# Patient Record
Sex: Female | Born: 1945 | Race: White | Hispanic: No | Smoking: Never smoker
Health system: Southern US, Community
[De-identification: ages and names within clinical notes are randomized; demographics above are authoritative.]

## PROBLEM LIST (undated history)

## (undated) DIAGNOSIS — E119 Type 2 diabetes mellitus without complications: Secondary | ICD-10-CM

## (undated) DIAGNOSIS — Z992 Dependence on renal dialysis: Secondary | ICD-10-CM

## (undated) DIAGNOSIS — E079 Disorder of thyroid, unspecified: Secondary | ICD-10-CM

## (undated) DIAGNOSIS — N189 Chronic kidney disease, unspecified: Secondary | ICD-10-CM

## (undated) DIAGNOSIS — N186 End stage renal disease: Secondary | ICD-10-CM

## (undated) DIAGNOSIS — E039 Hypothyroidism, unspecified: Secondary | ICD-10-CM

## (undated) DIAGNOSIS — I10 Essential (primary) hypertension: Secondary | ICD-10-CM

## (undated) HISTORY — DX: End stage renal disease: N18.6

---

## 2010-11-08 DIAGNOSIS — E1129 Type 2 diabetes mellitus with other diabetic kidney complication: Secondary | ICD-10-CM | POA: Diagnosis present

## 2020-11-24 DIAGNOSIS — N179 Acute kidney failure, unspecified: Secondary | ICD-10-CM | POA: Diagnosis present

## 2020-11-24 DIAGNOSIS — N184 Chronic kidney disease, stage 4 (severe): Secondary | ICD-10-CM | POA: Diagnosis present

## 2020-12-02 DIAGNOSIS — E11621 Type 2 diabetes mellitus with foot ulcer: Secondary | ICD-10-CM | POA: Diagnosis present

## 2021-02-11 ENCOUNTER — Encounter (HOSPITAL_COMMUNITY): Payer: Self-pay

## 2021-02-11 ENCOUNTER — Inpatient Hospital Stay (HOSPITAL_COMMUNITY): Payer: Medicare (Managed Care)

## 2021-02-11 ENCOUNTER — Emergency Department (HOSPITAL_COMMUNITY): Payer: Medicare (Managed Care)

## 2021-02-11 ENCOUNTER — Inpatient Hospital Stay (HOSPITAL_COMMUNITY)
Admission: EM | Admit: 2021-02-11 | Discharge: 2021-02-15 | DRG: 682 | Disposition: A | Discharge status: Home / Self Care | Payer: Medicare (Managed Care) | Attending: Internal Medicine | Admitting: Internal Medicine

## 2021-02-11 DIAGNOSIS — L97519 Non-pressure chronic ulcer of other part of right foot with unspecified severity: Secondary | ICD-10-CM | POA: Diagnosis present

## 2021-02-11 DIAGNOSIS — E11621 Type 2 diabetes mellitus with foot ulcer: Secondary | ICD-10-CM | POA: Diagnosis present

## 2021-02-11 DIAGNOSIS — N179 Acute kidney failure, unspecified: Principal | ICD-10-CM | POA: Diagnosis present

## 2021-02-11 DIAGNOSIS — I5032 Chronic diastolic (congestive) heart failure: Secondary | ICD-10-CM | POA: Diagnosis present

## 2021-02-11 DIAGNOSIS — K219 Gastro-esophageal reflux disease without esophagitis: Secondary | ICD-10-CM | POA: Diagnosis present

## 2021-02-11 DIAGNOSIS — L97509 Non-pressure chronic ulcer of other part of unspecified foot with unspecified severity: Secondary | ICD-10-CM | POA: Diagnosis present

## 2021-02-11 DIAGNOSIS — Z8673 Personal history of transient ischemic attack (TIA), and cerebral infarction without residual deficits: Secondary | ICD-10-CM

## 2021-02-11 DIAGNOSIS — F419 Anxiety disorder, unspecified: Secondary | ICD-10-CM | POA: Diagnosis present

## 2021-02-11 DIAGNOSIS — L03115 Cellulitis of right lower limb: Secondary | ICD-10-CM

## 2021-02-11 DIAGNOSIS — N3091 Cystitis, unspecified with hematuria: Secondary | ICD-10-CM | POA: Diagnosis present

## 2021-02-11 DIAGNOSIS — N184 Chronic kidney disease, stage 4 (severe): Secondary | ICD-10-CM | POA: Diagnosis present

## 2021-02-11 DIAGNOSIS — Z20822 Contact with and (suspected) exposure to covid-19: Secondary | ICD-10-CM | POA: Diagnosis present

## 2021-02-11 DIAGNOSIS — L97412 Non-pressure chronic ulcer of right heel and midfoot with fat layer exposed: Secondary | ICD-10-CM | POA: Diagnosis present

## 2021-02-11 DIAGNOSIS — W19XXXA Unspecified fall, initial encounter: Secondary | ICD-10-CM | POA: Diagnosis present

## 2021-02-11 DIAGNOSIS — E874 Mixed disorder of acid-base balance: Secondary | ICD-10-CM | POA: Diagnosis present

## 2021-02-11 DIAGNOSIS — E1122 Type 2 diabetes mellitus with diabetic chronic kidney disease: Secondary | ICD-10-CM | POA: Diagnosis present

## 2021-02-11 DIAGNOSIS — N390 Urinary tract infection, site not specified: Secondary | ICD-10-CM | POA: Diagnosis not present

## 2021-02-11 DIAGNOSIS — Z79890 Hormone replacement therapy (postmenopausal): Secondary | ICD-10-CM

## 2021-02-11 DIAGNOSIS — B3749 Other urogenital candidiasis: Secondary | ICD-10-CM | POA: Diagnosis present

## 2021-02-11 DIAGNOSIS — E118 Type 2 diabetes mellitus with unspecified complications: Secondary | ICD-10-CM | POA: Diagnosis not present

## 2021-02-11 DIAGNOSIS — E876 Hypokalemia: Secondary | ICD-10-CM | POA: Diagnosis not present

## 2021-02-11 DIAGNOSIS — G9341 Metabolic encephalopathy: Secondary | ICD-10-CM | POA: Diagnosis present

## 2021-02-11 DIAGNOSIS — G8929 Other chronic pain: Secondary | ICD-10-CM | POA: Diagnosis present

## 2021-02-11 DIAGNOSIS — N281 Cyst of kidney, acquired: Secondary | ICD-10-CM | POA: Diagnosis present

## 2021-02-11 DIAGNOSIS — N3090 Cystitis, unspecified without hematuria: Secondary | ICD-10-CM | POA: Diagnosis present

## 2021-02-11 DIAGNOSIS — R609 Edema, unspecified: Secondary | ICD-10-CM | POA: Diagnosis not present

## 2021-02-11 DIAGNOSIS — G250 Essential tremor: Secondary | ICD-10-CM | POA: Diagnosis present

## 2021-02-11 DIAGNOSIS — R2241 Localized swelling, mass and lump, right lower limb: Secondary | ICD-10-CM

## 2021-02-11 DIAGNOSIS — Z823 Family history of stroke: Secondary | ICD-10-CM

## 2021-02-11 DIAGNOSIS — E875 Hyperkalemia: Secondary | ICD-10-CM | POA: Diagnosis present

## 2021-02-11 DIAGNOSIS — Z7984 Long term (current) use of oral hypoglycemic drugs: Secondary | ICD-10-CM | POA: Diagnosis not present

## 2021-02-11 DIAGNOSIS — R319 Hematuria, unspecified: Secondary | ICD-10-CM

## 2021-02-11 DIAGNOSIS — Z79899 Other long term (current) drug therapy: Secondary | ICD-10-CM

## 2021-02-11 DIAGNOSIS — I130 Hypertensive heart and chronic kidney disease with heart failure and stage 1 through stage 4 chronic kidney disease, or unspecified chronic kidney disease: Secondary | ICD-10-CM | POA: Diagnosis present

## 2021-02-11 DIAGNOSIS — E872 Acidosis: Secondary | ICD-10-CM

## 2021-02-11 DIAGNOSIS — E039 Hypothyroidism, unspecified: Secondary | ICD-10-CM | POA: Diagnosis present

## 2021-02-11 DIAGNOSIS — E1129 Type 2 diabetes mellitus with other diabetic kidney complication: Secondary | ICD-10-CM

## 2021-02-11 DIAGNOSIS — H9190 Unspecified hearing loss, unspecified ear: Secondary | ICD-10-CM | POA: Diagnosis present

## 2021-02-11 DIAGNOSIS — Y929 Unspecified place or not applicable: Secondary | ICD-10-CM

## 2021-02-11 DIAGNOSIS — R197 Diarrhea, unspecified: Secondary | ICD-10-CM | POA: Diagnosis not present

## 2021-02-11 DIAGNOSIS — G929 Unspecified toxic encephalopathy: Secondary | ICD-10-CM | POA: Diagnosis present

## 2021-02-11 DIAGNOSIS — M86171 Other acute osteomyelitis, right ankle and foot: Secondary | ICD-10-CM | POA: Diagnosis present

## 2021-02-11 DIAGNOSIS — Z96651 Presence of right artificial knee joint: Secondary | ICD-10-CM | POA: Diagnosis present

## 2021-02-11 DIAGNOSIS — G9340 Encephalopathy, unspecified: Secondary | ICD-10-CM | POA: Diagnosis not present

## 2021-02-11 DIAGNOSIS — G253 Myoclonus: Secondary | ICD-10-CM | POA: Diagnosis present

## 2021-02-11 DIAGNOSIS — N189 Chronic kidney disease, unspecified: Secondary | ICD-10-CM

## 2021-02-11 DIAGNOSIS — T402X1A Poisoning by other opioids, accidental (unintentional), initial encounter: Secondary | ICD-10-CM | POA: Diagnosis present

## 2021-02-11 DIAGNOSIS — E1169 Type 2 diabetes mellitus with other specified complication: Secondary | ICD-10-CM | POA: Diagnosis present

## 2021-02-11 DIAGNOSIS — R3129 Other microscopic hematuria: Secondary | ICD-10-CM

## 2021-02-11 DIAGNOSIS — D638 Anemia in other chronic diseases classified elsewhere: Secondary | ICD-10-CM | POA: Diagnosis present

## 2021-02-11 DIAGNOSIS — L0390 Cellulitis, unspecified: Secondary | ICD-10-CM | POA: Diagnosis not present

## 2021-02-11 DIAGNOSIS — Z7902 Long term (current) use of antithrombotics/antiplatelets: Secondary | ICD-10-CM

## 2021-02-11 DIAGNOSIS — Z79891 Long term (current) use of opiate analgesic: Secondary | ICD-10-CM

## 2021-02-11 DIAGNOSIS — Z833 Family history of diabetes mellitus: Secondary | ICD-10-CM

## 2021-02-11 HISTORY — DX: Hypothyroidism, unspecified: E03.9

## 2021-02-11 HISTORY — DX: Disorder of thyroid, unspecified: E07.9

## 2021-02-11 HISTORY — DX: Type 2 diabetes mellitus without complications: E11.9

## 2021-02-11 HISTORY — DX: Chronic kidney disease, unspecified: N18.9

## 2021-02-11 HISTORY — DX: Essential (primary) hypertension: I10

## 2021-02-11 LAB — COMPREHENSIVE METABOLIC PANEL
ALT: 7 U/L (ref 0–44)
AST: 16 U/L (ref 15–41)
Albumin: 3 g/dL — ABNORMAL LOW (ref 3.5–5.0)
Alkaline Phosphatase: 50 U/L (ref 38–126)
Anion gap: 10 (ref 5–15)
BUN: 55 mg/dL — ABNORMAL HIGH (ref 8–23)
CO2: 13 mmol/L — ABNORMAL LOW (ref 22–32)
Calcium: 8.3 mg/dL — ABNORMAL LOW (ref 8.9–10.3)
Chloride: 113 mmol/L — ABNORMAL HIGH (ref 98–111)
Creatinine, Ser: 3.37 mg/dL — ABNORMAL HIGH (ref 0.44–1.00)
GFR, Estimated: 14 mL/min — ABNORMAL LOW (ref >60)
Glucose, Bld: 147 mg/dL — ABNORMAL HIGH (ref 70–99)
Potassium: 5.4 mmol/L — ABNORMAL HIGH (ref 3.5–5.1)
Sodium: 136 mmol/L (ref 135–145)
Total Bilirubin: 0.5 mg/dL (ref 0.3–1.2)
Total Protein: 6.4 g/dL — ABNORMAL LOW (ref 6.5–8.1)

## 2021-02-11 LAB — CBC WITH DIFFERENTIAL/PLATELET
Abs Immature Granulocytes: 0.04 10*3/uL (ref 0.00–0.07)
Basophils Absolute: 0.1 10*3/uL (ref 0.0–0.1)
Basophils Relative: 1 %
Eosinophils Absolute: 0.1 10*3/uL (ref 0.0–0.5)
Eosinophils Relative: 1 %
HCT: 30.9 % — ABNORMAL LOW (ref 36.0–46.0)
Hemoglobin: 9.5 g/dL — ABNORMAL LOW (ref 12.0–15.0)
Immature Granulocytes: 0 %
Lymphocytes Relative: 15 %
Lymphs Abs: 1.6 10*3/uL (ref 0.7–4.0)
MCH: 30 pg (ref 26.0–34.0)
MCHC: 30.7 g/dL (ref 30.0–36.0)
MCV: 97.5 fL (ref 80.0–100.0)
Monocytes Absolute: 0.8 10*3/uL (ref 0.1–1.0)
Monocytes Relative: 7 %
Neutro Abs: 8.2 10*3/uL — ABNORMAL HIGH (ref 1.7–7.7)
Neutrophils Relative %: 76 %
Platelets: 233 10*3/uL (ref 150–400)
RBC: 3.17 MIL/uL — ABNORMAL LOW (ref 3.87–5.11)
RDW: 14.4 % (ref 11.5–15.5)
WBC: 10.8 10*3/uL — ABNORMAL HIGH (ref 4.0–10.5)
nRBC: 0 % (ref 0.0–0.2)

## 2021-02-11 LAB — URINALYSIS, ROUTINE W REFLEX MICROSCOPIC
Bilirubin Urine: NEGATIVE
Glucose, UA: >=500 mg/dL — AB
Ketones, ur: NEGATIVE mg/dL
Nitrite: NEGATIVE
Protein, ur: >=300 mg/dL — AB
Specific Gravity, Urine: 1.009 (ref 1.005–1.030)
WBC, UA: >50 WBC/hpf — ABNORMAL HIGH (ref 0–5)
pH: 5 (ref 5.0–8.0)

## 2021-02-11 LAB — I-STAT ARTERIAL BLOOD GAS, ED
Acid-base deficit: 16 mmol/L — ABNORMAL HIGH (ref 0.0–2.0)
Bicarbonate: 11.2 mmol/L — ABNORMAL LOW (ref 20.0–28.0)
Calcium, Ion: 1.23 mmol/L (ref 1.15–1.40)
HCT: 24 % — ABNORMAL LOW (ref 36.0–46.0)
Hemoglobin: 8.2 g/dL — ABNORMAL LOW (ref 12.0–15.0)
O2 Saturation: 89 %
Patient temperature: 98.8
Potassium: 5.1 mmol/L (ref 3.5–5.1)
Sodium: 139 mmol/L (ref 135–145)
TCO2: 12 mmol/L — ABNORMAL LOW (ref 22–32)
pCO2 arterial: 32.2 mmHg (ref 32.0–48.0)
pH, Arterial: 7.152 — CL (ref 7.350–7.450)
pO2, Arterial: 72 mmHg — ABNORMAL LOW (ref 83.0–108.0)

## 2021-02-11 LAB — I-STAT VENOUS BLOOD GAS, ED
Acid-base deficit: 14 mmol/L — ABNORMAL HIGH (ref 0.0–2.0)
Bicarbonate: 13.4 mmol/L — ABNORMAL LOW (ref 20.0–28.0)
Calcium, Ion: 1.18 mmol/L (ref 1.15–1.40)
HCT: 24 % — ABNORMAL LOW (ref 36.0–46.0)
Hemoglobin: 8.2 g/dL — ABNORMAL LOW (ref 12.0–15.0)
O2 Saturation: 75 %
Potassium: 5.1 mmol/L (ref 3.5–5.1)
Sodium: 140 mmol/L (ref 135–145)
TCO2: 15 mmol/L — ABNORMAL LOW (ref 22–32)
pCO2, Ven: 38.4 mmHg — ABNORMAL LOW (ref 44.0–60.0)
pH, Ven: 7.15 — CL (ref 7.250–7.430)
pO2, Ven: 51 mmHg — ABNORMAL HIGH (ref 32.0–45.0)

## 2021-02-11 LAB — CK: Total CK: 376 U/L — ABNORMAL HIGH (ref 38–234)

## 2021-02-11 LAB — SEDIMENTATION RATE: Sed Rate: 16 mm/hr (ref 0–22)

## 2021-02-11 LAB — HEMOGLOBIN A1C
Hgb A1c MFr Bld: 6.8 % — ABNORMAL HIGH (ref 4.8–5.6)
Mean Plasma Glucose: 148.46 mg/dL

## 2021-02-11 LAB — LACTIC ACID, PLASMA: Lactic Acid, Venous: 0.7 mmol/L (ref 0.5–1.9)

## 2021-02-11 MED ORDER — HEPARIN SODIUM (PORCINE) 5000 UNIT/ML IJ SOLN
5000.0000 [IU] | Freq: Three times a day (TID) | INTRAMUSCULAR | Status: DC
  Administered 2021-02-11 – 2021-02-14 (×10): 5000 [IU] via SUBCUTANEOUS
  Filled 2021-02-11 (×11): qty 1

## 2021-02-11 MED ORDER — SODIUM CHLORIDE 0.9 % IV SOLN
2.0000 g | INTRAVENOUS | Status: DC
  Administered 2021-02-12: 2 g via INTRAVENOUS
  Filled 2021-02-11: qty 20

## 2021-02-11 MED ORDER — ACETAMINOPHEN 325 MG PO TABS: 650.0000 mg | ORAL_TABLET | Freq: Four times a day (QID) | ORAL | Status: DC | PRN

## 2021-02-11 MED ORDER — LACTATED RINGERS IV SOLN: INTRAVENOUS | Status: DC

## 2021-02-11 MED ORDER — SODIUM CHLORIDE 0.9 % IV SOLN
1.0000 g | Freq: Once | INTRAVENOUS | Status: AC
  Administered 2021-02-11: 1 g via INTRAVENOUS
  Filled 2021-02-11: qty 10

## 2021-02-11 MED ORDER — METRONIDAZOLE 500 MG/100ML IV SOLN
500.0000 mg | Freq: Three times a day (TID) | INTRAVENOUS | Status: DC
  Administered 2021-02-11 – 2021-02-13 (×6): 500 mg via INTRAVENOUS
  Filled 2021-02-11 (×6): qty 100

## 2021-02-11 MED ORDER — ACETAMINOPHEN 650 MG RE SUPP: 650.0000 mg | Freq: Four times a day (QID) | RECTAL | Status: DC | PRN

## 2021-02-11 MED ORDER — SODIUM CHLORIDE 0.9 % IV BOLUS
1000.0000 mL | Freq: Once | INTRAVENOUS | Status: AC
  Administered 2021-02-11: 1000 mL via INTRAVENOUS

## 2021-02-11 MED ORDER — VANCOMYCIN HCL 1250 MG/250ML IV SOLN
1250.0000 mg | Freq: Once | INTRAVENOUS | Status: AC
  Administered 2021-02-11: 1250 mg via INTRAVENOUS
  Filled 2021-02-11: qty 250

## 2021-02-11 MED ORDER — VANCOMYCIN VARIABLE DOSE PER UNSTABLE RENAL FUNCTION (PHARMACIST DOSING): Status: DC

## 2021-02-11 NOTE — ED Notes (Addendum)
Patient transported to x-ray. ?

## 2021-02-11 NOTE — ED Notes (Signed)
Unable to obtain red top of 2nd blood culture.

## 2021-02-11 NOTE — ED Provider Notes (Signed)
[facility] EMERGENCY DEPARTMENT Provider Note   CSN: [id number] Arrival date & time: 02/11/21  1449     History Chief Complaint  Patient presents with  . Fall    Molly Arroyo is a 75 y.o. female.  The history is provided by the patient and medical records.  Fall   Molly Arroyo is a 75 y.o. female who presents to the Emergency Department complaining of fall. Level V caveat due to confusion. Per report she was last known well at 74 yesterday. The roommate noted that she was leaning to the right prior to going to bed and she had an unwitnessed fall. Daughter called EMS when she was found to be leaning to the side today. Patient complains of chronic back pain, no additional complaints.    Past Medical History:  Diagnosis Date  . Diabetes mellitus without complication ([place])   . Hypertension   . Thyroid disease     Patient Active Problem List   Diagnosis Date Noted  . Acute encephalopathy 02/11/2021  . Microscopic hematuria 02/11/2021  . Urinary tract infection 02/11/2021  . Diabetic ulcer of right great toe ([place]) 02/11/2021  . Cellulitis of right lower extremity 02/11/2021  . Diabetic ulcer of right midfoot associated with type 2 diabetes mellitus, with fat layer exposed ([place]) 12/02/2020  . Acute renal failure superimposed on stage 4 chronic kidney disease ([place]) 11/24/2020  . 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     No family history on file.  Social History   Substance Use Topics  . Alcohol use: Never  . Drug use: Never    Home Medications Prior to Admission medications   Not on File    Allergies    Patient has no known allergies.  Review of Systems   Review of Systems  All other systems reviewed and are negative.   Physical Exam Updated Vital Signs BP (!) 120/56 (BP Location: Right Arm)   Pulse 65   Temp 98.8 F (37.1 C) (Oral)   Resp 18    SpO2 98%   Physical Exam Vitals and nursing note reviewed.  Constitutional:      Appearance: She is well-developed.  HENT:     Head: Normocephalic and atraumatic.     Comments: Dry mucous membranes Cardiovascular:     Rate and Rhythm: Normal rate and regular rhythm.     Heart sounds: No murmur heard.   Pulmonary:     Effort: Pulmonary effort is normal. No respiratory distress.     Breath sounds: Normal breath sounds.  Abdominal:     Palpations: Abdomen is soft.     Tenderness: There is no abdominal tenderness. There is no guarding or rebound.  Musculoskeletal:        General: No tenderness.     Comments: 2+ DP pulses bilaterally. There is no bony tenderness to the lower extremities bilaterally. There is trace edema to the right lower extremity  Skin:    General: Skin is warm and dry.  Neurological:     Mental Status: She is alert.     Comments: Oriented to person in place. Confused. Slow to answer questions. Inappropriate answers at times. No asymmetry of facial movements. There is five out of five grip strength bilaterally. There is 4/5 strength to bilateral lower extremities.  Psychiatric:        Behavior: Behavior normal.     ED  Results / Procedures / Treatments   Labs (all labs ordered are listed, but only abnormal results are displayed) Labs Reviewed  COMPREHENSIVE METABOLIC PANEL - Abnormal; Notable for the following components:      Result Value   Potassium 5.4 (*)    Chloride 113 (*)    CO2 13 (*)    Glucose, Bld 147 (*)    BUN 55 (*)    Creatinine, Ser 3.37 (*)    Calcium 8.3 (*)    Total Protein 6.4 (*)    Albumin 3.0 (*)    GFR, Estimated 14 (*)    All other components within normal limits  CBC WITH DIFFERENTIAL/PLATELET - Abnormal; Notable for the following components:   WBC 10.8 (*)    RBC 3.17 (*)    Hemoglobin 9.5 (*)    HCT 30.9 (*)    Neutro Abs 8.2 (*)    All other components within normal limits  URINALYSIS, ROUTINE W REFLEX MICROSCOPIC -  Abnormal; Notable for the following components:   APPearance TURBID (*)    Glucose, UA >=500 (*)    Hgb urine dipstick MODERATE (*)    Protein, ur >=300 (*)    Leukocytes,Ua LARGE (*)    WBC, UA >50 (*)    Bacteria, UA MANY (*)    Non Squamous Epithelial 0-5 (*)    All other components within normal limits  CK - Abnormal; Notable for the following components:   Total CK 376 (*)    All other components within normal limits  HEMOGLOBIN A1C - Abnormal; Notable for the following components:   Hgb A1c MFr Bld 6.8 (*)    All other components within normal limits  I-STAT VENOUS BLOOD GAS, ED - Abnormal; Notable for the following components:   pH, Ven 7.150 (*)    pCO2, Ven 38.4 (*)    pO2, Ven 51.0 (*)    Bicarbonate 13.4 (*)    TCO2 15 (*)    Acid-base deficit 14.0 (*)    HCT 24.0 (*)    Hemoglobin 8.2 (*)    All other components within normal limits  I-STAT ARTERIAL BLOOD GAS, ED - Abnormal; Notable for the following components:   pH, Arterial 7.152 (*)    pO2, Arterial 72 (*)    Bicarbonate 11.2 (*)    TCO2 12 (*)    Acid-base deficit 16.0 (*)    HCT 24.0 (*)    Hemoglobin 8.2 (*)    All other components within normal limits  URINE CULTURE  SARS CORONAVIRUS 2 (TAT 6-24 HRS)  CULTURE, BLOOD (ROUTINE X 2)  CULTURE, BLOOD (ROUTINE X 2)  SEDIMENTATION RATE  C-REACTIVE PROTEIN  LACTIC ACID, PLASMA  LACTIC ACID, PLASMA  BLOOD GAS, ARTERIAL  RAPID URINE DRUG SCREEN, HOSP PERFORMED  SODIUM, URINE, RANDOM  CREATININE, URINE, RANDOM    EKG EKG Interpretation  Date/Time:  Sunday Feb 11 2021 15:08:00 EDT Ventricular Rate:  63 PR Interval:  138 QRS Duration: 84 QT Interval:  406 QTC Calculation: 415 R Axis:   65 Text Interpretation: Normal sinus rhythm Normal ECG Confirmed by Quintella Reichert [id number]) on 02/11/2021 3:13:08 PM   Radiology DG Chest 2 View  Result Date: 02/11/2021 CLINICAL DATA:  Pain after fall. Fall walking to the bathroom in the middle the night. EXAM:  CHEST - 2 VIEW COMPARISON:  None available. FINDINGS: Elevated right hemidiaphragm with mild volume loss in the right hemithorax. Heart size normal for degree of inspiration. Aortic atherosclerosis and tortuosity. There are  streaky bibasilar opacities. Peribronchial thickening. No pleural effusion or pneumothorax. Bones diffusely under mineralized. Chronic change of both shoulders. Possible left anterior sixth rib fracture, age indeterminate. IMPRESSION: 1. Possible left anterior sixth rib fracture, age indeterminate. 2. Elevated right hemidiaphragm. Streaky bibasilar opacities may represent atelectasis or scarring. Aortic Atherosclerosis (ICD10-I70.0). Electronically Signed   By: Keith Rake M.D.   On: 02/11/2021 16:17   DG Pelvis 1-2 Views  Result Date: 02/11/2021 CLINICAL DATA:  Pain after fall. Fall walking to the bathroom in the middle the night. EXAM: PELVIS - 1-2 VIEW COMPARISON:  None available. FINDINGS: The bones are diffusely under mineralized. In conjunction with soft tissue attenuation from habitus, evaluation for acute fracture is limited. Allowing for this, no fracture is seen. Both femoral heads are well seated in the acetabulum. Pubic rami appear intact. Pubic symphysis and sacroiliac joints are congruent. Degenerative change in the included lower lumbar spine. IMPRESSION: No fracture of the pelvis.  Underlying osteopenia/osteoporosis. Electronically Signed   By: Keith Rake M.D.   On: 02/11/2021 16:18   CT Head Wo Contrast  Result Date: 02/11/2021 CLINICAL DATA:  Mental status change. Patient is leaning to the right. Fall going to the bathroom last night. EXAM: CT HEAD WITHOUT CONTRAST TECHNIQUE: Contiguous axial images were obtained from the base of the skull through the vertex without intravenous contrast. COMPARISON:  None. FINDINGS: Brain: No evidence of acute infarction, hemorrhage, hydrocephalus, extra-axial collection or mass lesion/mass effect. Brain volume is normal for  age. There is mild periventricular and deep white matter hypodensity, typically chronic small vessel ischemia. Vascular: Atherosclerosis of skullbase vasculature without hyperdense vessel or abnormal calcification. Skull: No fracture or focal lesion. Sinuses/Orbits: Paranasal sinuses and mastoid air cells are clear. The visualized orbits are unremarkable. Bilateral cataract resection. Other: None. IMPRESSION: 1. No acute intracranial abnormality. No skull fracture. 2. Mild chronic small vessel ischemia. Electronically Signed   By: Keith Rake M.D.   On: 02/11/2021 16:26   MR BRAIN WO CONTRAST  Result Date: 02/11/2021 CLINICAL DATA:  Fall with loss of balance EXAM: MRI HEAD WITHOUT CONTRAST TECHNIQUE: Multiplanar, multiecho pulse sequences of the brain and surrounding structures were obtained without intravenous contrast. COMPARISON:  None. FINDINGS: Brain: No acute infarct, mass effect or extra-axial collection. No acute or chronic hemorrhage. Normal white matter signal, parenchymal volume and CSF spaces. The midline structures are normal. Vascular: Major flow voids are preserved. Skull and upper cervical spine: Normal calvarium and skull base. Visualized upper cervical spine and soft tissues are normal. Sinuses/Orbits:No paranasal sinus fluid levels or advanced mucosal thickening. No mastoid or middle ear effusion. Normal orbits. IMPRESSION: Normal brain MRI. Electronically Signed   By: Ulyses Jarred M.D.   On: 02/11/2021 22:34   DG Foot 2 Views Right  Result Date: 02/11/2021 CLINICAL DATA:  Dorsal right foot ulcer. EXAM: RIGHT FOOT - 2 VIEW COMPARISON:  None. FINDINGS: The second, fourth and fifth right toes are flexed in position and subsequently limited in evaluation. The phalanges of the third right toe are likely surgically absent. A small cortical defect is seen along the medial aspect of the base of the distal phalanx of the right great toe. An adjacent 7 mm x 3 mm right great toe soft tissue  ulceration is noted. Benign-appearing periarticular lucent areas are seen involving the right great toe, with chronic deformities involving the base of the first right metatarsal and distal aspect of the second right metatarsal. A small to moderate sized plantar calcaneal spur is noted  with marked severity degenerative changes seen within the mid right foot. IMPRESSION: 1. Right great toe ulceration with a small area of suspected acute osteomyelitis. MRI correlation is recommended. 2. Chronic, postoperative and degenerative changes throughout the right foot, as described above. Electronically Signed   By: Virgina Norfolk M.D.   On: 02/11/2021 22:20    Procedures Procedures   Medications Ordered in ED Medications  heparin injection 5,000 Units (5,000 Units Subcutaneous Given 02/11/21 2306)  acetaminophen (TYLENOL) tablet 650 mg (has no administration in time range)    Or  acetaminophen (TYLENOL) suppository 650 mg (has no administration in time range)  vancomycin (VANCOREADY) IVPB 1250 mg/250 mL (1,250 mg Intravenous New Bag/Given 02/11/21 2241)  vancomycin variable dose per unstable renal function (pharmacist dosing) (has no administration in time range)  cefTRIAXone (ROCEPHIN) 2 g in sodium chloride 0.9 % 100 mL IVPB (has no administration in time range)  metroNIDAZOLE (FLAGYL) IVPB 500 mg (500 mg Intravenous New Bag/Given 02/11/21 2305)  lactated ringers infusion ( Intravenous New Bag/Given 02/11/21 2304)  cefTRIAXone (ROCEPHIN) 1 g in sodium chloride 0.9 % 100 mL IVPB (0 g Intravenous Stopped 02/11/21 1810)  sodium chloride 0.9 % bolus 1,000 mL (1,000 mLs Intravenous New Bag/Given 02/11/21 1844)    ED Course  I have reviewed the triage vital signs and the nursing notes.  Pertinent labs & imaging results that were available during my care of the patient were reviewed by me and considered in my medical decision making (see chart for details).    MDM Rules/Calculators/A&P                          patient here for evaluation following a fall at home, we need to the right. Patient confused, dehydrated appearing on evaluation. Attempted to contact family, no answer when called. UA is concerning for UTI and labs demonstrate AK I. She was treated with antibiotics, IV fluid hydration. Plan to admit for further workup for her symptoms.   Final Clinical Impression(s) / ED Diagnoses Final diagnoses:  AKI (acute kidney injury) ([place])  Acute UTI  Fall, initial encounter  Foot ulcer ([place])  Localized swelling of right lower extremity    Rx / DC Orders ED Discharge Orders    None       Quintella Reichert, MD 02/11/21 2340

## 2021-02-11 NOTE — ED Triage Notes (Signed)
LKW 1900 yesterday. Roommate noted patient leaning to right prior to going to bed. Patient reported unwitnessed fall, lost her balance walking to BR middle of the night, denies hitting head or LOC, denies pain other than chronic back pain. Daughter noted patient also leaning to right side today when she came over and called EMS. Stroke assessment negative, EKG normal. Patient takes oxycodone and morphine for back pain.

## 2021-02-11 NOTE — ED Notes (Addendum)
Ambulated to BR using walker with 2 assist. Right leaning while walking and had to be physically directed left. She states she can tell she is leaning to the right while walking and stated, "I think I had a stroke". Urine collected. MD notified.

## 2021-02-11 NOTE — H&P (Addendum)
Date: 02/11/2021               Patient Name:  Molly Arroyo MRN: [id number]  DOB: [birth year] Age / Sex: 75 y.o., female   PCP: No primary care provider on file.         Medical Service: Internal Medicine Teaching Service         Attending Physician: Dr. Aldine Contes    First Contact: Dr. Jeralyn Bennett Pager: [phone]  Second Contact: Dr. Harvie Heck Pager: [phone]       After Hours (After 5p/  First Contact Pager: [phone]  weekends / holidays): Second Contact Pager: [phone]   Chief Complaint: fall  History of Present Illness:   Molly Arroyo is a 75 y.o. woman with a past medical history significant for type 2 diabetes, hypertension, stage IV CKD, CVA, recent hospitalization 11/23/20-12/02/20 for septic shock secondary to RLE cellulitis complicated by hypoxemic respiratory failure and AKI, mild cognitive impairment, spinal stenosis, chronic pain on chronic opioids, HFpEF, hypothyroidism, GERD, R knee replacement who presents from home with acute encephalopathy.  History provided by patient's daughter and chart review. Patient unable provide history secondary to acute encephalopathy.  Per the patient's daughter, Molly Arroyo, patient lives with an 66 year old friend, Molly Arroyo, who reported the patient was in her usual state of health until around 7-8 pm yesterday (5/21) when she found the patient slumped to her right side and sleeping on the couch. She spoke with the patient who responded appropriately however her responses were noted to be slower than normal. She got the patient to bed, and in the morning she found the patient similarly slumped over. She continued to have slow responses and so asked the patient's daughter to evaluate the patient, and the daughter called EMS. Patient told providers in triage she had an unwitnessed fall while walking to the bathroom in the middle of the night while, denied hitting head or LOC.  On evaluation in the ED, the patient is sleeping however  arouses to voice. Her responses are slow, and she is initially oriented to self and year however states we are at [facility] and cannot describe the reason for coming to the hospital beyond, "I got sick." She is somnolent though consistently arousable to voice and later in the interview is unable provide answers to questions, including whether she is in any pain or discomfort.  On chart review, the patient was admitted at [facility] 11/23/20-12/02/20 where she treated in ICU for septic shock secondary to RLE cellulitis which was complicated by hypoxemic respiratory failure and persistent oxygen requirement suspected to be from atelectasis. During this hospitalization she was also treated for AKI on CKD (sCr 4.65 on admission from baseline of 2, improved to 2.27 by day of discharge. She was discharged home with home health.  Per the daughter, the patient has been "sharp and with it" since discharge home, although she has been generally weaker. Reports someone does wound care every 2-3 days, and she changes the patient's dressings every other day. The patient ambulates with a walker, feeds and bathes herself, manages her own medications. The daughter is not aware of the patient being in increased pain lately or complaining of urinary symptoms such as dysuria.   ED Course: Afebrile (Tmax 99.1), P 60s, RR 17, BP 118-120/40s-50s, SpO2 >97% on room air. CBC with leukocytosis (10.8), normocytic anemia (hgb 9.5 with MCV 97.5), plts 233k. CMP with Na 136, K 5.4, bicarb 13, BUN/Cr 55/3.37 (baseline creatinine 1.8-2), Ca 8.3, Tprot  6.4, Alb 3, AST/ALT/ALP wnl. CK 376. istat VBG with pH 7.15 and PCO2 38.4. U/A with large leuk (>50), many bacteria, negative nitrite, moderate hemoglobin (11-20), glucose >500, protein >300. CT head with mild chronic small vessel disease and negative for acute changes. CXR with age indetermine possible L anterior 6th rib fracture, elevated R hemidiaphragm and streaky bibasilar opacities. Given  ceftriaxone and 1 L bolus NS. IMTS consulted for admission.  Meds:  From Care Everywhere: Albuterol HFA 90 mcg per actuation inhaler- inhale 2 puffs every 4 hours as needed for wheezing Levothyroxine 100 mcg tablet daily BuSpar 10 mg 3 times daily Paxil 40 mg daily Requip 1 mg each night Amlodipine 10 mg daily Plavix 75 mg tablet daily Vitamin D3- 1250 mcg, 50,000 units every 7 days Atenolol 50 mg tablet daily Famotidine 20 mg daily Invokana 100 mg daily Morphine (MS Contin) 30 mg 12-hour tablet twice daily Oxycodone 5 mg 1 instant release tablet 2 times daily as needed  Allergies: Allergies as of 02/11/2021  . (No Known Allergies)   Past Medical History:  Diagnosis Date  . Diabetes mellitus without complication ([place])   . Hypertension   . Thyroid disease     Family History:  Per Care Everywhere: Mother: diabetes, stroke Father: no known medical problems Sister: brain aneurysm, breast cancer (29 yo)  Maternal grandmother: cervical cancer Son: MI, bipolar disorder   Social History: Patient lives with her 69 year old friend, Environmental education officer. Has two children, a son and daughter. Per daughter, patient has no history of smoking, alcohol use, or illicit drug use.   Review of Systems: A complete ROS was unable to be obtained secondary to patient's acute encephalopathy.   Physical Exam: Blood pressure (!) 120/56, pulse 65, temperature 98.8 F (37.1 C), temperature source Oral, resp. rate 18, SpO2 98 %. Constitutional: chronically-ill-appearing woman lying towards her right side in ED stretcher, in no acute distress HENT: normocephalic atraumatic Eyes: conjunctiva non-erythematous, no scleral icterus Neck: supple Cardiovascular: regular rate and rhythm, no m/r/g, 1+ pitting edema in the LLE to the knee, 2+ pitting edema in the RLE to the knee, unable to palpate DP pulses bilaterally (exam limited due to patient's myoclonus) Pulmonary/Chest: normal work of breathing on room air, lungs  clear to auscultation bilaterally though exam limited by patient's position Abdominal: soft, non-distended; no guarding with palpation of abdomen in all four quadrants and suprapubically MSK: 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 Intermittently follows commands. Pupils 3 mm and equally reactive to light. No facial asymmetry. Diffuse myoclonus. Moves all extremities spontaneously. Diffuse myoclonus.  Skin: warm and dry; R foot with 2 ulcers: one on anterior medial aspect with scant drainage on dressing, mild surrounding erythema; another ulcer on dorsal medial aspect of R great toe with purulent drainage and surrounding erythema. Patient does not withdraw with manipulation or palpation of foot. See pictures below. Psych: somnolent, innatentive      EKG Interpretation  Date/Time:  Sunday Feb 11 2021 15:08:00 EDT Ventricular Rate:  63 PR Interval:  138 QRS Duration: 84 QT Interval:  406 QTC Calculation: 415 R Axis:   65 Text Interpretation: Normal sinus rhythm Normal ECG Confirmed by Quintella Reichert [phone]) on 02/11/2021 3:13:08 PM  Imaging  DG Chest 2 View  Result Date: 02/11/2021 CLINICAL DATA:  Pain after fall. Fall walking to the bathroom in the middle the night. EXAM: CHEST -  2 VIEW COMPARISON:  None available. FINDINGS: Elevated right hemidiaphragm with mild volume loss in the right hemithorax. Heart size normal for degree of inspiration. Aortic atherosclerosis and tortuosity. There are streaky bibasilar opacities. Peribronchial thickening. No pleural effusion or pneumothorax. Bones diffusely under mineralized. Chronic change of both shoulders. Possible left anterior sixth rib fracture, age indeterminate. IMPRESSION: 1. Possible left anterior sixth rib fracture, age indeterminate. 2. Elevated right hemidiaphragm. Streaky  bibasilar opacities may represent atelectasis or scarring. Aortic Atherosclerosis (ICD10-I70.0). Electronically Signed   By: Keith Rake M.D.   On: 02/11/2021 16:17   DG Pelvis 1-2 Views  Result Date: 02/11/2021 CLINICAL DATA:  Pain after fall. Fall walking to the bathroom in the middle the night. EXAM: PELVIS - 1-2 VIEW COMPARISON:  None available. FINDINGS: The bones are diffusely under mineralized. In conjunction with soft tissue attenuation from habitus, evaluation for acute fracture is limited. Allowing for this, no fracture is seen. Both femoral heads are well seated in the acetabulum. Pubic rami appear intact. Pubic symphysis and sacroiliac joints are congruent. Degenerative change in the included lower lumbar spine. IMPRESSION: No fracture of the pelvis.  Underlying osteopenia/osteoporosis. Electronically Signed   By: Keith Rake M.D.   On: 02/11/2021 16:18   CT Head Wo Contrast  Result Date: 02/11/2021 CLINICAL DATA:  Mental status change. Patient is leaning to the right. Fall going to the bathroom last night. EXAM: CT HEAD WITHOUT CONTRAST TECHNIQUE: Contiguous axial images were obtained from the base of the skull through the vertex without intravenous contrast. COMPARISON:  None. FINDINGS: Brain: No evidence of acute infarction, hemorrhage, hydrocephalus, extra-axial collection or mass lesion/mass effect. Brain volume is normal for age. There is mild periventricular and deep white matter hypodensity, typically chronic small vessel ischemia. Vascular: Atherosclerosis of skullbase vasculature without hyperdense vessel or abnormal calcification. Skull: No fracture or focal lesion. Sinuses/Orbits: Paranasal sinuses and mastoid air cells are clear. The visualized orbits are unremarkable. Bilateral cataract resection. Other: None. IMPRESSION: 1. No acute intracranial abnormality. No skull fracture. 2. Mild chronic small vessel ischemia. Electronically Signed   By: Keith Rake M.D.   On:  02/11/2021 16:26    Assessment & Plan by Problem: Principal Problem:   Acute encephalopathy Active Problems:   Acute renal failure superimposed on stage 4 chronic kidney disease (HCC)   Type 2 diabetes mellitus with renal complication (HCC)   Microscopic hematuria   Urinary tract infection   Diabetic ulcer of right midfoot associated with type 2 diabetes mellitus, with fat layer exposed ([place])   Diabetic ulcer of right great toe ([place])   Cellulitis of right lower extremity  Lanyah Spengler is a 75 y.o. woman with a past medical history significant for type 2 diabetes, hypertension, CKD stage IV, recent hospitalization 11/23/20-12/02/20 for septic shock secondary to RLE cellulitis complicated by hypoxemic respiratory failure and AKI, chronic pain on chronic opioids, CVA, mild cognitive impairment, spinal stenosis, HFpEF, hypothyroidism, GERD, R knee replacement who presents from home with acute encephalopathy and admitted for further evaluation and management of suspected multifactorial acute metabolic and toxic encephalopathy as well as AKI on CKD.  Acute metabolic +/- toxic encephalopathy, acute right sided weakness, unwitnessed fall Last known normal 7pm the day prior to admission (5/21). Found by roommate leaning to the right, slowed responses. Patient reported to ED triage unwitnessed fall in the middle of the night without hitting head or LOC. Per daughter, patient is typically oriented x 3, though has been overall weaker and now ambulates with  walker since recent hospitalization. On exam, patient is afebrile and hemodynamically stable. She is somnolent though arousable to voice. Oriented to self, responses are slow, intermittently follows commands. Exam also notable for diffuse myoclonus, purulent ulcer on R great toe with surrounding erythema. No guarding with abdominal palpation. Neuro exam limited by patient's encephalopathy though appears to move all extremities equally. Head CT unremarkable  for acute trauma, bleed or infarct however given the reported acute right sided weakness, will obtain MRI. Given diffuse myoclonus, will obtain EEG to evaluate for possible subclinical seizure. She is on several centrally acting agents including buspar, paxil, requip, morphine and oxycodone. She is also on atenolol so bradycardia is a possiblity. Furthermore, she is on antihyperglycemic agents which could have resulted in hypoglycemia. UTI +/- purulent RLE cellulitis is also likely playing a role. Uremia also considered. She has an AKI on CK4 which may be prerenal from dehydration therefore may also have resulted in orthostasis. Since her renal function has declined, the morphine and oxycodone may not be filtered as usual, so may have built up further in her system. - NPO, holding centrally-acting medications - Treatment of UTI and cellulitis, as below - MRI, EEG - Lactic acid  - UDS - Delirium precautions, fall precautions  AKI on CKD stage 4 Mild hyperkalemia Non-anion gap metabolic acidosis On admission, Na 136, K 5.4, bicarb 13, BUN/Cr 55/3.37 (baseline creatinine 1.8-2). CK 376. I-stat VBG with pH 7.150, pCO2 38.4. During recent hospitalization, serum creatinine noted to be markedly elevated to 4.65 on admission however improved to 2.27 by day of discharge (12/02/20). Last creatinine 2.01 on 12/29/20. Will obtain urine sodium and creatinine to calculate FeNa. Patient does not appear significantly dry on exam, however given elevated BUN, suspect some pre-renal component, so will continue gentle fluids. Based on Winter's formula, seems to be small degree of superimposed respiratory acidosis which may be secondary to oxy use in setting of worsened kidney function. NAGMA likely secondary to renal insufficiency. Will also obtain lactic acid. Given severe acidosis will start fluids with bicarb. - s/p 1 L NS in the ED - sodium bicarb 150 mEq in sterile water at 100 cc/hr - Lactic acid - Urine sodium, urine  creatinine - renal ultrasound - ABG - avoid nephrotoxic medications, appreciate pharmacy's assistance with dosing vancomycin  Urinary tract infection U/A with large leuk (>50), many bacteria, negative nitrite, moderate hemoglobin (11-20), glucose >500, protein >300. Patient was recently restarted on SGLT2i Invokana. Abdominal exam limited due to patient's acute encephalopathy, though no rebound with palpation of suprapubic area. - follow urine culture - continue rocephin  Purulent right lower extremity cellulitis secondary to chronic diabetic foot ulcer Per Care Everywhere, it appears that this resulted in hospitalization requiring ICU level care at [facility] 11/23/20-12/02/20. On exam, the right foot has 2 ulcerations, one on the medial dorsal aspect of the great toe and the other on the dorsal foot. The wounds are malodorous and with brown/yellow colored drainage. In addition, given inability to palpate DP pulses, will evaluate with ABI. Obtaining RLE duplex to rule out possible DVT in setting of R>L pitting edema. - check ESR/CRP - will start with an xray of the right foot to look for osseous changes - antibiotic therapy with vanc per pharmacy and ceftriaxone - follow blood cultures - lactic acid  - ABIs - R lower extremity venous doppler  Microscopic hematuria  Suspect related to possible UTI but would recommend repeat in 4-6 weeks.  Type 2 diabetes mellitus Serum glucose  147 on admission. Last A1c 6.9% on 11/28/20. On invokana, januvia, and glipizide - Holding SSI given NPO  Chronic pain on chronic opioids Spinal stenosis On Morphine 30 mg twice daily and oxycodone 5 mg twice daily as needed. Per most PCP note in Care Everywhere from 12/29/20, patient's PCP expressed concern patient may be taking more frequently than prescribed. - Holding home morphine and oxy in setting of acute encephalopathy - UDS  Normocytic anemia Hemoglobin 9.5 with MCV 97.5 on admission. Unclear baseline, 7.9 on day  of discharge from last hospitalization (12/02/20).  Last iron studies during 11/2020 hospitalization showed ferritin 487, iron 15, Tsat 14. Suspect secondary to anemia of chronic disease. - Daily CBC   HTN Normotensive on admission. On amlodipine 10 mg, lisinopril 40 mg, and atenolol 50 mg daily at home. Holding in setting of normotension, AKI, and given current NPO in setting of acute encephalopathy.  HFpEF (G1DD) Last formal echo 06/2017 showed LVEF 60-65% with grade 1 diastolic dysfunction. Patient previously on Lasix however was held during recent hospitalization and has since not been resumed. Bilateral lower extremity edema, R>L. Lungs clear on limited exam.  - Strict I/O, daily weights  Hypothyroidism Holding home levothyroxine 100 mcg daily in setting of acute encephalopathy. Last TSH 0.367 though in setting of acute illness. Can recheck TSH outpatient.  Hx of CVA On Plavix 75 mg. Holding due to NPO.   Anxiety Holding home home buspar and paxil in setting of acute encephalopathy.  GERD Holding home famotidine 20 mg daily in setting of acute encephalopathy.   Leg cramps/tremor Holding home ropinirole 1 mg in setting of acute encephalopathy.   Dispo: Admit patient to Inpatient with expected length of stay greater than 2 midnights.  Signed: Alexandria Lodge, MD Internal Medicine Resident, PGY-1 [facility] Pager: [phone] 11:15 PM, 02/11/2021

## 2021-02-11 NOTE — Progress Notes (Addendum)
RT attempted to obtain ABG. Patient is refusing ABG at this time. Patient continues to withdraw her arm and refuses ABG at this time.

## 2021-02-11 NOTE — ED Notes (Signed)
Patient transported to MRI 

## 2021-02-11 NOTE — Progress Notes (Signed)
Pharmacy Antibiotic Note  Shateria Niebauer is a 75 y.o. female admitted on 02/11/2021 presenting with wound infection and UTI, concern for possible DFI.  Pharmacy has been consulted for vancomycin dosing.  Hx CKD, SCr 3.37 sig elevated from baseline  Plan: Vancomycin 1250 mg IV x 1, then variable dosing due to unstable renal function Monitor renal function, Cx and clinical progression to narrow Vancomycin levels as needed     Temp (24hrs), Avg:99 F (37.2 C), Min:98.8 F (37.1 C), Max:99.1 F (37.3 C)  Recent Labs  Lab 02/11/21 1640  WBC 10.8*  CREATININE 3.37*    CrCl cannot be calculated (Unknown ideal weight.).    No Known Allergies  Bertis Ruddy, PharmD Clinical Pharmacist ED Pharmacist Phone # [phone] 02/11/2021 8:44 PM

## 2021-02-11 NOTE — ED Notes (Signed)
Patient transported to X-ray 

## 2021-02-12 ENCOUNTER — Inpatient Hospital Stay (HOSPITAL_COMMUNITY): Payer: Medicare (Managed Care)

## 2021-02-12 ENCOUNTER — Other Ambulatory Visit: Payer: Self-pay

## 2021-02-12 ENCOUNTER — Encounter (HOSPITAL_COMMUNITY): Payer: Self-pay | Admitting: Internal Medicine

## 2021-02-12 DIAGNOSIS — L0390 Cellulitis, unspecified: Secondary | ICD-10-CM

## 2021-02-12 DIAGNOSIS — N184 Chronic kidney disease, stage 4 (severe): Secondary | ICD-10-CM

## 2021-02-12 DIAGNOSIS — G9340 Encephalopathy, unspecified: Secondary | ICD-10-CM

## 2021-02-12 DIAGNOSIS — G9341 Metabolic encephalopathy: Secondary | ICD-10-CM | POA: Diagnosis present

## 2021-02-12 DIAGNOSIS — R609 Edema, unspecified: Secondary | ICD-10-CM

## 2021-02-12 DIAGNOSIS — E118 Type 2 diabetes mellitus with unspecified complications: Secondary | ICD-10-CM | POA: Diagnosis not present

## 2021-02-12 DIAGNOSIS — N390 Urinary tract infection, site not specified: Secondary | ICD-10-CM

## 2021-02-12 DIAGNOSIS — N179 Acute kidney failure, unspecified: Secondary | ICD-10-CM | POA: Diagnosis not present

## 2021-02-12 DIAGNOSIS — L03115 Cellulitis of right lower limb: Secondary | ICD-10-CM

## 2021-02-12 LAB — CBC WITH DIFFERENTIAL/PLATELET
Abs Immature Granulocytes: 0.05 10*3/uL (ref 0.00–0.07)
Basophils Absolute: 0.1 10*3/uL (ref 0.0–0.1)
Basophils Relative: 1 %
Eosinophils Absolute: 0.2 10*3/uL (ref 0.0–0.5)
Eosinophils Relative: 1 %
HCT: 25.8 % — ABNORMAL LOW (ref 36.0–46.0)
Hemoglobin: 8 g/dL — ABNORMAL LOW (ref 12.0–15.0)
Immature Granulocytes: 0 %
Lymphocytes Relative: 10 %
Lymphs Abs: 1.2 10*3/uL (ref 0.7–4.0)
MCH: 30.2 pg (ref 26.0–34.0)
MCHC: 31 g/dL (ref 30.0–36.0)
MCV: 97.4 fL (ref 80.0–100.0)
Monocytes Absolute: 1 10*3/uL (ref 0.1–1.0)
Monocytes Relative: 8 %
Neutro Abs: 9.5 10*3/uL — ABNORMAL HIGH (ref 1.7–7.7)
Neutrophils Relative %: 80 %
Platelets: 235 10*3/uL (ref 150–400)
RBC: 2.65 MIL/uL — ABNORMAL LOW (ref 3.87–5.11)
RDW: 14.6 % (ref 11.5–15.5)
WBC: 11.9 10*3/uL — ABNORMAL HIGH (ref 4.0–10.5)
nRBC: 0 % (ref 0.0–0.2)

## 2021-02-12 LAB — RENAL FUNCTION PANEL
Albumin: 2.7 g/dL — ABNORMAL LOW (ref 3.5–5.0)
Anion gap: 9 (ref 5–15)
BUN: 51 mg/dL — ABNORMAL HIGH (ref 8–23)
CO2: 14 mmol/L — ABNORMAL LOW (ref 22–32)
Calcium: 8.1 mg/dL — ABNORMAL LOW (ref 8.9–10.3)
Chloride: 115 mmol/L — ABNORMAL HIGH (ref 98–111)
Creatinine, Ser: 3.23 mg/dL — ABNORMAL HIGH (ref 0.44–1.00)
GFR, Estimated: 14 mL/min — ABNORMAL LOW (ref >60)
Glucose, Bld: 121 mg/dL — ABNORMAL HIGH (ref 70–99)
Phosphorus: 7.9 mg/dL — ABNORMAL HIGH (ref 2.5–4.6)
Potassium: 5.4 mmol/L — ABNORMAL HIGH (ref 3.5–5.1)
Sodium: 138 mmol/L (ref 135–145)

## 2021-02-12 LAB — RAPID URINE DRUG SCREEN, HOSP PERFORMED
Amphetamines: NOT DETECTED
Barbiturates: NOT DETECTED
Benzodiazepines: NOT DETECTED
Cocaine: NOT DETECTED
Opiates: POSITIVE — AB
Tetrahydrocannabinol: NOT DETECTED

## 2021-02-12 LAB — IRON AND TIBC
Iron: 53 ug/dL (ref 28–170)
Saturation Ratios: 29 % (ref 10.4–31.8)
TIBC: 183 ug/dL — ABNORMAL LOW (ref 250–450)
UIBC: 130 ug/dL

## 2021-02-12 LAB — PROTEIN / CREATININE RATIO, URINE
Creatinine, Urine: 38.28 mg/dL
Protein Creatinine Ratio: 7.34 mg/mg{Cre} — ABNORMAL HIGH (ref 0.00–0.15)
Total Protein, Urine: 281 mg/dL

## 2021-02-12 LAB — BLOOD GAS, VENOUS
Acid-base deficit: 14.3 mmol/L — ABNORMAL HIGH (ref 0.0–2.0)
Bicarbonate: 12.6 mmol/L — ABNORMAL LOW (ref 20.0–28.0)
FIO2: 100
O2 Saturation: 57.7 %
Patient temperature: 37
pCO2, Ven: 36.8 mmHg — ABNORMAL LOW (ref 44.0–60.0)
pH, Ven: 7.162 — CL (ref 7.250–7.430)
pO2, Ven: 32.9 mmHg (ref 32.0–45.0)

## 2021-02-12 LAB — URINE CULTURE

## 2021-02-12 LAB — C-REACTIVE PROTEIN: CRP: 0.8 mg/dL (ref <1.0)

## 2021-02-12 LAB — SODIUM, URINE, RANDOM: Sodium, Ur: 56 mmol/L

## 2021-02-12 LAB — CREATININE, URINE, RANDOM: Creatinine, Urine: 69.32 mg/dL

## 2021-02-12 LAB — SARS CORONAVIRUS 2 (TAT 6-24 HRS): SARS Coronavirus 2: NEGATIVE

## 2021-02-12 LAB — LACTIC ACID, PLASMA: Lactic Acid, Venous: 0.7 mmol/L (ref 0.5–1.9)

## 2021-02-12 LAB — GLUCOSE, CAPILLARY: Glucose-Capillary: 97 mg/dL (ref 70–99)

## 2021-02-12 MED ORDER — VANCOMYCIN HCL 750 MG/150ML IV SOLN: 750.0000 mg | INTRAVENOUS | Status: DC

## 2021-02-12 MED ORDER — STERILE WATER FOR INJECTION IV SOLN
INTRAVENOUS | Status: AC
  Filled 2021-02-12: qty 1000

## 2021-02-12 MED ORDER — SODIUM ZIRCONIUM CYCLOSILICATE 10 G PO PACK
10.0000 g | PACK | Freq: Once | ORAL | Status: DC
  Filled 2021-02-12: qty 1

## 2021-02-12 MED ORDER — STERILE WATER FOR INJECTION IV SOLN
INTRAVENOUS | Status: DC
  Filled 2021-02-12: qty 1000

## 2021-02-12 MED ORDER — FAMOTIDINE 20 MG PO TABS
20.0000 mg | ORAL_TABLET | Freq: Once | ORAL | Status: AC
  Administered 2021-02-12: 20 mg via ORAL
  Filled 2021-02-12: qty 1

## 2021-02-12 MED ORDER — STERILE WATER FOR INJECTION IV SOLN
INTRAVENOUS | Status: DC
  Filled 2021-02-12 (×3): qty 1000

## 2021-02-12 NOTE — Consult Note (Addendum)
[facility]                                                                                        Patient Identification: Patient Name: Molly Arroyo MRN: [id number] [place] Date: 02/11/2021  2:49 PM Today's Date: 02/12/2021 Reason for consult: Right foot osteomyelitis Requesting provider: Aldine Contes   Principal Problem:   Acute encephalopathy Active Problems:   Acute renal failure superimposed on stage 4 chronic kidney disease ([place])   Type 2 diabetes mellitus with renal complication ([place])   Microscopic hematuria   Urinary tract infection   Diabetic ulcer of right midfoot associated with type 2 diabetes mellitus, with fat layer exposed ([place])   Diabetic ulcer of right great toe (HCC)   Cellulitis of right lower extremity   Acute metabolic encephalopathy   Antibiotics: Vancomycin 5/22-current                    Ceftriaxone 5/22 -current                     Metronidazole 5/22 -current  Lines/Tubes: PIVs   Assessment 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 Please call with questions or concerns.  Thank you for the consult  Rosiland Oz, MD Infectious Disease Physician [facility] [address] [place], [place] [address] Phone: [phone]  Fax: [phone]  __________________________________________________________________________________________________________ HPI and Hospital Course: 75 year old female with past medical history of type II DM, hypertension, CKD, CVA, mild cognitive impairment , spinal  stenosis , chronic pain on chronic opioids , HFpEF, hypothyroidism , GERD , right knee replacement and recent hospitalization 3/3-3/08/2021 in the setting of septic shock secondary to right lower extremity cellulitis complicated by respiratory failure and AKI who presented to the ED on 5/22 with altered mental status.  Patient is awake, able to tell me the place, name, year and month but is not able to tell why she is in the hospital.  History mostly obtained from chart review.  Patient was in her normal state of health until 7-8 pm on 5/21 when she was found to be slumped to her right side and sleeping on the couch.  Her responses were slow per daughter and EMS was called.  Patient also told she fell to providers at ED although denied loss of consciousness or hitting head.   At ED, she was found to be afebrile, leukocytosis of 11.9.  Urine culture with multiple species.  Blood culture 5/22 pending.  X-ray with streaky bibasilar opacities may represent atelectasis or scarring.  CT head and  MRI brain unremarkable.  X-ray right foot concerning for right great toe ulceration and acute osteomyelitis.  MRI is pending   ROS: Limited ROS due to patient being a poor historian but denies any pain/dsicomfort   Past  Medical History:  Diagnosis Date  . Diabetes mellitus without complication ([place])   . Hypertension   . Thyroid disease      Scheduled Meds: . heparin  5,000 Units Subcutaneous Q8H   Continuous Infusions: . cefTRIAXone (ROCEPHIN)  IV    . metronidazole 500 mg (02/12/21 1345)  .  sodium bicarbonate (isotonic) infusion in sterile water 75 mL/hr at 02/12/21 1214  . [START ON 02/13/2021] vancomycin     PRN Meds:.acetaminophen **OR** acetaminophen  No Known Allergies  Social History   Socioeconomic History  . Marital status: Unknown    Spouse name: Not on file  . Number of children: Not on file  . Years of education: Not on file  . Highest education level: Not on file  Occupational  History  . Not on file  Tobacco Use  . Smoking status: Not on file  . Smokeless tobacco: Not on file  Substance and Sexual Activity  . Alcohol use: Never  . Drug use: Never  . Sexual activity: Not on file  Other Topics Concern  . Not on file  Social History Narrative  . Not on file   Social Determinants of Health   Financial Resource Strain: Not on file  Food Insecurity: Not on file  Transportation Needs: Not on file  Physical Activity: Not on file  Stress: Not on file  Social Connections: Not on file  Intimate Partner Violence: Not on file   Family h/o - unavailable from patient  Vitals BP (!) 125/48 (BP Location: Left Arm)   Pulse 65   Temp 98.1 F (36.7 C) (Oral)   Resp 16   Ht 5' 2"  (1.575 m)   Wt 60.1 kg   SpO2 97%   BMI 24.23 kg/m    Physical Exam Constitutional:   Lying in bed comfortably without any acute distress    Comments:   Cardiovascular:     Rate and Rhythm: Normal rate and regular rhythm.     Heart sounds: No murmur heard.   Pulmonary:     Effort: Pulmonary effort is normal.     Comments:   Abdominal:     Palpations: Abdomen is soft.     Tenderness: Non tender and non distended   Musculoskeletal:        General: No swelling or tenderness. RT knee replacement - no issues      Skin:    Comments: no obvious rashes or lesions  Neurological:     General: No focal deficit present.   Psychiatric:        Mood and Affect: Mood normal.    Pertinent Microbiology Results for orders placed or performed during the hospital encounter of 02/11/21  Urine culture     Status: Abnormal   Collection Time: 02/11/21  3:40 PM   Specimen: Urine, Random  Result Value Ref Range Status   Specimen Description URINE, RANDOM  Final   Special Requests   Final    NONE Performed at [facility], [address], [place], [place] [address]    Culture MULTIPLE SPECIES PRESENT, SUGGEST RECOLLECTION (A)  Final   Report Status 02/12/2021 FINAL  Final   SARS CORONAVIRUS 2 (TAT 6-24 HRS) Nasopharyngeal Nasopharyngeal Swab     Status: None   Collection Time: 02/11/21  5:52 PM   Specimen: Nasopharyngeal Swab  Result Value Ref Range Status   SARS Coronavirus 2 NEGATIVE NEGATIVE Final    Comment: (NOTE) SARS-CoV-2 target nucleic acids are NOT DETECTED.  The SARS-CoV-2  RNA is generally detectable in upper and lower respiratory specimens during the acute phase of infection. Negative results do not preclude SARS-CoV-2 infection, do not rule out co-infections with other pathogens, and should not be used as the sole basis for treatment or other patient management decisions. Negative results must be combined with clinical observations, patient history, and epidemiological information. The expected result is Negative.  Fact Sheet for Patients: SugarRoll.be  Fact Sheet for Healthcare Providers: https://www.woods-mathews.com/  This test is not yet approved or cleared by the [place] FDA and  has been authorized for detection and/or diagnosis of SARS-CoV-2 by FDA under an Emergency Use Authorization (EUA). This EUA will remain  in effect (meaning this test can be used) for the duration of the COVID-19 declaration under Se ction 564(b)(1) of the Act, 21 U.S.C. section 360bbb-3(b)(1), unless the authorization is terminated or revoked sooner.  Performed at [facility], [place] [address] [place], [place] [address]   Culture, blood (routine x 2)     Status: None (Preliminary result)   Collection Time: 02/11/21  8:11 PM   Specimen: BLOOD  Result Value Ref Range Status   Specimen Description BLOOD LEFT ARM  Final   Special Requests   Final    BOTTLES DRAWN AEROBIC ONLY Blood Culture results may not be optimal due to an inadequate volume of blood received in culture bottles   Culture   Final    NO GROWTH < 12 HOURS Performed at [facility], [place] [address] [place], [place] [address]     Report Status PENDING  Incomplete  Culture, blood (routine x 2)     Status: None (Preliminary result)   Collection Time: 02/11/21  9:12 PM   Specimen: BLOOD  Result Value Ref Range Status   Specimen Description BLOOD RIGHT HAND  Final   Special Requests   Final    BOTTLES DRAWN AEROBIC AND ANAEROBIC Blood Culture results may not be optimal due to an inadequate volume of blood received in culture bottles   Culture   Final    NO GROWTH < 12 HOURS Performed at [facility], [place] [address], [place], [place] [address]    Report Status PENDING  Incomplete     Pertinent Lab seen by me: CBC Latest Ref Rng & Units 02/12/2021 02/11/2021 02/11/2021  WBC 4.0 - 10.5 K/uL 11.9(H) - -  Hemoglobin 12.0 - 15.0 g/dL 8.0(L) 8.2(L) 8.2(L)  Hematocrit 36.0 - 46.0 % 25.8(L) 24.0(L) 24.0(L)  Platelets 150 - 400 K/uL 235 - -   CMP Latest Ref Rng & Units 02/12/2021 02/11/2021 02/11/2021  Glucose 70 - 99 mg/dL 121(H) - -  BUN 8 - 23 mg/dL 51(H) - -  Creatinine 0.44 - 1.00 mg/dL 3.23(H) - -  Sodium 135 - 145 mmol/L 138 139 140  Potassium 3.5 - 5.1 mmol/L 5.4(H) 5.1 5.1  Chloride 98 - 111 mmol/L 115(H) - -  CO2 22 - 32 mmol/L 14(L) - -  Calcium 8.9 - 10.3 mg/dL 8.1(L) - -  Total Protein 6.5 - 8.1 g/dL - - -  Total Bilirubin 0.3 - 1.2 mg/dL - - -  Alkaline Phos 38 - 126 U/L - - -  AST 15 - 41 U/L - - -  ALT 0 - 44 U/L - - -     Pertinent Imagings/Other Imagings Plain films and CT images have been personally visualized and interpreted; radiology reports have been reviewed. Decision making incorporated into the Impression / Recommendations.  Xray Rt Foot 02/11/21  FINDINGS: The second, fourth and fifth right toes are flexed in position and subsequently limited in evaluation. The phalanges of the third right toe are likely surgically absent.  A small cortical defect is seen along the medial aspect of the base of the distal phalanx of the right great toe. An adjacent 7 mm x 3 mm right great toe  soft tissue ulceration is noted.  Benign-appearing periarticular lucent areas are seen involving the right great toe, with chronic deformities involving the base of the first right metatarsal and distal aspect of the second right metatarsal.  A small to moderate sized plantar calcaneal spur is noted with marked severity degenerative changes seen within the mid right foot.  IMPRESSION: 1. Right great toe ulceration with a small area of suspected acute osteomyelitis. MRI correlation is recommended. 2. Chronic, postoperative and degenerative changes throughout the right foot, as described above.  I have spent more than 70  minutes for this patient encounter including review of prior medical records with greater than 50% of time being face to face and coordination of their care.  Electronically signed by:   Rosiland Oz, MD Infectious Disease Physician [facility] for Infectious Disease Pager: [phone]

## 2021-02-12 NOTE — Hospital Course (Addendum)
HFU: - outpatient renal MRI  - microscopic hematuria ?UTI, f/u in 4-6 weeks - repeat TSH outpatient  - OP urology folllow up (MRI)  - Consider DM medication changes at discharge  - switch chronic pain meds to dilaudid    AKI on CKD stage IV Non-anion gap metabolic acidosis  On admission, Na 136, K 5.4, bicarb 13, BUN/Cr 55/3.37 (baseline creatinine 1.8-2). CK 376. I-stat VBG with pH 7.150, pCO2 38.4. NAGMA is likely secondary to renal insufficiency.  Lactic acid was normal. Renal ultrasound did show a multiseptated and lobulated 5.1x3.6x3.1cm left renal cystic lesion with increased renal parenchymal echogenicity suggestive of renal parenchymal disease.  - For now, continue sodium bicarb 150 mEq in sterile water at 100 cc/hr - Nephrology consulted, appreciate their recommendations  - Will continue to avoid nephrotoxic medications as able  - Recommend outpatient urology follow up for MRI to evaluate complex renal cyst   Acute Encephalopathy, Multifactorial, Resolved  Etiology of encephalopathy is likely multifactorial, due to uremia, NAGMA and poor opiate clearance in the setting of AKI on CKD and also due to acute on chronic infection. Neurological workup was negative and her myoclonus has nearly resolved. Her AMS has also resolved and she is now A&O x 4.  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 Orthopedic surgery was consulted who do believe patient would benefit from amputation of right great toe as outpatient. At patient's request, an outpatient appointment was scheduled with Dr.  Sharol Given 02/20/21. - Will transition from unasyn to Augmentin on discharge - Blood cultures show no growth x 3 days

## 2021-02-12 NOTE — Consult Note (Signed)
[facility] Renal Consultation Note  Requesting MD: Aldine Contes MD Indication for Consultation:  AKI   Chief complaint: confusion  HPI:  Molly Arroyo is a 75 y.o. female with a history of DM, HTN, CKD, and thyroid disease who presented to the hospital with altered mental status.  Creatinine found to be 3.23 today.  Note she was also found to have purulent drainage from diabetic foot ulcer.  Nephrology is consulted for assistance with management of AKI.  She previously experienced AKI during an episode of  Cellulitis.  She was admitted to [facility] 11/2020 and there peak Cr was 4.65 on 11/23/20 and her baseline before and after event appeared to be around 2.  Home meds include lisinopril, MS contin, oxycodone, and ropinirole.  She had 200 mL uop over 5/22.  Renal US obtained today with no hydro and increased echogenicity; possible septated cyst noted left kidney with question associated mural nodularity.  She was also noted this hospitalization to have myoclonus.  She tells me that she came to the hospital because her daughter thought she had a stroke.  Mental status seems to be improving.  She denies any difficulty with urination.  Per nursing purewick hasn't worked well - has leaked and she ends up with urine in the bed.   Creatinine, Ser  Date/Time Value Ref Range Status  02/12/2021 03:41 AM 3.23 (H) 0.44 - 1.00 mg/dL Final  02/11/2021 04:40 PM 3.37 (H) 0.44 - 1.00 mg/dL Final     PMHx:   Past Medical History:  Diagnosis Date  . Diabetes mellitus without complication ([place])   . Hypertension   . Thyroid disease    Family Hx: she asks me to come back later she is tired of talking and has to urinate   Social History:  reports that she does not drink alcohol and does not use drugs. No history on file for tobacco use.   Allergies: No Known Allergies  Medications: Prior to Admission medications   Medication Sig Start Date End Date Taking? Authorizing Provider   amLODipine (NORVASC) 10 MG tablet Take 10 mg by mouth daily. 12/11/20   [provider]  atenolol (TENORMIN) 50 MG tablet Take 50 mg by mouth daily. 01/20/21   [provider]  clopidogrel (PLAVIX) 75 MG tablet Take 75 mg by mouth daily. 01/18/21   [provider]  furosemide (LASIX) 20 MG tablet Take 20 mg by mouth daily. 12/03/20   [provider]  glipiZIDE (GLUCOTROL) 10 MG tablet Take 10 mg by mouth 2 (two) times daily. 01/18/21   [provider]  INVOKANA 100 MG TABS tablet Take 100 mg by mouth daily. 12/03/20   [provider]  JANUVIA 50 MG tablet Take 50 mg by mouth daily. 01/18/21   [provider]  levothyroxine (SYNTHROID) 100 MCG tablet Take 100 mcg by mouth daily. 02/09/21   [provider]  lisinopril (ZESTRIL) 10 MG tablet Take 10 mg by mouth daily. 12/05/20   [provider]  morphine (MS CONTIN) 30 MG 12 hr tablet Take 30 mg by mouth 2 (two) times daily. 02/02/21   [provider]  oxyCODONE (OXY IR/ROXICODONE) 5 MG immediate release tablet Take 5 mg by mouth 2 (two) times daily as needed for pain. 02/02/21   [provider]  rOPINIRole (REQUIP) 1 MG tablet Take 1 mg by mouth at bedtime as needed for other. Leg cramps 01/18/21   [provider]    I have reviewed the patient's  current and reported prior to admission medications.  Labs:  BMP Latest Ref Rng & Units 02/12/2021 02/11/2021 02/11/2021  Glucose 70 - 99 mg/dL 121(H) - -  BUN 8 - 23 mg/dL 51(H) - -  Creatinine 0.44 - 1.00 mg/dL 3.23(H) - -  Sodium 135 - 145 mmol/L 138 139 140  Potassium 3.5 - 5.1 mmol/L 5.4(H) 5.1 5.1  Chloride 98 - 111 mmol/L 115(H) - -  CO2 22 - 32 mmol/L 14(L) - -  Calcium 8.9 - 10.3 mg/dL 8.1(L) - -    Urinalysis    Component Value Date/Time   COLORURINE YELLOW 02/11/2021 1540   APPEARANCEUR TURBID (A) 02/11/2021 1540   LABSPEC 1.009 02/11/2021 1540   PHURINE 5.0 02/11/2021 1540   GLUCOSEU  >=500 (A) 02/11/2021 1540   HGBUR MODERATE (A) 02/11/2021 1540   BILIRUBINUR NEGATIVE 02/11/2021 [place] 02/11/2021 1540   PROTEINUR >=300 (A) 02/11/2021 1540   NITRITE NEGATIVE 02/11/2021 1540   LEUKOCYTESUR LARGE (A) 02/11/2021 1540     ROS:  Pertinent items noted in HPI and remainder of comprehensive ROS otherwise negative.    Physical Exam: Vitals:   02/12/21 0800 02/12/21 1052  BP: 137/64 (!) 125/48  Pulse: 68 65  Resp: 18 16  Temp:  98.1 F (36.7 C)  SpO2: 96% 97%     General: elderly female in bed - uncomfortable - waiting to use the restroom and staff arrived to room to assist HEENT: NCAT Eyes: EOMI sclera anicteric Neck: supple trachea midline Heart: S1S2 no rub Lungs: clear and unlabored; on room air Abdomen: soft/NT/ND Extremities: no edema lower extremities no clubbing or cyanosis upper extremities  Skin: no rash on extremities exposed; right foot covered with sock Neuro: myclonus noted; speech appears fluent and she is alert and oriented x3 provides basic hx and follows commands  Assessment/Plan:  # AKI   - Pre-renal and ischemic insults in setting of infection.  Also with recent AKI episode two months ago.  UA with RBCs in setting of UTI - Continue supportive measures including bicarb gtt - reordered at 75 ml/hr - check up/cr ratio   - ANA/ANCA/complement given the hematuria but this may be due to UTI   - would avoid morphine for pain given her renal failure   # CKD stage IV - Baseline Cr 2   # Metabolic acidosis   - Agree with bicarb gtt   # Hyperkalemia - on bicarb gtt; npo so cannot take lokelma  # Encephalopathy  - metabolic in part with acidosis and AKI; note also on morphine and oxycodone.  Also with UTI.  Concurrent myoclonus - would avoid morphine for pain given her renal failure  - s/p neuro eval - for EEG - appears to be improving  # Diabetic foot ulcer - right foot  - abx per primary team   # Anemia - normocytic   - update iron panel   # HTN  - Controlled; avoid hypotension   # Multi-septated renal cyst  - would recommend outpatient follow-up with urology    Claudia Desanctis 02/12/2021,4:36 PM

## 2021-02-12 NOTE — Progress Notes (Signed)
Subjective:   This morning, Molly Arroyo is sleeping upon entering the room. She understands that she is in a hospital in [place] but is not sure why she's here. She does say "yes" to having abdominal pain to palpation but otherwise does not indicate she's had fevers, chills, dysuria, and denies feeling confused. No acute complaints.   History and PE are limited due to AMS.   Objective:  Vital signs in last 24 hours: Vitals:   02/12/21 0245 02/12/21 0300 02/12/21 0327 02/12/21 0739  BP: (!) 141/91 (!) 103/45    Pulse: 69 62    Resp: 14 14    Temp:  99 F (37.2 C) 99 F (37.2 C) 98.1 F (36.7 C)  TempSrc:  Oral Oral Oral  SpO2: 92% 92%    Weight:   60.1 kg    General: Patient appears chronically ill and uncomfortable. Eyes: Sclera non-icteric. No conjunctival injection. EOMI.  HENT: MMM. No nasal discharge.  Respiratory: Unable to reliably appreciate patient's lung sounds due to inability to participate on exam. Does not appear to be in any acute respiratory distress.  Cardiovascular: Regular rate and rhythm. No murmurs, rubs, or gallops. No lower extremity edema. Extremities are warm.  Neurological: Patient is somewhat somnolent. She is oriented x 3 although not fully to situation. She has myoclonus with increased tone in all extremities.  Abdominal: Lower abdomen has intermittent tenderness with voluntary guarding but no rebound. No distention. Bowel sounds soft but intact throughout.  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  Skin: Right great toe is edematous with erythema surrounding a punctate, yellow/green pore without active drainage.  Psych: Normal affect. Normal tone of voice.   Assessment/Plan:  Principal Problem:   Acute encephalopathy Active Problems:   Acute renal failure superimposed on stage 4 chronic kidney disease (HCC)   Type 2 diabetes mellitus with renal complication (HCC)   Microscopic hematuria   Urinary tract infection   Diabetic ulcer of right midfoot  associated with type 2 diabetes mellitus, with fat layer exposed ([place])   Diabetic ulcer of right great toe (HCC)   Cellulitis of right lower extremity   Acute metabolic encephalopathy  Molly Arroyo is a 75 y.o. woman with a past medical history significant for type 2 diabetes, hypertension, CKD stage IV, chronic pain on chronic opioids, CVA, mild cognitive impairment, spinal stenosis, HFpEF, hypothyroidism, GERD, R knee replacement, and recent hospitalization 11/23/20-12/02/20 for septic shock secondary to RLE cellulitis complicated by hypoxemic respiratory failure and AKI who presents from home with acute encephalopathy, admitted for further evaluation and management of suspected multifactorial acute metabolic and toxic encephalopathy as well as AKI on CKD.  Acute Encephalopathy, likely 2/2 Infection and Opioid Overdose (in setting of AKI) Acute R-sided Weakness  Unwitnessed Fall  Patient's mental status seems to have improved slightly since yesterday, now A&O x 3 and more alert, although still has diffuse myoclonus and only answers questions and follows commands intermittently. Urine drug screen was only positive for opiates, as expected. EEG showed moderate diffuse encephalopathy with active RUE twitching that did not show concomitant EEG change - no epileptiform discharges were seen throughout the recording. MRI was unremarkable. Her AMS remains most likely due to a buildup of morphine and oxycodone in her system due to her AKI (on CKD IV) as well as possible UTI / RLE cellulitis +/- contribution from uremia. - Continue NPO  - Continue to hold centrally-acting medications  - Delirium precautions, fall precautions - Continue treatment as below:  Infected Diabetic Foot Ulcer  XR of the right foot shows right great toe ulceration with a small area of suspected acute osteomyelitis with chronic, post-operative and degenerative changes throughout. She had this ulcer debrided during her recent  admission at [facility] 11/2020 and her wound cultures grew pan-sensitive MSSA; however, she has had purulent drainage from this lesion. ABI on the right was unremarkable (although left ABI showed non-compressible arteries). DVT ultrasound was unremarkable aside from pulsatile flow throughout, suggesting fluid overload.  - Check right foot MRI  - ID consulted, appreciate their recommendations  - For now, continue ceftriaxone, vancomycin, and flagyl  - Follow blood cultures  - check ESR/CRP  Likely Uncomplicated Cystitis  U/A with large leuk (>50), many bacteria, negative nitrite, moderate hemoglobin (11-20), glucose >500, protein >300. Patient was recently restarted on SGLT2i Invokana. She does intermittently seem to have lower abdominal TTP and guarding on exam, although this may be distractible. No obvious CVA tenderness. Urine culture unfortunately grew multiple species, although she reportedly had candidal UTI on previous admission.  - Repeat urine culture  - Continue Ceftriaxone   AKI on CKD stage IV Mild hyperkalemia Non-anion gap metabolic acidosis  On admission, Na 136, K 5.4, bicarb 13, BUN/Cr 55/3.37 (baseline creatinine 1.8-2). CK 376. I-stat VBG with pH 7.150, pCO2 38.4. NAGMA is likely secondary to renal insufficiency.  Lactic acid was normal. Renal ultrasound did show a multiseptated and lobulated 5.1x3.6x3.1cm left renal cystic lesion with increased renal parenchymal echogenicity suggestive of renal parenchymal disease.  - For now, continue sodium bicarb 150 mEq in sterile water at 100 cc/hr - Nephrology consulted, appreciate their recommendations  - Will continue to avoid nephrotoxic medications as able  - Recommend outpatient MRI   HFpEF (G1DD)  Last formal echo 06/2017 showed LVEF 60-65% with grade 1 diastolic dysfunction. Patient previously on Lasix however was held during recent hospitalization and has since not been resumed. She does not have obvious LE edema today although DVT  ultrasound did show underlying subcutaneous edema consistent with volume overload.  - Will decrease NaHCO3 to 75cc's an hour and monitor volume status closely  - Strict I/O, daily weights  Type 2 diabetes mellitus Serum glucose 147 on admission. Last A1c 6.9% on 11/28/20. On invokana, januvia, and glipizide - Holding SSI given NPO  Normocytic anemia Hemoglobin 9.5 with MCV 97.5 on admission, although down-trending to 8. Unclear baseline, 7.9 on day of discharge from last hospitalization (12/02/20).  Last iron studies during 11/2020 hospitalization showed ferritin 487, iron 15, Tsat 14. Suspect secondary to anemia of chronic disease. - Monitor daily CBC   HTN  Blood pressures have been intermittently elevated with widened pulse pressure, although otherwise stable. On amlodipine 10 mg, lisinopril 40 mg, and atenolol 50 mg daily at home.  - Will continue to hold home medications while NPO for now  - Continue monitoring   Chronic pain on chronic opioids Spinal stenosis On Morphine 30 mg twice daily and oxycodone 5 mg twice daily as needed. Per most PCP note in Care Everywhere from 12/29/20, patient's PCP expressed concern patient may be taking more frequently than prescribed. - Holding home medications in setting of acute encephalopathy - Would benefit in switch to dilaudid if chronic opioids are needed given advanced kidney disease  Hypothyroidism  Last TSH 0.367 though in setting of acute illness. Can recheck TSH outpatient. - Holding home levothyroxine 100 mcg daily in setting of acute encephalopathy  Hx of CVA  - On Plavix 75 mg. Holding due  to NPO.   Anxiety, GERD, leg cramps/tremor - will restart home medications as AMS improves / able to tolerate PO  Code Status: Full code  Diet: NPO  DVT PPx: Heparin  Fluids: NaHCO3 sterile water 75cc/hr  Prior to Admission Living Arrangement: Home, living with friend  Anticipated Discharge Location: SNF, pending PT/OT Barriers to Discharge:  Continued medical workup   Jeralyn Bennett, MD 02/12/2021, 7:41 AM Pager: [phone] After 5pm on weekdays and 1pm on weekends: On Call pager [phone]

## 2021-02-12 NOTE — Progress Notes (Signed)
VASCULAR LAB    Right lower extremity venous duplex has been performed.  See CV proc for preliminary results.   Ramani Riva, RVT 02/12/2021, 10:37 AM

## 2021-02-12 NOTE — Procedures (Signed)
Patient Name: Molly Arroyo  MRN: [id number]  Epilepsy Attending: Lora Havens  Referring Physician/Provider: Dr Alexandria Lodge Date: 02/12/2021 Duration: 25.39 mins  Patient history: 75 year old female with altered mental status and diffuse myoclonus.  EEG to evaluate for seizures.  Level of alertness: Awake  AEDs during EEG study: None  Technical aspects: This EEG study was done with scalp electrodes positioned according to the 10-20 International system of electrode placement. Electrical activity was acquired at a sampling rate of '500Hz'$  and reviewed with a high frequency filter of '70Hz'$  and a low frequency filter of '1Hz'$ . EEG data were recorded continuously and digitally stored.   Description: No clear posterior dominant rhythm was seen.  EEG showed continuous generalized polymorphic 3 to 5 Hz theta-delta slowing.  At times patient was noted to have intermittent nonrhythmic twitching of right upper extremity. Concomitant EEG before, during and after the event did not show any EEG to suggest seizure.  Hyperventilation and photic stimulation were not performed.     ABNORMALITY - Continuous slow, generalized  IMPRESSION: This study is suggestive of moderate diffuse encephalopathy, nonspecific etiology. No seizures or epileptiform discharges were seen throughout the recording.  Patient was noted to have intermittent right upper extremity twitching without concomitant EEG change.  This was NOT an epileptic event.  Kyce Ging Barbra Sarks

## 2021-02-12 NOTE — Progress Notes (Signed)
VASCULAR LAB    ABIs have been performed.  See CV proc for preliminary results.   Lynel Forester, RVT 02/12/2021, 10:37 AM

## 2021-02-12 NOTE — Plan of Care (Signed)

## 2021-02-12 NOTE — Progress Notes (Signed)
Pharmacy Antibiotic Note  Molly Arroyo is a 75 y.o. female admitted on 02/11/2021 presenting with wound infection and UTI, concern for possible DFI.  Pharmacy has been consulted for vancomycin dosing.  Hx CKD, SCr 3.23 this AM (little improvement from admission)  Plan: Start Vancomycin 750 mg iv Q 48 hours 5/24 Monitor renal function, Cx and clinical progression to narrow Vancomycin levels as needed  Height: '5\' 2"'$  (157.5 cm) Weight: 60.1 kg (132 lb 7.9 oz) IBW/kg (Calculated) : 50.1  Temp (24hrs), Avg:98.8 F (37.1 C), Min:98.1 F (36.7 C), Max:99.1 F (37.3 C)  Recent Labs  Lab 02/11/21 1640 02/11/21 2300 02/12/21 0341  WBC 10.8*  --  11.9*  CREATININE 3.37*  --  3.23*  LATICACIDVEN  --  0.7 0.7    Estimated Creatinine Clearance: 11.7 mL/min (A) (by C-G formula based on SCr of 3.23 mg/dL (H)).    No Known Allergies  Thank you Anette Guarneri, PharmD 02/12/2021 9:02 AM

## 2021-02-12 NOTE — Progress Notes (Signed)
EEG complete - results pending 

## 2021-02-12 NOTE — Progress Notes (Signed)
Patient's daughter, Shirlean Mylar updated of patient status.

## 2021-02-12 NOTE — Progress Notes (Signed)
Bedside swallow done patient passed no coughing, no wheezes and no difficulty swallow. MD notified. Will continue to monitor.

## 2021-02-13 LAB — CBC WITH DIFFERENTIAL/PLATELET
Abs Immature Granulocytes: 0.03 10*3/uL (ref 0.00–0.07)
Basophils Absolute: 0.1 10*3/uL (ref 0.0–0.1)
Basophils Relative: 1 %
Eosinophils Absolute: 0.1 10*3/uL (ref 0.0–0.5)
Eosinophils Relative: 1 %
HCT: 24.9 % — ABNORMAL LOW (ref 36.0–46.0)
Hemoglobin: 8.2 g/dL — ABNORMAL LOW (ref 12.0–15.0)
Immature Granulocytes: 0 %
Lymphocytes Relative: 12 %
Lymphs Abs: 1.4 10*3/uL (ref 0.7–4.0)
MCH: 30.4 pg (ref 26.0–34.0)
MCHC: 32.9 g/dL (ref 30.0–36.0)
MCV: 92.2 fL (ref 80.0–100.0)
Monocytes Absolute: 0.7 10*3/uL (ref 0.1–1.0)
Monocytes Relative: 6 %
Neutro Abs: 9 10*3/uL — ABNORMAL HIGH (ref 1.7–7.7)
Neutrophils Relative %: 80 %
Platelets: 232 10*3/uL (ref 150–400)
RBC: 2.7 MIL/uL — ABNORMAL LOW (ref 3.87–5.11)
RDW: 14.3 % (ref 11.5–15.5)
WBC: 11.3 10*3/uL — ABNORMAL HIGH (ref 4.0–10.5)
nRBC: 0 % (ref 0.0–0.2)

## 2021-02-13 LAB — T4, FREE: Free T4: 0.64 ng/dL (ref 0.61–1.12)

## 2021-02-13 LAB — RENAL FUNCTION PANEL
Albumin: 2.7 g/dL — ABNORMAL LOW (ref 3.5–5.0)
Anion gap: 13 (ref 5–15)
BUN: 43 mg/dL — ABNORMAL HIGH (ref 8–23)
CO2: 16 mmol/L — ABNORMAL LOW (ref 22–32)
Calcium: 8 mg/dL — ABNORMAL LOW (ref 8.9–10.3)
Chloride: 113 mmol/L — ABNORMAL HIGH (ref 98–111)
Creatinine, Ser: 2.7 mg/dL — ABNORMAL HIGH (ref 0.44–1.00)
GFR, Estimated: 18 mL/min — ABNORMAL LOW (ref >60)
Glucose, Bld: 79 mg/dL (ref 70–99)
Phosphorus: 6.4 mg/dL — ABNORMAL HIGH (ref 2.5–4.6)
Potassium: 4.2 mmol/L (ref 3.5–5.1)
Sodium: 142 mmol/L (ref 135–145)

## 2021-02-13 LAB — C3 COMPLEMENT: C3 Complement: 115 mg/dL (ref 82–167)

## 2021-02-13 LAB — GLUCOSE, CAPILLARY
Glucose-Capillary: 94 mg/dL (ref 70–99)
Glucose-Capillary: 78 mg/dL (ref 70–99)
Glucose-Capillary: 86 mg/dL (ref 70–99)
Glucose-Capillary: 94 mg/dL (ref 70–99)
Glucose-Capillary: 121 mg/dL — ABNORMAL HIGH (ref 70–99)
Glucose-Capillary: 83 mg/dL (ref 70–99)

## 2021-02-13 LAB — C-REACTIVE PROTEIN: CRP: 1.9 mg/dL — ABNORMAL HIGH (ref <1.0)

## 2021-02-13 LAB — C4 COMPLEMENT: Complement C4, Body Fluid: 24 mg/dL (ref 12–38)

## 2021-02-13 LAB — SEDIMENTATION RATE: Sed Rate: 46 mm/hr — ABNORMAL HIGH (ref 0–22)

## 2021-02-13 LAB — ANA: Anti Nuclear Antibody (ANA): NEGATIVE

## 2021-02-13 LAB — TSH: TSH: 0.58 u[IU]/mL (ref 0.350–4.500)

## 2021-02-13 MED ORDER — VANCOMYCIN HCL 500 MG/100ML IV SOLN
500.0000 mg | INTRAVENOUS | Status: DC
  Administered 2021-02-13: 500 mg via INTRAVENOUS
  Filled 2021-02-13: qty 100

## 2021-02-13 MED ORDER — SODIUM CHLORIDE 0.9 % IV SOLN
3.0000 g | Freq: Two times a day (BID) | INTRAVENOUS | Status: DC
  Administered 2021-02-13 – 2021-02-14 (×3): 3 g via INTRAVENOUS
  Filled 2021-02-13 (×2): qty 8
  Filled 2021-02-13: qty 3
  Filled 2021-02-13 (×2): qty 8
  Filled 2021-02-13: qty 3

## 2021-02-13 MED ORDER — LABETALOL HCL 5 MG/ML IV SOLN: 5.0000 mg | INTRAVENOUS | Status: DC | PRN

## 2021-02-13 MED ORDER — LEVOTHYROXINE SODIUM 100 MCG PO TABS: 100.0000 ug | ORAL_TABLET | Freq: Every day | ORAL | Status: DC

## 2021-02-13 MED ORDER — ONDANSETRON HCL 4 MG/2ML IJ SOLN
4.0000 mg | Freq: Once | INTRAMUSCULAR | Status: AC
  Administered 2021-02-13: 4 mg via INTRAVENOUS
  Filled 2021-02-13: qty 2

## 2021-02-13 MED ORDER — INSULIN ASPART 100 UNIT/ML IJ SOLN: 0.0000 [IU] | Freq: Three times a day (TID) | INTRAMUSCULAR | Status: DC

## 2021-02-13 MED ORDER — CLOPIDOGREL BISULFATE 75 MG PO TABS
75.0000 mg | ORAL_TABLET | Freq: Every day | ORAL | Status: DC
  Administered 2021-02-13 – 2021-02-15 (×3): 75 mg via ORAL
  Filled 2021-02-13 (×3): qty 1

## 2021-02-13 MED ORDER — AMLODIPINE BESYLATE 10 MG PO TABS
10.0000 mg | ORAL_TABLET | Freq: Every day | ORAL | Status: DC
  Administered 2021-02-13 – 2021-02-15 (×3): 10 mg via ORAL
  Filled 2021-02-13 (×3): qty 1

## 2021-02-13 MED ORDER — ONDANSETRON HCL 4 MG PO TABS
4.0000 mg | ORAL_TABLET | Freq: Four times a day (QID) | ORAL | Status: DC | PRN
  Administered 2021-02-14: 4 mg via ORAL
  Filled 2021-02-13: qty 1

## 2021-02-13 NOTE — Progress Notes (Signed)
[facility] Progress Note  Name: Molly Arroyo MRN: [id number] DOB: December 29, 1945  Chief Complaint:  Altered mental status  Subjective:  had 1.5 liters UOP over 5/23 charted.  Nursing states that her daughter has reported pt with significant weight loss and considerable nausea - she had been getting zofran daily at home.   Review of systems:  Denies shortness of breath or chest pain Reports nausea; not vomiting  ------------------ Background on consult:  Molly Arroyo is a 75 y.o. female with a history of DM, HTN, CKD, and thyroid disease who presented to the hospital with altered mental status.  Creatinine found to be 3.23 today.  Note she was also found to have purulent drainage from diabetic foot ulcer.  Nephrology is consulted for assistance with management of AKI.  She previously experienced AKI during an episode of  Cellulitis.  She was admitted to [facility] 11/2020 and there peak Cr was 4.65 on 11/23/20 and her baseline before and after event appeared to be around 2.  Home meds include lisinopril, MS contin, oxycodone, and ropinirole.  She had 200 mL uop over 5/22.  Renal US obtained today with no hydro and increased echogenicity; possible septated cyst noted left kidney with question associated mural nodularity.  She was also noted this hospitalization to have myoclonus.  She tells me that she came to the hospital because her daughter thought she had a stroke.  Mental status seems to be improving.  She denies any difficulty with urination.  Per nursing purewick hasn't worked well - has leaked and she ends up with urine in the bed.    Intake/Output Summary (Last 24 hours) at 02/13/2021 0827 Last data filed at 02/13/2021 0600 Gross per 24 hour  Intake 1194.49 ml  Output 1450 ml  Net -255.51 ml    Vitals:  Vitals:   02/12/21 2032 02/13/21 0007 02/13/21 0406 02/13/21 0712  BP: (!) 139/52 (!) 136/57 (!) 136/56 (!) 150/66  Pulse: 75 69 65 72  Resp: '16 18 18 14  '$ Temp: 98.9  F (37.2 C) 98.5 F (36.9 C) 98.5 F (36.9 C) 98.4 F (36.9 C)  TempSrc: Oral Oral Oral Oral  SpO2: 92% 92% 95% 95%  Weight:   59.6 kg   Height:         Physical Exam:  General elderly female in bed in no acute distress HEENT normocephalic atraumatic extraocular movements intact sclera anicteric Neck supple trachea midline Lungs clear to auscultation bilaterally normal work of breathing at rest  Heart S1S2 no rub Abdomen soft nontender nondistended Extremities no edema appreciated Neuro - awake on arrival; alert and oriented x 3; guesses 1922 for year then states 2022.  Myoclonus noted.    Medications reviewed   Labs:  BMP Latest Ref Rng & Units 02/13/2021 02/12/2021 02/11/2021  Glucose 70 - 99 mg/dL 79 121(H) -  BUN 8 - 23 mg/dL 43(H) 51(H) -  Creatinine 0.44 - 1.00 mg/dL 2.70(H) 3.23(H) -  Sodium 135 - 145 mmol/L 142 138 139  Potassium 3.5 - 5.1 mmol/L 4.2 5.4(H) 5.1  Chloride 98 - 111 mmol/L 113(H) 115(H) -  CO2 22 - 32 mmol/L 16(L) 14(L) -  Calcium 8.9 - 10.3 mg/dL 8.0(L) 8.1(L) -     Assessment/Plan:   # AKI   - Pre-renal and ischemic insults in setting of infection.  Also with recent AKI episode two months ago.  UA with RBCs in setting of UTI.  Note up/cr ratio 7340 mg/g. Complement normal  - Improving with  supportive care.  Continue supportive measures including bicarb gtt  - ANA/ANCA in process given the hematuria but this may be due to UTI as she is improving with supportive care - would avoid morphine for pain given her renal failure   # CKD stage IV - Baseline Cr 2   # proteinuria  - nephrotic range  - check SPEP, UPEP, and free light chains in addition to above.  Note code for UPEP - random UPEP (random urine) - code [phone] listed as misc send out   # Metabolic acidosis   - Agree with bicarb gtt   # Hyperkalemia - resolved/temporized with bicarb  # Encephalopathy  - metabolic in part with acidosis and AKI; note also on morphine and oxycodone.   Also with UTI.  Concurrent myoclonus - would avoid morphine for pain given her renal failure  - s/p neuro eval - appears to be improving  # Diabetic foot ulcer - right foot  - abx per primary team   # Anemia - normocytic  - iron stores ok - Ruling out plasma cell dyscrasia as above  # HTN  - Controlled; avoid hypotension   # Multi-septated renal cyst  - would recommend outpatient follow-up with urology   # Hyperphosphatemia  - Trend with improving renal failure; would recommend renal diet when taking PO  Claudia Desanctis, MD 02/13/2021  8:42 AM

## 2021-02-13 NOTE — Progress Notes (Addendum)
Pharmacy Antibiotic Note  Molly Arroyo is a 75 y.o. female admitted on 02/11/2021 with R foot osteomyelitis.  Pharmacy has been consulted for Vancomycin dosing. Hx of CVA, DM, HTN, CKD, thyroid disease, L renal complex cyst. SCr improving, down to 2.'7mg'$ /dL now.  Plan: Increased previous dose from '750mg'$  IV q48h to '500mg'$  IV q36h for estimated AUC of 484  Height: '5\' 2"'$  (157.5 cm) Weight: 59.6 kg (131 lb 8 oz) IBW/kg (Calculated) : 50.1  Temp (24hrs), Avg:98.5 F (36.9 C), Min:98.1 F (36.7 C), Max:98.9 F (37.2 C)  Recent Labs  Lab 02/11/21 1640 02/11/21 2300 02/12/21 0341 02/13/21 0307  WBC 10.8*  --  11.9* 11.3*  CREATININE 3.37*  --  3.23* 2.70*  LATICACIDVEN  --  0.7 0.7  --     Estimated Creatinine Clearance: 14 mL/min (A) (by C-G formula based on SCr of 2.7 mg/dL (H)).    No Known Allergies  Antimicrobials this admission: Vanc 5/22 >>  Ceftriaxone 5/22 >> Flagyl 5/22 >>  Dose adjustments this admission: Vanc '750mg'$  IV q48h to '500mg'$  IV q36h  Microbiology results: 5/22 BCx: pending 5/23 UCx: pending   Thank you for allowing pharmacy to be a part of this patient's care.  Joetta Manners, PharmD, [facility] Clinical Pharmacist  Please check AMION for all [place] phone numbers After 10:00 PM, call [place] [phone]

## 2021-02-13 NOTE — Progress Notes (Signed)
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 Had debridements on 3/8 and 3/10, noted to have significant improvement after second debridement and discharged on PO doxycycline to complete a 10 days course. Xray RT foot negative for osteomyelitis   -Wound cx 3/8 at [facility]   Acute Encephalopathy - clinically improving, in the setting of infection, AKI , chronic opioid  ? UTI - on ceftriaxone -> Unasyn   Left renal complex cyst Diabetes mellitus CKD RT Knee Replacement   Recommendations Will change Vancomycin, ceftriaxone and Metronidazole to Unasyn for the time being while she is in the hospital Orthopedics recommending Rt great toe amputation as Outpatient either with her [facility] surgeon or with Dr Sharol Given. If this is the case then, would switch her to Augmentin ( renal dosing)  upon discharge and give her 6 weeks worth of Augmentin. If she undergoes amputation of her rt great toe achieving adequate source control, she will not need further antibiotics.  WOC care  Monitor CBC and BMP on antibiotics   I will sign off for now. But please call with questions/changes in current plan.   Rest of the management as per the primary team. Thank you for the consult. Please page with pertinent questions or concerns.  ______________________________________________________________________ Subjective Patient seen and examined at the bedside. Lying in bed, hard of hearing. Denies any burning urination, suprapubic tenderness or back pain   Vitals BP (!) 170/66 (BP Location: Right Arm)   Pulse 77   Temp 98.6 F (37 C) (Oral)   Resp 18   Ht '5\' 2"'$  (1.575 m)   Wt 59.6 kg   SpO2 96%   BMI 24.05 kg/m     Physical Exam Constitutional:   Not in acute distress, lying in bed    Comments: Normocephalic and atraumatic, hard of hearing  Cardiovascular:     Rate and Rhythm: Normal rate and regular rhythm.     Heart sounds: No murmur heard.   Pulmonary:     Effort: Pulmonary effort is normal.     Comments: No respiratory distress  Abdominal:     Palpations: Abdomen is soft.     Tenderness: Nontender and nondistended  Musculoskeletal:        General: No swelling or tenderness.   Skin:    Comments: No lesions or rashes Neurological:  General: No focal deficit present.   Psychiatric:        Mood and Affect: Mood normal.     Pertinent Microbiology Results for orders placed or performed during the hospital encounter of 02/11/21  Urine culture     Status: Abnormal   Collection Time: 02/11/21  3:40 PM   Specimen: Urine, Random  Result Value Ref Range Status   Specimen Description URINE, RANDOM  Final   Special Requests   Final    NONE Performed at [facility], [address], [place], [place] [address]    Culture MULTIPLE SPECIES PRESENT, SUGGEST  RECOLLECTION (A)  Final   Report Status 02/12/2021 FINAL  Final  SARS CORONAVIRUS 2 (TAT 6-24 HRS) Nasopharyngeal Nasopharyngeal Swab     Status: None   Collection Time: 02/11/21  5:52 PM   Specimen: Nasopharyngeal Swab  Result Value Ref Range Status   SARS Coronavirus 2 NEGATIVE NEGATIVE Final    Comment: (NOTE) SARS-CoV-2 target nucleic acids are NOT DETECTED.  The SARS-CoV-2 RNA is generally detectable in upper and lower respiratory specimens during the acute phase of infection. Negative results do not preclude SARS-CoV-2 infection, do not rule out co-infections with other pathogens, and should not be used as the sole basis for treatment or other patient management decisions. Negative results must be combined with clinical observations, patient history, and epidemiological information. The expected result is Negative.  Fact Sheet for Patients: SugarRoll.be  Fact Sheet for Healthcare Providers: https://www.woods-mathews.com/  This test is not yet approved or cleared by the [place] FDA and  has been authorized for detection and/or diagnosis of SARS-CoV-2 by FDA under an Emergency Use Authorization (EUA). This EUA will remain  in effect (meaning this test can be used) for the duration of the COVID-19 declaration under Se ction 564(b)(1) of the Act, 21 U.S.C. section 360bbb-3(b)(1), unless the authorization is terminated or revoked sooner.  Performed at [facility], [place] [address] [place], [place] [address]   Culture, blood (routine x 2)     Status: None (Preliminary result)   Collection Time: 02/11/21  8:11 PM   Specimen: BLOOD  Result Value Ref Range Status   Specimen Description BLOOD LEFT ARM  Final   Special Requests   Final    BOTTLES DRAWN AEROBIC ONLY Blood Culture results may not be optimal due to an inadequate volume of blood received in culture bottles   Culture   Final    NO GROWTH 2 DAYS Performed at [facility], [place] [address], [place], [place] [address]    Report Status PENDING  Incomplete  Culture, blood (routine x 2)     Status: None (Preliminary result)   Collection Time: 02/11/21  9:12 PM   Specimen: BLOOD  Result Value Ref Range Status   Specimen Description BLOOD RIGHT HAND  Final   Special Requests   Final    BOTTLES DRAWN AEROBIC AND ANAEROBIC Blood Culture results may not be optimal due to an inadequate volume of blood received in culture bottles   Culture   Final    NO GROWTH 2 DAYS Performed at [facility], [place] [address] [place], [place] [address]    Report Status PENDING  Incomplete    Pertinent Lab. CBC Latest Ref Rng & Units 02/13/2021 02/12/2021 02/11/2021  WBC 4.0 - 10.5 K/uL 11.3(H) 11.9(H) -  Hemoglobin 12.0 - 15.0 g/dL 8.2(L) 8.0(L) 8.2(L)  Hematocrit 36.0 - 46.0 % 24.9(L) 25.8(L) 24.0(L)  Platelets 150 - 400 K/uL 232 235 -   CMP Latest Ref Rng & Units 02/13/2021 02/12/2021 02/11/2021  Glucose 70 - 99 mg/dL 79 121(H) -  BUN 8 - 23 mg/dL 43(H) 51(H) -  Creatinine 0.44 - 1.00 mg/dL 2.70(H) 3.23(H) -  Sodium 135 - 145 mmol/L 142 138 139  Potassium 3.5 - 5.1 mmol/L 4.2 5.4(H) 5.1  Chloride 98 - 111 mmol/L 113(H) 115(H) -  CO2 22 - 32 mmol/L 16(L) 14(L) -  Calcium 8.9 - 10.3 mg/dL 8.0(L) 8.1(L) -  Total Protein 6.5 - 8.1 g/dL - - -  Total Bilirubin 0.3 - 1.2 mg/dL - - -  Alkaline Phos 38 - 126 U/L - - -  AST 15 - 41 U/L - - -  ALT 0 - 44 U/L - - -   Pertinent Imaging today Plain films and CT images have been personally visualized and interpreted; radiology reports have been reviewed. Decision making incorporated into the Impression / Recommendations.  I have spent more than 35 minutes for this patient encounter including review of prior medical records, coordination of care  with greater than 50% of time being face to face/counseling and discussing diagnostics/treatment plan with the patient/family.  Electronically signed by:   Rosiland Oz,  MD Infectious Disease Physician [facility] for Infectious Disease Pager: [phone]

## 2021-02-13 NOTE — Progress Notes (Signed)
Pharmacy Antibiotic Note  Molly Arroyo is a 75 y.o. female admitted on 02/11/2021 with R foot osteomyelitis.  Pharmacy has been consulted for Vancomycin dosing. Hx of CVA, DM, HTN, CKD, thyroid disease, L renal complex cyst. SCr improving, down to 2.'7mg'$ /dL now. 1.45 L UOP yesterday, 200 mL charted so far today. Per ID, switching antibiotics to Unasyn.   Plan: Stop CTX, Vanc, Flagyl per ID Start Unasyn 3g q12h Monitor renal function for dose adjustments F/u plans for amputation, need for treatment vs suppressive antibotics  Height: '5\' 2"'$  (157.5 cm) Weight: 59.6 kg (131 lb 8 oz) IBW/kg (Calculated) : 50.1  Temp (24hrs), Avg:98.6 F (37 C), Min:98.4 F (36.9 C), Max:98.9 F (37.2 C)  Recent Labs  Lab 02/11/21 1640 02/11/21 2300 02/12/21 0341 02/13/21 0307  WBC 10.8*  --  11.9* 11.3*  CREATININE 3.37*  --  3.23* 2.70*  LATICACIDVEN  --  0.7 0.7  --     Estimated Creatinine Clearance: 14 mL/min (A) (by C-G formula based on SCr of 2.7 mg/dL (H)).    No Known Allergies  Antimicrobials this admission: Vanc 5/22 >> 5/24 Ceftriaxone 5/22 >> 5/24 Flagyl 5/22 >> 5/24 Unasyn 5/24 >>  Microbiology results: 5/22 BCx: ngtd 5/23 UCx: multp spp  Thank you for allowing pharmacy to be a part of this patient's care.  Fara Olden, PharmD PGY-1 Pharmacy Resident 02/13/2021 2:37 PM Please see AMION for all pharmacy numbers

## 2021-02-13 NOTE — Progress Notes (Addendum)
Subjective:   This morning, Molly Arroyo says she is feeling more awake and is having less tremors than yesterday, although says she's struggling with nausea and vomiting. She has vomited several times this morning. On reflection, notes report that she takes Zofran every morning before breakfast due to this. She endorses chills but denies fevers, abdominal pain, diarrhea, or constipation. She notes mild leg swelling bilaterally but says her right toe pain has improved since admission.   Objective:  Vital signs in last 24 hours: Vitals:   02/13/21 0007 02/13/21 0406 02/13/21 0712 02/13/21 1133  BP: (!) 136/57 (!) 136/56 (!) 150/66 (!) 170/66  Pulse: 69 65 72 77  Resp: '18 18 14 18  '$ Temp: 98.5 F (36.9 C) 98.5 F (36.9 C) 98.4 F (36.9 C) 98.6 F (37 C)  TempSrc: Oral Oral Oral Oral  SpO2: 92% 95% 95% 96%  Weight:  59.6 kg    Height:       General: Patient appears chronically ill and uncomfortable although in no acute distress. Eyes: Sclera non-icteric. No conjunctival injection. EOMI.  HENT: Slightly dry mucus membranes. No nasal discharge.  Respiratory: Anterior lung sounds CTA bilaterally. Normal work of breathing on room air.  Cardiovascular: RRR. No m/r/g. Trace bilateral LE edema. Distal extremities are warm. Neurological: Awake, alert and oriented x 4. Intact attention and short term memory. Mild myoclonus is present in bilateral upper extremities.  Abdominal: Abdomen is soft, non-tender and not distended. Bowel sounds are hyperactive throughout. No guarding or rebound.  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   Psych: Normal affect. Normal tone of voice.   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 woman with a past medical history significant for type 2 diabetes, hypertension, CKD stage IV, chronic pain on chronic opioids, CVA, mild cognitive impairment, spinal stenosis, HFpEF, hypothyroidism, GERD, R knee replacement, and recent hospitalization 11/23/20-12/02/20 for septic shock secondary to RLE cellulitis complicated by hypoxemic respiratory failure and AKI who presents from home with acute encephalopathy, admitted for further evaluation and management of acute encephalopathy and AKI in the setting of chronic opiate use, infection, and NAGMA.   Acute Encephalopathy, Multifactorial  Acute on Chronic N/V Etiology is likely multifactorial, due to uremia, NAGMA and poor opiate clearance in the setting of AKI on CKD and also due to acute on chronic infection. Neurological workup was negative and exam does not show focal abnormalities aside from myoclonus, currently. Her AMS has significantly improved and she is now A&O x 4.  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 Orthopedic surgery was consulted who do believe patient would benefit from amputation of right great toe as outpatient (either through her [facility] or with  Dr. Sharol Given) following discharge on suppressive antibiotics.  - Continue ceftriaxone, vancomycin and flagyl while admitted  - Plan to transition to PO ABX in the morning  - Blood cultures show no growth x 2 days  - Avoid morphine for pain relief (renal failure)   AKI on CKD stage IV  Non-Anion  Gap Metabolic Acidosis  Likely Uncomplicated Cystitis w/ Hematuria and Nephrotic Range Proteinuria  Hyperkalemia, Resolved  Nephrology were consulted due to AKI and it is thought this is likely pre-renal and due to ischemic insults in the setting of her cellulitis and U/A that is consistent with UTI. Her urine protein/Cr ratio is markedly elevated to 7,340. Her myoclonus and mental status have improved significantly with ABX and bicarb gtt. Initial urine culture grew multiple species.  - Nephrology following, appreciate their assistance - Repeat urine culture is pending (although may be falsely negative as she is already on ABX)  - ANA/ANCA pending (due to hematuria)  - Myeloma panel pending (for nephrotic range proteinuria)  - Will require outpatient urology follow up for   AKI on CKD stage IV Mild hyperkalemia Non-anion gap metabolic acidosis  On admission, Na 136, K 5.4, bicarb 13, BUN/Cr 55/3.37 (baseline creatinine 1.8-2). CK 376. I-stat VBG with pH 7.150, pCO2 38.4. NAGMA is likely secondary to renal insufficiency.  Lactic acid was normal. Renal ultrasound did show a multiseptated and lobulated 5.1x3.6x3.1cm left renal cystic lesion with increased renal parenchymal echogenicity suggestive of renal parenchymal disease.  - For now, continue sodium bicarb 150 mEq in sterile water at 100 cc/hr - Nephrology consulted, appreciate their recommendations  - Will continue to avoid nephrotoxic medications as able  - Recommend outpatient urology follow up for MRI to evaluate complex renal cyst   HFpEF (G1DD)  Last formal echo 06/2017 showed LVEF 60-65% with grade 1 diastolic dysfunction. Patient previously on Lasix however was held during recent hospitalization and has since not been resumed. She notes LE swelling although she appears nearly euvolemic on exam and weights are stable.  - Continue NaHCO3 75/hr  - Monitor fluid status closely with strict I/O, daily weights  Type 2 diabetes  mellitus Last A1c 6.9% on 11/28/20. On invokana, januvia, and glipizide outpatient. Glucose levels are currently normal although as oral intake increases, anticipate these will rise.  - Holding home medications for now due to renal function  - Will start very sensitive SSI  - CBG monitoring  - Will consider medication adjustments at discharge   Normocytic anemia, Stable  Hemoglobin dropped from 9.5 on admission to ~8 although remains stable without notable active bleeding. Iron studies are consistent with anemia of chronic disease.  - Continue to monitor daily CBC  HTN  Blood pressures are now elevated, up to 170/66 most recently. She takes amlodipine 10 mg, lisinopril 40 mg, and atenolol 50 mg daily at home.  - Will restart home amlodipine '10mg'$  daily  - PRN order placed for IV labetalol for SBP >180  - Will hold home lisinopril in setting of AKI and atenolol   Chronic pain on chronic opioids Spinal stenosis On Morphine 30 mg twice daily and oxycodone 5 mg twice daily as needed. Per most PCP note in Care Everywhere from 12/29/20, patient's PCP expressed concern that patient may be taking this more frequently than prescribed. - Holding home medications in setting of acute encephalopathy - Would benefit in switch to dilaudid if chronic opioids are needed given advanced kidney disease  Hypothyroidism  Last TSH 0.367 though in setting of  acute illness.  - Repeating TSH and free T4 - Will hold off on adding back home Synthroid in case this contributed to AMS (less likely)   Hx of CVA  - Resumed home Plavix    Anxiety, GERD, leg cramps/tremor - will restart home medications as AMS improves / able to tolerate PO  Code Status: Full code  Diet: Renal / CM diet (2L fluid restricted)  DVT PPx: Heparin  Fluids: NaHCO3 sterile water 75cc/hr  Prior to Admission Living Arrangement: Home, living with friend  Anticipated Discharge Location: Home vs. SNF, pending PT/OT evaluation  Barriers to  Discharge: AKI, Medical Stability   Jeralyn Bennett, MD 02/13/2021, 3:41 PM Pager: [phone] After 5pm on weekdays and 1pm on weekends: On Call pager [phone]

## 2021-02-13 NOTE — Consult Note (Signed)
Reason for Consult:Right great toe osteo Referring Physician: Orie Fisherman Time calledUP:[id number] Time at bedside: [place]   Molly Arroyo is an 75 y.o. female.  HPI: Molly Arroyo was admitted 2d ago with AMS likely 2/2 septic shock from RLE cellulitis. She had a similar e/o in March that required hospitalization. 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 The pt has improved much over the last 48h in regards to her MS and to the appearance of her foot. She denies pain, fevers, chills, sweats, or N/V. 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  Past Medical History:  Diagnosis Date  . Chronic kidney disease   . Diabetes mellitus without complication ([place])   . Hypertension   . Hypothyroidism   . Thyroid disease     History reviewed. No pertinent surgical history.  History reviewed. No pertinent family history.  Social History:  reports that she does not drink alcohol and does not use drugs. No history on file for tobacco use.  Allergies: No Known Allergies  Medications: I have reviewed the patient's current medications.  Results for orders placed or performed during the hospital encounter of 02/11/21 (from the past 48 hour(s))  Urinalysis, Routine w reflex microscopic Urine, Clean Catch     Status: Abnormal   Collection Time: 02/11/21  3:40 PM  Result Value Ref Range   Color, Urine YELLOW YELLOW   APPearance TURBID (A) CLEAR   Specific Gravity, Urine 1.009 1.005 - 1.030   pH 5.0 5.0 - 8.0   Glucose, UA >=500 (A) NEGATIVE mg/dL   Hgb urine dipstick MODERATE (A) NEGATIVE   Bilirubin Urine NEGATIVE NEGATIVE   Ketones, ur NEGATIVE NEGATIVE mg/dL   Protein, ur >=300 (A) NEGATIVE mg/dL   Nitrite NEGATIVE NEGATIVE   Leukocytes,Ua LARGE (A) NEGATIVE   RBC / HPF 11-20 0 - 5 RBC/hpf   WBC, UA >50 (H) 0 - 5 WBC/hpf   Bacteria, UA MANY (A) NONE SEEN    Squamous Epithelial / LPF 6-10 0 - 5   WBC Clumps PRESENT    Non Squamous Epithelial 0-5 (A) NONE SEEN    Comment: Performed at [facility], [address], [place], [place] [address]  Urine culture     Status: Abnormal   Collection Time: 02/11/21  3:40 PM   Specimen: Urine, Random  Result Value Ref Range   Specimen Description URINE, RANDOM    Special Requests      NONE Performed at [facility], [place] [address] [place], [place] [address]    Culture MULTIPLE SPECIES PRESENT, SUGGEST RECOLLECTION (A)    Report Status 02/12/2021 FINAL   Urine rapid drug screen (hosp performed)     Status: Abnormal   Collection Time: 02/11/21  3:40 PM  Result Value Ref Range   Opiates POSITIVE (A) NONE DETECTED   Cocaine NONE DETECTED NONE DETECTED   Benzodiazepines NONE DETECTED NONE DETECTED   Amphetamines NONE DETECTED NONE DETECTED   Tetrahydrocannabinol NONE DETECTED NONE DETECTED   Barbiturates NONE DETECTED NONE DETECTED    Comment: (NOTE) DRUG SCREEN FOR MEDICAL PURPOSES ONLY.  IF CONFIRMATION IS NEEDED FOR ANY PURPOSE, NOTIFY LAB WITHIN 5 DAYS.  LOWEST DETECTABLE LIMITS FOR URINE DRUG SCREEN Drug Class  Cutoff (ng/mL) Amphetamine and metabolites    1000 Barbiturate and metabolites    200 Benzodiazepine                 [id number] Tricyclics and metabolites     300 Opiates and metabolites        300 Cocaine and metabolites        300 THC                            50 Performed at [facility], [place] [address], [place], [place] [address]   Sodium, urine, random     Status: None   Collection Time: 02/11/21  3:40 PM  Result Value Ref Range   Sodium, Ur 56 mmol/L    Comment: Performed at [facility] [address], [place], [place] [address]  Creatinine, urine, random     Status: None   Collection Time: 02/11/21  3:40 PM  Result Value Ref Range   Creatinine, Urine 69.32 mg/dL    Comment: Performed at [facility], [place] [address]  [place], [place] [address]  Comprehensive metabolic panel     Status: Abnormal   Collection Time: 02/11/21  4:40 PM  Result Value Ref Range   Sodium 136 135 - 145 mmol/L   Potassium 5.4 (H) 3.5 - 5.1 mmol/L   Chloride 113 (H) 98 - 111 mmol/L   CO2 13 (L) 22 - 32 mmol/L   Glucose, Bld 147 (H) 70 - 99 mg/dL    Comment: Glucose reference range applies only to samples taken after fasting for at least 8 hours.   BUN 55 (H) 8 - 23 mg/dL   Creatinine, Ser 3.37 (H) 0.44 - 1.00 mg/dL   Calcium 8.3 (L) 8.9 - 10.3 mg/dL   Total Protein 6.4 (L) 6.5 - 8.1 g/dL   Albumin 3.0 (L) 3.5 - 5.0 g/dL   AST 16 15 - 41 U/L   ALT 7 0 - 44 U/L   Alkaline Phosphatase 50 38 - 126 U/L   Total Bilirubin 0.5 0.3 - 1.2 mg/dL   GFR, Estimated 14 (L) >60 mL/min    Comment: (NOTE) Calculated using the CKD-EPI Creatinine Equation (2021)    Anion gap 10 5 - 15    Comment: Performed at [facility], [place] [address] [place], [place] [address]  CBC with Differential     Status: Abnormal   Collection Time: 02/11/21  4:40 PM  Result Value Ref Range   WBC 10.8 (H) 4.0 - 10.5 K/uL   RBC 3.17 (L) 3.87 - 5.11 MIL/uL   Hemoglobin 9.5 (L) 12.0 - 15.0 g/dL   HCT 30.9 (L) 36.0 - 46.0 %   MCV 97.5 80.0 - 100.0 fL   MCH 30.0 26.0 - 34.0 pg   MCHC 30.7 30.0 - 36.0 g/dL   RDW 14.4 11.5 - 15.5 %   Platelets 233 150 - 400 K/uL   nRBC 0.0 0.0 - 0.2 %   Neutrophils Relative % 76 %   Neutro Abs 8.2 (H) 1.7 - 7.7 K/uL   Lymphocytes Relative 15 %   Lymphs Abs 1.6 0.7 - 4.0 K/uL   Monocytes Relative 7 %   Monocytes Absolute 0.8 0.1 - 1.0 K/uL   Eosinophils Relative 1 %   Eosinophils Absolute 0.1 0.0 - 0.5 K/uL   Basophils Relative 1 %   Basophils Absolute 0.1 0.0 - 0.1 K/uL  Immature Granulocytes 0 %   Abs Immature Granulocytes 0.04 0.00 - 0.07 K/uL    Comment: Performed at [facility], [place] [address], [place], [place] [address]  CK     Status: Abnormal   Collection Time: 02/11/21  4:40 PM  Result Value Ref Range    Total CK 376 (H) 38 - 234 U/L    Comment: Performed at [facility], [place] [address] [place], [place] [address]  Hemoglobin A1c     Status: Abnormal   Collection Time: 02/11/21  4:40 PM  Result Value Ref Range   Hgb A1c MFr Bld 6.8 (H) 4.8 - 5.6 %    Comment: (NOTE) Pre diabetes:          5.7%-6.4%  Diabetes:              >6.4%  Glycemic control for   <7.0% adults with diabetes    Mean Plasma Glucose 148.46 mg/dL    Comment: Performed at [facility] [address] [place], [place] [address]  SARS CORONAVIRUS 2 (TAT 6-24 HRS) Nasopharyngeal Nasopharyngeal Swab     Status: None   Collection Time: 02/11/21  5:52 PM   Specimen: Nasopharyngeal Swab  Result Value Ref Range   SARS Coronavirus 2 NEGATIVE NEGATIVE    Comment: (NOTE) SARS-CoV-2 target nucleic acids are NOT DETECTED.  The SARS-CoV-2 RNA is generally detectable in upper and lower respiratory specimens during the acute phase of infection. Negative results do not preclude SARS-CoV-2 infection, do not rule out co-infections with other pathogens, and should not be used as the sole basis for treatment or other patient management decisions. Negative results must be combined with clinical observations, patient history, and epidemiological information. The expected result is Negative.  Fact Sheet for Patients: SugarRoll.be  Fact Sheet for Healthcare Providers: https://www.woods-mathews.com/  This test is not yet approved or cleared by the [place] FDA and  has been authorized for detection and/or diagnosis of SARS-CoV-2 by FDA under an Emergency Use Authorization (EUA). This EUA will remain  in effect (meaning this test can be used) for the duration of the COVID-19 declaration under Se ction 564(b)(1) of the Act, 21 U.S.C. section 360bbb-3(b)(1), unless the authorization is terminated or revoked sooner.  Performed at [facility], [place] [address]  [place], [place] [address]   I-Stat venous blood gas, ED     Status: Abnormal   Collection Time: 02/11/21  6:45 PM  Result Value Ref Range   pH, Ven 7.150 (LL) 7.250 - 7.430   pCO2, Ven 38.4 (L) 44.0 - 60.0 mmHg   pO2, Ven 51.0 (H) 32.0 - 45.0 mmHg   Bicarbonate 13.4 (L) 20.0 - 28.0 mmol/L   TCO2 15 (L) 22 - 32 mmol/L   O2 Saturation 75.0 %   Acid-base deficit 14.0 (H) 0.0 - 2.0 mmol/L   Sodium 140 135 - 145 mmol/L   Potassium 5.1 3.5 - 5.1 mmol/L   Calcium, Ion 1.18 1.15 - 1.40 mmol/L   HCT 24.0 (L) 36.0 - 46.0 %   Hemoglobin 8.2 (L) 12.0 - 15.0 g/dL   Sample type VENOUS    Comment NOTIFIED PHYSICIAN   Culture, blood (routine x 2)     Status: None (Preliminary result)   Collection Time: 02/11/21  8:11 PM   Specimen: BLOOD  Result Value Ref Range   Specimen Description BLOOD LEFT ARM    Special Requests      BOTTLES DRAWN AEROBIC ONLY Blood  Culture results may not be optimal due to an inadequate volume of blood received in culture bottles   Culture      NO GROWTH 2 DAYS Performed at [facility] [address], [place], [place] [address]    Report Status PENDING   Culture, blood (routine x 2)     Status: None (Preliminary result)   Collection Time: 02/11/21  9:12 PM   Specimen: BLOOD  Result Value Ref Range   Specimen Description BLOOD RIGHT HAND    Special Requests      BOTTLES DRAWN AEROBIC AND ANAEROBIC Blood Culture results may not be optimal due to an inadequate volume of blood received in culture bottles   Culture      NO GROWTH 2 DAYS Performed at [facility], [place] [address], [place], [place] [address]    Report Status PENDING   Sedimentation rate     Status: None   Collection Time: 02/11/21  9:34 PM  Result Value Ref Range   Sed Rate 16 0 - 22 mm/hr    Comment: Performed at [facility], [place] [address] [place], [place] [address]  Lactic acid, plasma     Status: None   Collection Time: 02/11/21 11:00 PM  Result Value Ref Range   Lactic Acid,  Venous 0.7 0.5 - 1.9 mmol/L    Comment: Performed at [facility] [address], [place], [place] [address]  I-Stat arterial blood gas, ED     Status: Abnormal   Collection Time: 02/11/21 11:10 PM  Result Value Ref Range   pH, Arterial 7.152 (LL) 7.350 - 7.450   pCO2 arterial 32.2 32.0 - 48.0 mmHg   pO2, Arterial 72 (L) 83.0 - 108.0 mmHg   Bicarbonate 11.2 (L) 20.0 - 28.0 mmol/L   TCO2 12 (L) 22 - 32 mmol/L   O2 Saturation 89.0 %   Acid-base deficit 16.0 (H) 0.0 - 2.0 mmol/L   Sodium 139 135 - 145 mmol/L   Potassium 5.1 3.5 - 5.1 mmol/L   Calcium, Ion 1.23 1.15 - 1.40 mmol/L   HCT 24.0 (L) 36.0 - 46.0 %   Hemoglobin 8.2 (L) 12.0 - 15.0 g/dL   Patient temperature 98.8 F    Collection site Radial    Drawn by RT    Sample type ARTERIAL    Comment NOTIFIED PHYSICIAN   C-reactive protein     Status: None   Collection Time: 02/12/21  3:41 AM  Result Value Ref Range   CRP 0.8 <1.0 mg/dL    Comment: Performed at [facility], [place] [address] [place], [place] [address]  Lactic acid, plasma     Status: None   Collection Time: 02/12/21  3:41 AM  Result Value Ref Range   Lactic Acid, Venous 0.7 0.5 - 1.9 mmol/L    Comment: Performed at [facility] [address], [place], [place] [address]  Renal function panel     Status: Abnormal   Collection Time: 02/12/21  3:41 AM  Result Value Ref Range   Sodium 138 135 - 145 mmol/L   Potassium 5.4 (H) 3.5 - 5.1 mmol/L   Chloride 115 (H) 98 - 111 mmol/L   CO2 14 (L) 22 - 32 mmol/L   Glucose, Bld 121 (H) 70 - 99 mg/dL    Comment: Glucose reference range applies only to samples taken after fasting for at least 8 hours.   BUN 51 (H) 8 - 23  mg/dL   Creatinine, Ser 3.23 (H) 0.44 - 1.00 mg/dL   Calcium 8.1 (L) 8.9 - 10.3 mg/dL   Phosphorus 7.9 (H) 2.5 - 4.6 mg/dL   Albumin 2.7 (L) 3.5 - 5.0 g/dL   GFR, Estimated 14 (L) >60 mL/min    Comment: (NOTE) Calculated using the CKD-EPI Creatinine Equation (2021)    Anion gap 9 5 - 15     Comment: Performed at [facility] [address], [place], [place] [address]  CBC with Differential/Platelet     Status: Abnormal   Collection Time: 02/12/21  3:41 AM  Result Value Ref Range   WBC 11.9 (H) 4.0 - 10.5 K/uL   RBC 2.65 (L) 3.87 - 5.11 MIL/uL   Hemoglobin 8.0 (L) 12.0 - 15.0 g/dL   HCT 25.8 (L) 36.0 - 46.0 %   MCV 97.4 80.0 - 100.0 fL   MCH 30.2 26.0 - 34.0 pg   MCHC 31.0 30.0 - 36.0 g/dL   RDW 14.6 11.5 - 15.5 %   Platelets 235 150 - 400 K/uL   nRBC 0.0 0.0 - 0.2 %   Neutrophils Relative % 80 %   Neutro Abs 9.5 (H) 1.7 - 7.7 K/uL   Lymphocytes Relative 10 %   Lymphs Abs 1.2 0.7 - 4.0 K/uL   Monocytes Relative 8 %   Monocytes Absolute 1.0 0.1 - 1.0 K/uL   Eosinophils Relative 1 %   Eosinophils Absolute 0.2 0.0 - 0.5 K/uL   Basophils Relative 1 %   Basophils Absolute 0.1 0.0 - 0.1 K/uL   Immature Granulocytes 0 %   Abs Immature Granulocytes 0.05 0.00 - 0.07 K/uL    Comment: Performed at [facility] [address], [place], [place] [address]  Blood gas, venous     Status: Abnormal   Collection Time: 02/12/21  6:51 AM  Result Value Ref Range   FIO2 100.00    pH, Ven 7.162 (LL) 7.250 - 7.430    Comment: CRITICAL RESULT CALLED TO, READ BACK BY AND VERIFIED WITH: DALISAY,K RN @ [phone] 02/12/21 LEONARD,A    pCO2, Ven 36.8 (L) 44.0 - 60.0 mmHg   pO2, Ven 32.9 32.0 - 45.0 mmHg   Bicarbonate 12.6 (L) 20.0 - 28.0 mmol/L   Acid-base deficit 14.3 (H) 0.0 - 2.0 mmol/L   O2 Saturation 57.7 %   Patient temperature 37.0    Collection site LEFT HAND    Drawn by DRAWN BY RN    Sample type VENOUS     Comment: Performed at [facility], [place] [address] [place], [place] [address]  Iron and TIBC     Status: Abnormal   Collection Time: 02/12/21  4:00 PM  Result Value Ref Range   Iron 53 28 - 170 ug/dL   TIBC 183 (L) 250 - 450 ug/dL   Saturation Ratios 29 10.4 - 31.8 %   UIBC 130 ug/dL    Comment: Performed at [facility] [address] [place] [address]  [place], [place] [address]  C3 complement     Status: None   Collection Time: 02/12/21  5:06 PM  Result Value Ref Range   C3 Complement 115 82 - 167 mg/dL    Comment: (NOTE) Performed At: [facility] [place], [place] [id number] Rush Farmer MD [id number]   C4 complement     Status: None   Collection Time: 02/12/21  5:06 PM  Result Value Ref Range   Complement C4, Body Fluid  24 12 - 38 mg/dL    Comment: (NOTE) Performed At: [facility] [place], [place] [id number] Rush Farmer MD [id number]   Protein / creatinine ratio, urine     Status: Abnormal   Collection Time: 02/12/21  6:02 PM  Result Value Ref Range   Creatinine, Urine 38.28 mg/dL   Total Protein, Urine 281 mg/dL    Comment: RESULTS CONFIRMED BY MANUAL DILUTION   Protein Creatinine Ratio 7.34 (H) 0.00 - 0.15 mg/mg[Cre]    Comment: Performed at [facility] [address], [place], [place] [address]  Glucose, capillary     Status: None   Collection Time: 02/12/21  9:04 PM  Result Value Ref Range   Glucose-Capillary 97 70 - 99 mg/dL    Comment: Glucose reference range applies only to samples taken after fasting for at least 8 hours.  Glucose, capillary     Status: None   Collection Time: 02/13/21  1:04 AM  Result Value Ref Range   Glucose-Capillary 94 70 - 99 mg/dL    Comment: Glucose reference range applies only to samples taken after fasting for at least 8 hours.  Renal function panel     Status: Abnormal   Collection Time: 02/13/21  3:07 AM  Result Value Ref Range   Sodium 142 135 - 145 mmol/L   Potassium 4.2 3.5 - 5.1 mmol/L   Chloride 113 (H) 98 - 111 mmol/L   CO2 16 (L) 22 - 32 mmol/L   Glucose, Bld 79 70 - 99 mg/dL    Comment: Glucose reference range applies only to samples taken after fasting for at least 8 hours.   BUN 43 (H) 8 - 23 mg/dL   Creatinine, Ser 2.70 (H) 0.44 - 1.00 mg/dL   Calcium 8.0 (L) 8.9 - 10.3 mg/dL   Phosphorus 6.4 (H) 2.5 - 4.6  mg/dL   Albumin 2.7 (L) 3.5 - 5.0 g/dL   GFR, Estimated 18 (L) >60 mL/min    Comment: (NOTE) Calculated using the CKD-EPI Creatinine Equation (2021)    Anion gap 13 5 - 15    Comment: Performed at [facility] [address] [place], [place] [address]  CBC with Differential/Platelet     Status: Abnormal   Collection Time: 02/13/21  3:07 AM  Result Value Ref Range   WBC 11.3 (H) 4.0 - 10.5 K/uL   RBC 2.70 (L) 3.87 - 5.11 MIL/uL   Hemoglobin 8.2 (L) 12.0 - 15.0 g/dL   HCT 24.9 (L) 36.0 - 46.0 %   MCV 92.2 80.0 - 100.0 fL   MCH 30.4 26.0 - 34.0 pg   MCHC 32.9 30.0 - 36.0 g/dL   RDW 14.3 11.5 - 15.5 %   Platelets 232 150 - 400 K/uL   nRBC 0.0 0.0 - 0.2 %   Neutrophils Relative % 80 %   Neutro Abs 9.0 (H) 1.7 - 7.7 K/uL   Lymphocytes Relative 12 %   Lymphs Abs 1.4 0.7 - 4.0 K/uL   Monocytes Relative 6 %   Monocytes Absolute 0.7 0.1 - 1.0 K/uL   Eosinophils Relative 1 %   Eosinophils Absolute 0.1 0.0 - 0.5 K/uL   Basophils Relative 1 %   Basophils Absolute 0.1 0.0 - 0.1 K/uL   Immature Granulocytes 0 %   Abs Immature Granulocytes 0.03 0.00 - 0.07 K/uL    Comment: Performed at [facility], [place] [address] [place], [place] [address]  C-reactive protein  Status: Abnormal   Collection Time: 02/13/21  3:07 AM  Result Value Ref Range   CRP 1.9 (H) <1.0 mg/dL    Comment: Performed at [facility] [address] [place], [place] [address]  Sedimentation rate     Status: Abnormal   Collection Time: 02/13/21  3:07 AM  Result Value Ref Range   Sed Rate 46 (H) 0 - 22 mm/hr    Comment: Performed at [facility] [address] [place], [place] [address]  Glucose, capillary     Status: None   Collection Time: 02/13/21  4:13 AM  Result Value Ref Range   Glucose-Capillary 78 70 - 99 mg/dL    Comment: Glucose reference range applies only to samples taken after fasting for at least 8 hours.  Glucose, capillary     Status: None   Collection Time: 02/13/21   7:11 AM  Result Value Ref Range   Glucose-Capillary 86 70 - 99 mg/dL    Comment: Glucose reference range applies only to samples taken after fasting for at least 8 hours.    DG Chest 2 View  Result Date: 02/11/2021 CLINICAL DATA:  Pain after fall. Fall walking to the bathroom in the middle the night. EXAM: CHEST - 2 VIEW COMPARISON:  None available. FINDINGS: Elevated right hemidiaphragm with mild volume loss in the right hemithorax. Heart size normal for degree of inspiration. Aortic atherosclerosis and tortuosity. There are streaky bibasilar opacities. Peribronchial thickening. No pleural effusion or pneumothorax. Bones diffusely under mineralized. Chronic change of both shoulders. Possible left anterior sixth rib fracture, age indeterminate. IMPRESSION: 1. Possible left anterior sixth rib fracture, age indeterminate. 2. Elevated right hemidiaphragm. Streaky bibasilar opacities may represent atelectasis or scarring. Aortic Atherosclerosis (ICD10-I70.0). Electronically Signed   By: Keith Rake M.D.   On: 02/11/2021 16:17   DG Pelvis 1-2 Views  Result Date: 02/11/2021 CLINICAL DATA:  Pain after fall. Fall walking to the bathroom in the middle the night. EXAM: PELVIS - 1-2 VIEW COMPARISON:  None available. FINDINGS: The bones are diffusely under mineralized. In conjunction with soft tissue attenuation from habitus, evaluation for acute fracture is limited. Allowing for this, no fracture is seen. Both femoral heads are well seated in the acetabulum. Pubic rami appear intact. Pubic symphysis and sacroiliac joints are congruent. Degenerative change in the included lower lumbar spine. IMPRESSION: No fracture of the pelvis.  Underlying osteopenia/osteoporosis. Electronically Signed   By: Keith Rake M.D.   On: 02/11/2021 16:18   CT Head Wo Contrast  Result Date: 02/11/2021 CLINICAL DATA:  Mental status change. Patient is leaning to the right. Fall going to the bathroom last night. EXAM: CT HEAD  WITHOUT CONTRAST TECHNIQUE: Contiguous axial images were obtained from the base of the skull through the vertex without intravenous contrast. COMPARISON:  None. FINDINGS: Brain: No evidence of acute infarction, hemorrhage, hydrocephalus, extra-axial collection or mass lesion/mass effect. Brain volume is normal for age. There is mild periventricular and deep white matter hypodensity, typically chronic small vessel ischemia. Vascular: Atherosclerosis of skullbase vasculature without hyperdense vessel or abnormal calcification. Skull: No fracture or focal lesion. Sinuses/Orbits: Paranasal sinuses and mastoid air cells are clear. The visualized orbits are unremarkable. Bilateral cataract resection. Other: None. IMPRESSION: 1. No acute intracranial abnormality. No skull fracture. 2. Mild chronic small vessel ischemia. Electronically Signed   By: Keith Rake M.D.   On: 02/11/2021 16:26   MR BRAIN WO CONTRAST  Result Date: 02/11/2021 CLINICAL DATA:  Fall with loss  of balance EXAM: MRI HEAD WITHOUT CONTRAST TECHNIQUE: Multiplanar, multiecho pulse sequences of the brain and surrounding structures were obtained without intravenous contrast. COMPARISON:  None. FINDINGS: Brain: No acute infarct, mass effect or extra-axial collection. No acute or chronic hemorrhage. Normal white matter signal, parenchymal volume and CSF spaces. The midline structures are normal. Vascular: Major flow voids are preserved. Skull and upper cervical spine: Normal calvarium and skull base. Visualized upper cervical spine and soft tissues are normal. Sinuses/Orbits:No paranasal sinus fluid levels or advanced mucosal thickening. No mastoid or middle ear effusion. Normal orbits. IMPRESSION: Normal brain MRI. Electronically Signed   By: Ulyses Jarred M.D.   On: 02/11/2021 22:34   US RENAL  Result Date: 02/12/2021 CLINICAL DATA:  Acute on chronic renal disease. EXAM: RENAL / URINARY TRACT ULTRASOUND COMPLETE COMPARISON:  None. FINDINGS: Right  Kidney: Renal measurements: 11.3 x 5.6 x 5.2 cm = volume: 171 mL. Renal cortical thinning. Echogenicity is increased. Couple of simple renal cysts measuring up to 2.6 cm are noted. No hydronephrosis visualized. Left Kidney: Renal measurements: 10 x 4.7 x 5.7 cm = volume: 139 mL. Echogenicity is increased. There is a multiseptated and lobulated poorly visualized 5.1 x 3.6 x 3.1 cm cystic lesion which could represent several separate cysts. Question associated mural nodularity. No hydronephrosis visualized. Urinary bladder: Appears normal for degree of bladder distention. Other: None. IMPRESSION: 1. Multiseptated and lobulated poorly visualized 5.1 x 3.6 x 3.1 cm left renal cystic lesion which could represent several separate cysts some of which are complex in appearance. Comparison with prior cross-sectional imaging would be of value. If not available, recommend nonemergent MRI with renal protocol further evaluation. 2. Increased renal parenchymal echogenicity suggestive of renal parenchymal disease. Electronically Signed   By: Iven Finn M.D.   On: 02/12/2021 02:04   MR FOOT RIGHT WO CONTRAST  Result Date: 02/12/2021 CLINICAL DATA:  Osteomyelitis, foot EXAM: MRI OF THE RIGHT FOREFOOT WITHOUT CONTRAST TECHNIQUE: Multiplanar, multisequence MR imaging of the right forefoot was performed. No intravenous contrast was administered. COMPARISON:  Right foot radiograph 02/11/2021 FINDINGS: Motion degraded exam. Bones/Joint/Cartilage Prior amputation of the third toe. There is increased signal within the great toe distal phalanx with likely mild low T1 signal, difficult to fully evaluate due to motion artifact. 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 Ligaments Poorly evaluated due to motion artifact. Ossification along the Lisfranc ligament. Muscles and Tendons Intramuscular edema in the foot commonly seen in diabetes. Mild muscle atrophy. Soft tissues Mild generalized soft tissue  swelling. Likely ulcer adjacent to the great toe distal phalanx as seen on recent radiograph. 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 Electronically Signed   By: Maurine Simmering   On: 02/12/2021 15:47   DG Foot 2 Views Right  Result Date: 02/11/2021 CLINICAL DATA:  Dorsal right foot ulcer. EXAM: RIGHT FOOT - 2 VIEW COMPARISON:  None. FINDINGS: The second, fourth and fifth right toes are flexed in position and subsequently limited in evaluation. The phalanges of the third right toe are likely surgically absent. A small cortical defect is seen along the medial aspect of the base of the distal phalanx of the right great toe. An adjacent 7 mm x 3 mm right great toe soft tissue ulceration is noted. Benign-appearing periarticular lucent areas are seen involving the right great toe, with chronic deformities involving the base of the first right metatarsal and distal aspect of the second right metatarsal. A small to  moderate sized plantar calcaneal spur is noted with marked severity degenerative changes seen within the mid right foot. IMPRESSION: 1. Right great toe ulceration with a small area of suspected acute osteomyelitis. MRI correlation is recommended. 2. Chronic, postoperative and degenerative changes throughout the right foot, as described above. Electronically Signed   By: Virgina Norfolk M.D.   On: 02/11/2021 22:20   EEG adult  Result Date: 02/12/2021 Lora Havens, MD     02/12/2021  9:41 AM Patient Name: Barbette Pasko MRN: [id number] Epilepsy Attending: Lora Havens Referring Physician/Provider: Dr Alexandria Lodge Date: 02/12/2021 Duration: 25.39 mins Patient history: 75 year old female with altered mental status and diffuse myoclonus.  EEG to evaluate for seizures. Level of alertness: Awake AEDs during EEG study: None Technical aspects: This EEG study was done with scalp electrodes positioned according to the 10-20  International system of electrode placement. Electrical activity was acquired at a sampling rate of '500Hz'$  and reviewed with a high frequency filter of '70Hz'$  and a low frequency filter of '1Hz'$ . EEG data were recorded continuously and digitally stored. Description: No clear posterior dominant rhythm was seen.  EEG showed continuous generalized polymorphic 3 to 5 Hz theta-delta slowing.  At times patient was noted to have intermittent nonrhythmic twitching of right upper extremity. Concomitant EEG before, during and after the event did not show any EEG to suggest seizure.  Hyperventilation and photic stimulation were not performed.   ABNORMALITY - Continuous slow, generalized IMPRESSION: This study is suggestive of moderate diffuse encephalopathy, nonspecific etiology. No seizures or epileptiform discharges were seen throughout the recording. Patient was noted to have intermittent right upper extremity twitching without concomitant EEG change.  This was NOT an epileptic event. Priyanka O Yadav   VAS [place] ABI WITH/WO TBI  Result Date: 02/12/2021  LOWER EXTREMITY DOPPLER STUDY Patient Name:  SAM HECTOR  Date of Exam:   02/12/2021 Medical Rec #: [id number]         Accession #:    [id number] Date of Birth: 21-Jul-1946         Patient Gender: F Patient Age:   4Y Exam Location:  [facility] Procedure:      VAS [place] ABI WITH/WO TBI Referring Phys: [place] --------------------------------------------------------------------------------  Indications: Ulceration, and Right lower extremity cellulitis. High Risk Factors: Hypertension, Diabetes, prior CVA. Other Factors: Stage IV CKD.  Limitations: Today's exam was limited due to patient positioning and Confusion,              constant movement, would not turn supine. Comparison Study: No prior study Performing Technologist: Sharion Dove RVS  Examination Guidelines: A complete evaluation includes at minimum, Doppler waveform signals and systolic blood  pressure reading at the level of bilateral brachial, anterior tibial, and posterior tibial arteries, when vessel segments are accessible. Bilateral testing is considered an integral part of a complete examination. Photoelectric Plethysmograph (PPG) waveforms and toe systolic pressure readings are included as required and additional duplex testing as needed. Limited examinations for reoccurring indications may be performed as noted.  ABI Findings: +---------+------------------+-----+----------+-----------------+ Right    Rt Pressure (mmHg)IndexWaveform  Comment           +---------+------------------+-----+----------+-----------------+ PTA      146               1.03 monophasic                  +---------+------------------+-----+----------+-----------------+ DP       145  1.02 monophasic                  +---------+------------------+-----+----------+-----------------+ Great Toe                                 constant movement +---------+------------------+-----+----------+-----------------+ +---------+------------------+-----+----------+--------------------------------+ Left     Lt Pressure (mmHg)IndexWaveform  Comment                          +---------+------------------+-----+----------+--------------------------------+ Brachial 142                                                               +---------+------------------+-----+----------+--------------------------------+ PTA                                       not insonated secondary to                                                 patient's AMS and positioning    +---------+------------------+-----+----------+--------------------------------+ DP       255               1.80 monophasic                                 +---------+------------------+-----+----------+--------------------------------+ Great Toe                                 constant movement                 +---------+------------------+-----+----------+--------------------------------+ +-------+-----------+-----------+------------+------------+ ABI/TBIToday's ABIToday's TBIPrevious ABIPrevious TBI +-------+-----------+-----------+------------+------------+ Right  1.03                                           +-------+-----------+-----------+------------+------------+ Left   1.8                                            +-------+-----------+-----------+------------+------------+  Summary: Right: Resting right ankle-brachial index is within normal range. No evidence of significant right lower extremity arterial disease. ABIs are unreliable. Left: Resting left ankle-brachial index indicates noncompressible left lower extremity arteries. ABIs are unreliable.  *See table(s) above for measurements and observations.  Electronically signed by Monica Martinez MD on 02/12/2021 at 3:29:37 PM.    Final    VAS [place] LOWER EXTREMITY VENOUS (DVT)  Result Date: 02/12/2021  Lower Venous DVT Study Patient Name:  QADIRA FELLA  Date of Exam:   02/12/2021 Medical Rec #: [id number]         Accession #:    [id number] Date of Birth: [birth year]         Patient Gender: F Patient Age:   81Y Exam Location:  [facility] Procedure:      VAS [place] LOWER EXTREMITY VENOUS (DVT) Referring Phys: [id number] ELIZABETH REES --------------------------------------------------------------------------------  Indications: Pitting edema, cellulitis.  Limitations: Altered mental status. Positioning-patient refused to turn from right side to supine. Comparison Study: No prior study on file Performing Technologist: Sharion Dove RVS  Examination Guidelines: A complete evaluation includes B-mode imaging, spectral Doppler, color Doppler, and power Doppler as needed of all accessible portions of each vessel. Bilateral testing is considered an integral part of a complete examination. Limited examinations for reoccurring indications may be  performed as noted. The reflux portion of the exam is performed with the patient in reverse Trendelenburg.  +---------+---------------+---------+-----------+----------+--------------+ RIGHT    CompressibilityPhasicitySpontaneityPropertiesThrombus Aging +---------+---------------+---------+-----------+----------+--------------+ CFV      Full                                         pulsatile      +---------+---------------+---------+-----------+----------+--------------+ SFJ      Full                                                        +---------+---------------+---------+-----------+----------+--------------+ FV Prox  Full                                         pulsatile      +---------+---------------+---------+-----------+----------+--------------+ FV Mid   Full                                                        +---------+---------------+---------+-----------+----------+--------------+ FV DistalFull                                         pulsatile      +---------+---------------+---------+-----------+----------+--------------+ PFV                                                   pulsatile      +---------+---------------+---------+-----------+----------+--------------+ POP      Full                                         pulsatile      +---------+---------------+---------+-----------+----------+--------------+ PTV      Full                                                        +---------+---------------+---------+-----------+----------+--------------+ PERO     Full                                                        +---------+---------------+---------+-----------+----------+--------------+  Left Technical Findings: Left leg not evaluated. Patient would not turn.   Summary: RIGHT: - No evidence of deep vein thrombosis in the lower extremity. No indirect evidence of obstruction proximal to the inguinal ligament. Pulsatile flow  noted throughout, consistent with fluid overload   *See table(s) above for measurements and observations. Electronically signed by Monica Martinez MD on 02/12/2021 at 3:30:57 PM.    Final     Review of Systems  Constitutional: Negative for chills, diaphoresis and fever.  HENT: Negative for ear discharge, ear pain, hearing loss and tinnitus.   Eyes: Negative for photophobia and pain.  Respiratory: Negative for cough and shortness of breath.   Cardiovascular: Negative for chest pain.  Gastrointestinal: Negative for abdominal pain, nausea and vomiting.  Genitourinary: Negative for dysuria, flank pain, frequency and urgency.  Musculoskeletal: Negative for arthralgias, back pain, myalgias and neck pain.  Neurological: Negative for dizziness and headaches.  Hematological: Does not bruise/bleed easily.  Psychiatric/Behavioral: The patient is not nervous/anxious.    Blood pressure (!) 150/66, pulse 72, temperature 98.4 F (36.9 C), temperature source Oral, resp. rate 14, height '5\' 2"'$  (1.575 m), weight 59.6 kg, SpO2 95 %. Physical Exam Constitutional:      General: She is not in acute distress.    Appearance: She is well-developed. She is not diaphoretic.  HENT:     Head: Normocephalic and atraumatic.  Eyes:     General: No scleral icterus.       Right eye: No discharge.        Left eye: No discharge.     Conjunctiva/sclera: Conjunctivae normal.  Cardiovascular:     Rate and Rhythm: Normal rate and regular rhythm.  Pulmonary:     Effort: Pulmonary effort is normal. No respiratory distress.  Musculoskeletal:     Cervical back: Normal range of motion.  Feet:     Comments: Right foot: mild erythema hallux, shallow ulcerations to plantar hallux and dorsal forefoot, no dicharge, no TTP, sensation intact SPN, DPN, TN, 2+ DP, 0 PT Skin:    General: Skin is warm and dry.  Neurological:     Mental Status: She is alert.  Psychiatric:        Behavior: Behavior normal.      Assessment/Plan: Right great toe osteo -- This pt would benefit from amputation of the great toe given her recent history. Would recommend discharging on suppressive antibiotics with plans to f/u with her [facility] surgeon for surgical planning or with Dr. Sharol Given here in town if she wants to change her care team.  Multiple medical problems including ype 2 diabetes, hypertension, stage IV CKD, CVA, recent hospitalization 11/23/20-12/02/20 for septic shock secondary to RLE cellulitis complicated by hypoxemic respiratory failure and AKI, mild cognitive impairment, spinal stenosis, chronic pain on chronic opioids, HFpEF, hypothyroidism, GERD, and R knee replacement -- per primary service    Lisette Abu, PA-C Orthopedic Surgery [phone] 02/13/2021, 10:48 AM

## 2021-02-14 DIAGNOSIS — N184 Chronic kidney disease, stage 4 (severe): Secondary | ICD-10-CM | POA: Diagnosis not present

## 2021-02-14 DIAGNOSIS — G9340 Encephalopathy, unspecified: Secondary | ICD-10-CM | POA: Diagnosis not present

## 2021-02-14 DIAGNOSIS — N179 Acute kidney failure, unspecified: Secondary | ICD-10-CM | POA: Diagnosis not present

## 2021-02-14 DIAGNOSIS — E118 Type 2 diabetes mellitus with unspecified complications: Secondary | ICD-10-CM | POA: Diagnosis not present

## 2021-02-14 LAB — GLUCOSE, CAPILLARY
Glucose-Capillary: 77 mg/dL (ref 70–99)
Glucose-Capillary: 146 mg/dL — ABNORMAL HIGH (ref 70–99)
Glucose-Capillary: 127 mg/dL — ABNORMAL HIGH (ref 70–99)
Glucose-Capillary: 127 mg/dL — ABNORMAL HIGH (ref 70–99)

## 2021-02-14 LAB — CBC WITH DIFFERENTIAL/PLATELET
Abs Immature Granulocytes: 0.05 10*3/uL (ref 0.00–0.07)
Basophils Absolute: 0.1 10*3/uL (ref 0.0–0.1)
Basophils Relative: 1 %
Eosinophils Absolute: 0.2 10*3/uL (ref 0.0–0.5)
Eosinophils Relative: 2 %
HCT: 23.6 % — ABNORMAL LOW (ref 36.0–46.0)
Hemoglobin: 7.5 g/dL — ABNORMAL LOW (ref 12.0–15.0)
Immature Granulocytes: 0 %
Lymphocytes Relative: 15 %
Lymphs Abs: 1.8 10*3/uL (ref 0.7–4.0)
MCH: 29.8 pg (ref 26.0–34.0)
MCHC: 31.8 g/dL (ref 30.0–36.0)
MCV: 93.7 fL (ref 80.0–100.0)
Monocytes Absolute: 1 10*3/uL (ref 0.1–1.0)
Monocytes Relative: 8 %
Neutro Abs: 8.7 10*3/uL — ABNORMAL HIGH (ref 1.7–7.7)
Neutrophils Relative %: 74 %
Platelets: 212 10*3/uL (ref 150–400)
RBC: 2.52 MIL/uL — ABNORMAL LOW (ref 3.87–5.11)
RDW: 14.3 % (ref 11.5–15.5)
WBC: 11.8 10*3/uL — ABNORMAL HIGH (ref 4.0–10.5)
nRBC: 0 % (ref 0.0–0.2)

## 2021-02-14 LAB — RENAL FUNCTION PANEL
Albumin: 2.5 g/dL — ABNORMAL LOW (ref 3.5–5.0)
Anion gap: 12 (ref 5–15)
BUN: 39 mg/dL — ABNORMAL HIGH (ref 8–23)
CO2: 19 mmol/L — ABNORMAL LOW (ref 22–32)
Calcium: 7.8 mg/dL — ABNORMAL LOW (ref 8.9–10.3)
Chloride: 107 mmol/L (ref 98–111)
Creatinine, Ser: 2.58 mg/dL — ABNORMAL HIGH (ref 0.44–1.00)
GFR, Estimated: 19 mL/min — ABNORMAL LOW (ref >60)
Glucose, Bld: 81 mg/dL (ref 70–99)
Phosphorus: 4.7 mg/dL — ABNORMAL HIGH (ref 2.5–4.6)
Potassium: 4.1 mmol/L (ref 3.5–5.1)
Sodium: 138 mmol/L (ref 135–145)

## 2021-02-14 LAB — MPO/PR-3 (ANCA) ANTIBODIES
ANCA Proteinase 3: <3.5 U/mL (ref 0.0–3.5)
Myeloperoxidase Abs: <9 U/mL (ref 0.0–9.0)

## 2021-02-14 LAB — TYPE AND SCREEN
ABO/RH(D): O POS
Antibody Screen: NEGATIVE

## 2021-02-14 LAB — KAPPA/LAMBDA LIGHT CHAINS
Kappa free light chain: 88.9 mg/L — ABNORMAL HIGH (ref 3.3–19.4)
Kappa, lambda light chain ratio: 1.88 — ABNORMAL HIGH (ref 0.26–1.65)
Lambda free light chains: 47.3 mg/L — ABNORMAL HIGH (ref 5.7–26.3)

## 2021-02-14 LAB — URINE CULTURE: Culture: 70000 — AB

## 2021-02-14 LAB — ABO/RH: ABO/RH(D): O POS

## 2021-02-14 MED ORDER — JANUVIA 50 MG PO TABS: 25.0000 mg | ORAL_TABLET | Freq: Every day | ORAL | 0 refills | Status: DC

## 2021-02-14 MED ORDER — ATENOLOL 25 MG PO TABS
25.0000 mg | ORAL_TABLET | Freq: Every day | ORAL | Status: DC
  Administered 2021-02-14: 25 mg via ORAL
  Filled 2021-02-14 (×3): qty 1

## 2021-02-14 MED ORDER — LORATADINE 10 MG PO TABS
10.0000 mg | ORAL_TABLET | Freq: Every day | ORAL | Status: DC | PRN
  Administered 2021-02-15: 10 mg via ORAL
  Filled 2021-02-14: qty 1

## 2021-02-14 MED ORDER — RAMELTEON 8 MG PO TABS
8.0000 mg | ORAL_TABLET | Freq: Every day | ORAL | Status: DC
  Administered 2021-02-15: 8 mg via ORAL
  Filled 2021-02-14 (×2): qty 1

## 2021-02-14 MED ORDER — ONDANSETRON HCL 4 MG PO TABS: 4.0000 mg | ORAL_TABLET | Freq: Four times a day (QID) | ORAL | 0 refills | Status: DC | PRN

## 2021-02-14 MED ORDER — ATENOLOL 50 MG PO TABS: 25.0000 mg | ORAL_TABLET | Freq: Every day | ORAL | 0 refills | Status: DC

## 2021-02-14 MED ORDER — ACETAMINOPHEN 325 MG PO TABS: 650.0000 mg | ORAL_TABLET | Freq: Four times a day (QID) | ORAL | Status: DC | PRN

## 2021-02-14 MED ORDER — FUROSEMIDE 20 MG PO TABS: 20.0000 mg | ORAL_TABLET | Freq: Every day | ORAL | 0 refills | Status: DC | PRN

## 2021-02-14 MED ORDER — AMOXICILLIN-POT CLAVULANATE 875-125 MG PO TABS: 1.0000 | ORAL_TABLET | Freq: Two times a day (BID) | ORAL | 0 refills | Status: DC

## 2021-02-14 MED ORDER — AMLODIPINE BESYLATE 10 MG PO TABS: 10.0000 mg | ORAL_TABLET | Freq: Every day | ORAL | 0 refills | Status: DC

## 2021-02-14 MED ORDER — INVOKANA 100 MG PO TABS: 100.0000 mg | ORAL_TABLET | Freq: Every day | ORAL | 0 refills | Status: DC

## 2021-02-14 NOTE — Evaluation (Signed)
Clinical/Bedside Swallow Evaluation Patient Details  Name: Molly Arroyo MRN: [id number] Date of Birth: Aug 25, 1946  Today's Date: 02/14/2021 Time: SLP Start Time (ACUTE ONLY): 0944 SLP Stop Time (ACUTE ONLY): 0958 SLP Time Calculation (min) (ACUTE ONLY): 14 min  Past Medical History:  Past Medical History:  Diagnosis Date  . Chronic kidney disease   . Diabetes mellitus without complication ([place])   . Hypertension   . Hypothyroidism   . Thyroid disease    Past Surgical History: History reviewed. No pertinent surgical history. HPI:  Pt is a 75 yo female presenting with acute encephalopathy amd AKI in the setting of chronic opiate use, infection, and NAGMA. PMH includes: DMII, HTN, CKD, chronic pain, CVA, mild cognitive impairment, spinal stenosis, HFpEF, hypothyroidism, GERD, and recent hospitalization for septic shock from RLE cellulitis. Pt had a previous BSE at outside hospital in May 2020 with regular solids and honey thick liquids recommended due to myoclonus. She was advanced to thin liquids that admission prior to discharge.   Assessment / Plan / Recommendation Clinical Impression  Pt's oropharyngeal swallowing appears to be [facility] other than needing additional time to masticate regular textures. She does say that she eats regular textures at baseline (nuts, grapes) and does not want to try a softer diet here. When observed to eat something too hard for her to chew, pt did ultimately remove a portion from her mouth, indicating appropriate safety awareness. Recommend continuing regular solids and thin liquids. No acute SLP f/u indicated. SLP Visit Diagnosis: Dysphagia, unspecified (R13.10)    Aspiration Risk  Mild aspiration risk    Diet Recommendation Regular;Thin liquid   Liquid Administration via: Cup;Straw Medication Administration: Whole meds with liquid Supervision: Patient able to self feed;Intermittent supervision to cue for compensatory strategies Compensations: Slow  rate;Small sips/bites Postural Changes: Seated upright at 90 degrees;Remain upright for at least 30 minutes after po intake    Other  Recommendations Oral Care Recommendations: Oral care BID   Follow up Recommendations None      Frequency and Duration            Prognosis        Swallow Study   General HPI: Pt is a 75 yo female presenting with acute encephalopathy amd AKI in the setting of chronic opiate use, infection, and NAGMA. PMH includes: DMII, HTN, CKD, chronic pain, CVA, mild cognitive impairment, spinal stenosis, HFpEF, hypothyroidism, GERD, and recent hospitalization for septic shock from RLE cellulitis. Pt had a previous BSE at outside hospital in May 2020 with regular solids and honey thick liquids recommended due to myoclonus. She was advanced to thin liquids that admission prior to discharge. Type of Study: Bedside Swallow Evaluation Previous Swallow Assessment: see HPI Diet Prior to this Study: Regular;Thin liquids Temperature Spikes Noted: No Respiratory Status: Room air History of Recent Intubation: No Behavior/Cognition: Alert;Cooperative;Pleasant mood;Other (Comment) (HOH) Oral Cavity Assessment: Within Functional Limits Oral Care Completed by SLP: No Oral Cavity - Dentition: Dentures, top;Dentures, bottom Vision: Functional for self-feeding Self-Feeding Abilities: Able to feed self Patient Positioning: Upright in bed Baseline Vocal Quality: Normal Volitional Cough: Strong Volitional Swallow: Able to elicit    Oral/Motor/Sensory Function Overall Oral Motor/Sensory Function: Within functional limits   Ice Chips Ice chips: Not tested   Thin Liquid Thin Liquid: Within functional limits Presentation: Self Fed;Straw    Nectar Thick Nectar Thick Liquid: Not tested   Honey Thick Honey Thick Liquid: Not tested   Puree Puree: Within functional limits Presentation: Self Fed;Spoon   Solid  Solid: Within functional limits Presentation: Self Fed       Osie Bond., M.A. [place] Pager [phone] Office [phone]  02/14/2021,10:03 AM

## 2021-02-14 NOTE — Evaluation (Signed)
Occupational Therapy Evaluation Patient Details Name: Molly Arroyo MRN: [id number] DOB: 1945-10-10 Today's Date: 02/14/2021    History of Present Illness Pt is a 75 y.o. female admitted 02/11/21 with AMS. Workup for likely septic shock from RLE cellulitis, AKI, UTI, R foot wound. Ortho consult for R great toe osteomyelitis and need for amputation. PMH includes CKD, DM, HF, HTN, spinal stenosis, chronic pain on opioids, toe amputation, R TKA.   Clinical Impression   PTA, pt lives with daughter and reports Modified Independent with mobility, toileting and dressing self. Daughter assists with showering tasks to ensure safety, as pt has endorsed hx of falls. Pt presents now with mild R toe pain, but able to mobilize to/from bathroom using RW at Supervision level. Intermittent cues needed for safe DME use though no overt LOB noted. Pt overall Setup for UB ADLs and min guard for LB ADLs due to deficts. Anticipate no skilled OT services needed on DC as pt is close to baseline. However, pt would benefit from acute OT to maximize independence with ADLs and decrease fall risk. VSS on RA.     Follow Up Recommendations  No OT follow up;Supervision - Intermittent    Equipment Recommendations  None recommended by OT (appears well equipped)    Recommendations for Other Services       Precautions / Restrictions Precautions Precautions: Fall;Other (comment) Precaution Comments: R toe osteomyelitis, no WB precautions noted in chart Restrictions Weight Bearing Restrictions: No Other Position/Activity Restrictions: No WB orders noted in chart, sent secure chat to ortho PA 5/25 @ 8:30 AM - awaiting response; assumed WBAT      Mobility Bed Mobility Overal bed mobility: Needs Assistance Bed Mobility: Sit to Supine       Sit to supine: Supervision   General bed mobility comments: for safety, no physical assist needed    Transfers Overall transfer level: Needs assistance Equipment used: Rolling  walker (2 wheeled) Transfers: Sit to/from [facility] Sit to Stand: Supervision Stand pivot transfers: Supervision       General transfer comment: quick to rise but no overt LOB, able to pivot to toilet and from chair to bed at end of session    Balance Overall balance assessment: Needs assistance;History of Falls Sitting-balance support: No upper extremity supported;Feet supported Sitting balance-Leahy Scale: Good     Standing balance support: No upper extremity supported;During functional activity Standing balance-Leahy Scale: Fair Standing balance comment: fair static standing, use of UE support for mobility                           ADL either performed or assessed with clinical judgement   ADL Overall ADL's : Needs assistance/impaired Eating/Feeding: Independent;Sitting   Grooming: Set up;Sitting;Oral care Grooming Details (indicate cue type and reason): Setup for denture care in recliner Upper Body Bathing: Set up;Sitting   Lower Body Bathing: Min guard;Sit to/from stand   Upper Body Dressing : Set up;Sitting   Lower Body Dressing: Min guard;Sit to/from stand   Toilet Transfer: Supervision/safety;Ambulation;Regular Toilet;RW Toilet Transfer Details (indicate cue type and reason): no overt LOB, limping gait though [facility] Toileting- Clothing Manipulation and Hygiene: Supervision/safety;Sit to/from stand Toileting - Clothing Manipulation Details (indicate cue type and reason): able to wipe self without difficulty     Functional mobility during ADLs: Supervision/safety;Rolling walker;Cueing for safety;Cueing for sequencing General ADL Comments: appears close to baseline, minor deficits in R toe pain and cues for safety in RW use  Vision Patient Visual Report: No change from baseline Vision Assessment?: No apparent visual deficits     Perception     Praxis      Pertinent Vitals/Pain Pain Assessment: Faces Faces Pain Scale: Hurts a  little bit Pain Location: R toe Pain Descriptors / Indicators: Sore Pain Intervention(s): Monitored during session     Hand Dominance Right   Extremity/Trunk Assessment Upper Extremity Assessment Upper Extremity Assessment: LUE deficits/detail LUE Deficits / Details: limited L shoulder AROM to about 45* - difficulty reaching   Lower Extremity Assessment Lower Extremity Assessment: Defer to PT evaluation   Cervical / Trunk Assessment Cervical / Trunk Assessment: Kyphotic   Communication Communication Communication: HOH   Cognition Arousal/Alertness: Awake/alert Behavior During Therapy: WFL for tasks assessed/performed Overall Cognitive Status: No family/caregiver present to determine baseline cognitive functioning                                 General Comments: appears at baseline cognitively, able to answer all orientation questions   General Comments  HR 70s-90s during activity.    Exercises     Shoulder Instructions      Home Living Family/patient expects to be discharged to:: Private residence Living Arrangements: Children Available Help at Discharge: Family;Available 24 hours/day Type of Home: Mobile home Home Access: Stairs to enter Entrance Stairs-Number of Steps: 6 Entrance Stairs-Rails: Right;Left Home Layout: One level     Bathroom Shower/Tub: Teacher, early years/pre: Standard     Home Equipment: Environmental consultant - 2 wheels;Shower seat;Bedside commode;Hand held shower head          Prior Functioning/Environment Level of Independence: Needs assistance  Gait / Transfers Assistance Needed: use of RW for mobility, reports hx of falls but unsure of cause ADL's / Homemaking Assistance Needed: Able to dress, toilet self. Assist for showering tasks. Family does IADLs            OT Problem List: Decreased strength;Decreased activity tolerance;Impaired balance (sitting and/or standing);Decreased safety awareness;Decreased knowledge of  use of DME or AE;Pain      OT Treatment/Interventions: Self-care/ADL training;Therapeutic exercise;DME and/or AE instruction;Therapeutic activities;Patient/family education;Balance training    OT Goals(Current goals can be found in the care plan section) Acute Rehab OT Goals Patient Stated Goal: be able to go home soon OT Goal Formulation: With patient Time For Goal Achievement: 02/28/21 Potential to Achieve Goals: Good ADL Goals Pt Will Perform Grooming: with modified independence;standing Pt Will Perform Lower Body Dressing: with modified independence;sit to/from stand;sitting/lateral leans Pt Will Transfer to Toilet: with modified independence;ambulating Additional ADL Goal #1: Pt to verbalize at least 2 fall prevention strategies to implement in home environment  OT Frequency: Min 2X/week   Barriers to D/C:            Co-evaluation              AM-PAC OT "6 Clicks" Daily Activity     Outcome Measure Help from another person eating meals?: None Help from another person taking care of personal grooming?: A Little Help from another person toileting, which includes using toliet, bedpan, or urinal?: A Little Help from another person bathing (including washing, rinsing, drying)?: A Little Help from another person to put on and taking off regular upper body clothing?: A Little Help from another person to put on and taking off regular lower body clothing?: A Little 6 Click Score: 19   End of Session Equipment  Utilized During Treatment: Gait belt;Rolling walker Nurse Communication: Mobility status;Other (comment) (request for meds for runny nose)  Activity Tolerance: Patient tolerated treatment well Patient left: in bed;with call bell/phone within reach;with bed alarm set  OT Visit Diagnosis: Unsteadiness on feet (R26.81);Other abnormalities of gait and mobility (R26.89);Muscle weakness (generalized) (M62.81);History of falling (Z91.81);Pain Pain - Right/Left: Right Pain -  part of body: Ankle and joints of foot                Time: 1249-1312 OT Time Calculation (min): 23 min Charges:  OT General Charges $OT Visit: 1 Visit OT Evaluation $OT Eval Low Complexity: 1 Low OT Treatments $Self Care/Home Management : 8-22 mins  Malachy Chamber, OTR/L Acute Rehab Services Office: [phone]  Layla Maw 02/14/2021, 1:28 PM

## 2021-02-14 NOTE — Progress Notes (Signed)
Pt states her belongings ([id number], driver's license, Medicare card) were put into a lock box while she was in the MRI department. RN called MRI department, but was told that there are no belongings in any of the lockers. RN attempted to check pt's bed and room, but pt became angry and stated she knows her belongings are not in the bed. RN called pt's daughter and verified that they are not at home. Security was called. Security stated that her belongings are not in lost and found. RN attempted to check pt's bed and room again while MD was in the room. Pt became angry again. Will continue to monitor.

## 2021-02-14 NOTE — Evaluation (Signed)
Physical Therapy Evaluation Patient Details Name: Molly Arroyo MRN: [id number] DOB: 01-17-1946 Today's Date: 02/14/2021   History of Present Illness  Pt is a 75 y.o. female admitted 02/11/21 with AMS. Workup for likely septic shock from RLE cellulitis, AKI, UTI, R foot wound. Ortho consult for R great toe osteomyelitis and need for amputation. PMH includes CKD, DM, HF, HTN, spinal stenosis, chronic pain on opioids, toe amputation, R TKA.    Clinical Impression  Pt presents with an overall decrease in functional mobility secondary to above. PTA, pt mod indep ambulating with walker; lives with daughter who assists with ADLs/iADLs as needed. Today, pt moving well with RW and supervision for safety; antalgic gait with decreased WB through RLE secondary to pain. Pt would benefit from continued acute PT services to maximize functional mobility and independence prior to d/c home.     Follow Up Recommendations No PT follow up;Supervision for mobility/OOB    Equipment Recommendations  None recommended by PT    Recommendations for Other Services       Precautions / Restrictions Precautions Precautions: Fall;Other (comment) Precaution Comments: R great toe wound w/ osteomyelitis Restrictions Weight Bearing Restrictions: No Other Position/Activity Restrictions: No WB orders noted in chart; OT sent secure chat to ortho PA 5/25 @ 8:30 AM - awaiting response; performed WBAT with use of RW to offload RLE this session      Mobility  Bed Mobility Overal bed mobility: Needs Assistance Bed Mobility: Sit to Supine       Sit to supine: Supervision   General bed mobility comments: received sitting on BSC    Transfers Overall transfer level: Needs assistance Equipment used: Rolling walker (2 wheeled) Transfers: Sit to/from Stand Sit to Stand: Supervision Stand pivot transfers: Supervision       General transfer comment: quick to rise but no overt LOB, able to pivot to toilet and from  chair to bed at end of session  Ambulation/Gait Ambulation/Gait assistance: Supervision Gait Distance (Feet): 6 Feet Assistive device: Rolling walker (2 wheeled) Gait Pattern/deviations: Step-through pattern;Decreased stride length;Decreased weight shift to right;Antalgic;Trunk flexed Gait velocity: Decreased   General Gait Details: Slow, antalgic gait with RW and supervision for safety; pt declined further distance secondary to fatigue  Stairs            Wheelchair Mobility    Modified Rankin (Stroke Patients Only)       Balance Overall balance assessment: Needs assistance;History of Falls Sitting-balance support: No upper extremity supported;Feet supported Sitting balance-Leahy Scale: Good Sitting balance - Comments: indep with pericare sitting on BSC   Standing balance support: No upper extremity supported;During functional activity Standing balance-Leahy Scale: Fair Standing balance comment: Can static stand without UE support; dynamic stability improved with RW                             Pertinent Vitals/Pain Pain Assessment: Faces Faces Pain Scale: Hurts a little bit Pain Location: R toe Pain Descriptors / Indicators: Sore Pain Intervention(s): Monitored during session;Limited activity within patient's tolerance    Home Living Family/patient expects to be discharged to:: Private residence Living Arrangements: Children Available Help at Discharge: Family;Available 24 hours/day Type of Home: Mobile home Home Access: Stairs to enter Entrance Stairs-Rails: Right;Left Entrance Stairs-Number of Steps: 5-6 Home Layout: One level Home Equipment: Walker - 2 wheels;Shower seat;Bedside commode;Hand held shower head      Prior Function Level of Independence: Needs assistance   Gait / Transfers  Assistance Needed: Mod indep with RW for short ambulation distances; h/o falls, unsure of cause. Pt drives. Reports she enjoys fishing  ADL's / Homemaking  Assistance Needed: Able to dress, toilet self. Daughter assists with showering tasks. Family does IADLs        Hand Dominance   Dominant Hand: Right    Extremity/Trunk Assessment   Upper Extremity Assessment Upper Extremity Assessment: LUE deficits/detail;Generalized weakness LUE Deficits / Details: limited L shoulder AROM to about 45* - difficulty reaching    Lower Extremity Assessment Lower Extremity Assessment: Generalized weakness;RLE deficits/detail RLE Deficits / Details: R great toe wound w/ osteomyelitis    Cervical / Trunk Assessment Cervical / Trunk Assessment: Kyphotic  Communication   Communication: HOH  Cognition Arousal/Alertness: Awake/alert Behavior During Therapy: WFL for tasks assessed/performed Overall Cognitive Status: No family/caregiver present to determine baseline cognitive functioning                                 General Comments: WFL for simple tasks; requires some repetition secondary to [facility]. Suspect near baseline cognition      General Comments General comments (skin integrity, edema, etc.): HR 70s-90s during activity.    Exercises     Assessment/Plan    PT Assessment Patient needs continued PT services  PT Problem List Decreased strength;Decreased activity tolerance;Decreased balance;Decreased mobility;Decreased knowledge of precautions;Pain       PT Treatment Interventions DME instruction;Gait training;Stair training;Functional mobility training;Therapeutic activities;Therapeutic exercise;Balance training;Patient/family education    PT Goals (Current goals can be found in the Care Plan section)  Acute Rehab PT Goals Patient Stated Goal: be able to go home soon PT Goal Formulation: With patient Time For Goal Achievement: 02/28/21 Potential to Achieve Goals: Good    Frequency Min 3X/week   Barriers to discharge        Co-evaluation               AM-PAC PT "6 Clicks" Mobility  Outcome Measure Help  needed turning from your back to your side while in a flat bed without using bedrails?: None Help needed moving from lying on your back to sitting on the side of a flat bed without using bedrails?: None Help needed moving to and from a bed to a chair (including a wheelchair)?: A Little Help needed standing up from a chair using your arms (e.g., wheelchair or bedside chair)?: A Little Help needed to walk in hospital room?: A Little Help needed climbing 3-5 steps with a railing? : A Little 6 Click Score: 20    End of Session   Activity Tolerance: Patient tolerated treatment well;Patient limited by fatigue Patient left: in chair;with call bell/phone within reach;with chair alarm set Nurse Communication: Mobility status PT Visit Diagnosis: Other abnormalities of gait and mobility (R26.89);Muscle weakness (generalized) (M62.81)    Time: [id number] PT Time Calculation (min) (ACUTE ONLY): 19 min   Charges:   PT Evaluation $PT Eval Low Complexity: Springer, PT, DPT Acute Rehabilitation Services  Pager [phone] Office [place] 02/14/2021, 2:06 PM

## 2021-02-14 NOTE — Progress Notes (Addendum)
Subjective:   No acute events were reported overnight.   This morning, [name] reports some nausea although denies vomiting and says this is chronic. Her nausea improved after Zofran which she confirms she takes daily before breakfast at home. Nausea does tend to be worse after eating. She notes some history of heartburn but denies CP otherwise and denies abdominal pain or changes in stools. She says she has some chronic light-headedness but denies dyspnea. She says this is why she uses a walker at home. She feels her legs are swollen although doesn't believe this is new.   Objective:  Vital signs in last 24 hours: Vitals:   02/14/21 0752 02/14/21 1146 02/14/21 1531 02/14/21 1658  BP: (!) 143/66   (!) 162/73  Pulse: 68 70 67 71  Resp: (!) 21   15  Temp: 98 F (36.7 C) 98.4 F (36.9 C) 98.7 F (37.1 C) 99.6 F (37.6 C)  TempSrc: Oral Oral Oral Oral  SpO2: 91% 96%  95%  Weight:      Height:       General: Patient appears chronically ill although comfortable in no acute distress. Eyes: Sclera non-icteric. No conjunctival injection. EOMI.  HENT: MMM. No nasal discharge.  Respiratory: Anterior lung sounds CTA bilaterally. Normal work of breathing on room air.  Cardiovascular: RRR. No m/r/g. No appreciable LE edema. Extremities are warm and well perfused.  Neurological: Awake, alert and oriented x 4. Intact attention and short term memory. She does have a tremor consistent with essential tremor although myoclonus has nearly resolved. No acute focal neurological deficits noted.  Abdominal: Abdomen is soft, non-tender and not distended. Bowel sounds are intact throughout. No guarding or rebound.  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   Skin: Right toe remains erythematous with a punctate lesion although without active drainage. Lesion on dorsal right foot is crusted and scabbed. No other lesions noted.  Psych: Normal affect. Normal tone of voice.   Assessment/Plan:  Principal  Problem:   Acute encephalopathy Active Problems:   Acute renal failure superimposed on stage 4 chronic kidney disease (HCC)   Type 2 diabetes mellitus with renal complication (HCC)   Microscopic hematuria   Urinary tract infection   Diabetic ulcer of right midfoot associated with type 2 diabetes mellitus, with fat layer exposed ([place])   Diabetic ulcer of right great toe (HCC)   Cellulitis of right lower extremity   Acute metabolic encephalopathy  Molly Arroyo is a 75 y.o. woman with a past medical history significant for type 2 diabetes, hypertension, CKD stage IV, chronic pain on chronic opioids, CVA, mild cognitive impairment, spinal stenosis, HFpEF, hypothyroidism, GERD, R knee replacement, and recent hospitalization 11/23/20-12/02/20 for septic shock secondary to RLE cellulitis complicated by hypoxemic respiratory failure and AKI who presents from home with acute encephalopathy, admitted for further evaluation and management of acute encephalopathy and AKI in the setting of chronic opiate use, infection, and NAGMA.    Acute Encephalopathy, Multifactorial, Resolved  Acute on Chronic N/V Etiology is likely multifactorial, due to uremia, NAGMA and poor opiate clearance in the setting of AKI on CKD and also due to acute on chronic infection. Neurological workup was negative and her myoclonus has nearly resolved. Her AMS has also resolved and she is now A&O x 4.  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 Orthopedic surgery was consulted who do believe patient would benefit from  amputation of right great toe as outpatient. At patient's request, an outpatient appointment was scheduled with Dr. Sharol Given 02/20/21. - Will transition from unasyn to Augmentin on discharge - Blood cultures show no growth x 3 days  AKI on CKD stage IV  Non-Anion Gap Metabolic Acidosis  Hematuria and Nephrotic Range Proteinuria  Hyperkalemia, Resolved  Nephrology were consulted due to AKI and it is thought this is likely pre-renal and due to ischemic insults in the setting of her cellulitis and U/A that is consistent with UTI. Her BUN and creatinine continues to improve daily although remains above her baseline (~1.8-2). Her urine protein/Cr ratio is markedly elevated to 7,340. ANA and ANCA are pending. Initial urine culture showed multiple species and recollection showed 70,000 colonies of yeast. She does have a history of candidal UTI although is currently denies any urinary symptoms.   - Nephrology have signed off, appreciate their assistance  - Scheduled outpatient follow up w/ [facility] 03/07/21 - ANA/ANCA pending (due to hematuria)  - Myeloma panel pending (for nephrotic range proteinuria)  - Will require outpatient urology follow up for complex renal cyst   HFpEF (G1DD)  Last formal echo 06/2017 showed LVEF 60-65% with grade 1 diastolic dysfunction. Patient previously on Lasix however was held during recent hospitalization and has since not been resumed.  - Bicarb drip / IVF's discontinued  - Will resume Lasix PRN on discharge    Type 2 diabetes mellitus Last A1c 6.9% on 11/28/20. On invokana, januvia, and glipizide outpatient. Glucose levels continue to be on the lower side.  - Holding home medications for now due to renal function  - Started very sensitive SSI  - CBG monitoring  - Will consider medication adjustments at discharge    Normocytic anemia, Stable  Hemoglobin dropped from 9.5 on admission to 7.5 this morning. Patient has some light-headedness although this is  chronic. She has no documented CAD.  - Will hold transfusion given Hgb >7  HTN  Blood pressures continue to be elevated [id number] systolic despite addition of amlodipine yesterday.   - Continue home amlodipine '10mg'$  daily  - Added Atenolol '25mg'$  daily  - PRN IV labetalol for SBP >180  - Will hold lisinopril now and on discharge due to AKI  - Patient will be stable for discharge home if BP is controlled on repeat assessment this evening   Chronic pain on chronic opioids Spinal stenosis On Morphine 30 mg twice daily and oxycodone 5 mg twice daily as needed. Per most PCP note in Care Everywhere from 12/29/20, patient's PCP expressed concern that patient may be taking this more frequently than prescribed. - Holding home medications in setting of acute encephalopathy - She has not needed pain medication here. Will hold on prescribing pain medications on discharge but recommend switching to dilaudid to avoid accumulation of opiates in her system    Hypothyroidism, Resolved   Last TSH 0.367 though in setting of acute illness. Repeat thyroid tests during this admission were normal.  - Will hold off on adding back home Synthroid and recommend outpatient follow up  Hx of CVA  - Continue Plavix  Anxiety, GERD, leg cramps/tremor - restarted lower dose atenolol  Code Status: Full code  Diet: Renal / CM diet (2L fluid restricted)  DVT PPx: Heparin  Fluids: None   Prior to Admission Living Arrangement: Home, living with daughter Anticipated Discharge Location: Home  Barriers to Discharge: Hypertension - should be stable for discharge early this evening   Jeralyn Bennett, MD 02/14/2021, 5:07 PM Pager: [phone] After 5pm on weekdays and 1pm on weekends: On Call pager [phone]

## 2021-02-14 NOTE — Progress Notes (Signed)
OT Cancellation Note  Patient Details Name: Molly Arroyo MRN: [id number] DOB: 1946/08/27   Cancelled Treatment:    Reason Eval/Treat Not Completed: Other (comment) Pt eating lunch on entry. Will follow-up for OT eval.  Layla Maw 02/14/2021, 12:26 PM

## 2021-02-14 NOTE — Discharge Summary (Signed)
Name: Molly Arroyo MRN: [id number] DOB: 06-18-1946 75 y.o. PCP: No primary care provider on file.  Date of Admission: 02/11/2021  2:49 PM Date of Discharge: 02/15/2021 Attending Physician: Dr. Aldine Contes  Discharge Diagnosis: 1. Acute Encephalopathy (Resolved)  2. AKI on CKD Stage IV 3. Non-Anion Gap Metabolic Acidosis  4. Hematuria  5. Nephrotic-Range Proteinuria  6. Hypokalemia  7. 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 Acute on Chronic Nausea and Vomiting  9. Acute Diarrhea  10. HFpEF (Grade 1 Diastolic Dysfunction)  11. NIDDM Type II  12. Normocytic Anemia  13. HTN  14. Chronic Pain on Chronic Opioids   Discharge Medications: Allergies as of 02/15/2021   No Known Allergies     Medication List    STOP taking these medications   glipiZIDE 10 MG tablet Commonly known as: GLUCOTROL   levothyroxine 100 MCG tablet Commonly known as: SYNTHROID   lisinopril 10 MG tablet Commonly known as: ZESTRIL   morphine 30 MG 12 hr tablet Commonly known as: MS CONTIN   oxyCODONE 5 MG immediate release tablet Commonly known as: Oxy IR/ROXICODONE     TAKE these medications   acetaminophen 325 MG tablet Commonly known as: TYLENOL Take 2 tablets (650 mg total) by mouth every 6 (six) hours as needed for mild pain, moderate pain or headache (or Fever >/= 101).   amLODipine 10 MG tablet Commonly known as: NORVASC Take 1 tablet (10 mg total) by mouth daily.   amoxicillin-clavulanate 500-125 MG tablet Commonly known as: Augmentin Take 1 tablet (500 mg total) by mouth 2 (two) times daily.   atenolol 50 MG tablet Commonly known as: TENORMIN Take 1 tablet (50 mg total) by mouth daily.   clopidogrel 75 MG tablet Commonly known as: PLAVIX Take 75 mg by mouth daily.   furosemide 20 MG tablet Commonly known as: LASIX Take 1 tablet (20 mg total) by mouth daily as needed for edema (for weight gain >3 lbs in 1 day of >5lbs in 1 week or worsening lower  extremity swelling / shortness of breath laying down). What changed:   when to take this  reasons to take this   HYDROmorphone 4 MG tablet Commonly known as: Dilaudid Take 2 tablets (8 mg total) by mouth every 12 (twelve) hours as needed for up to 7 days for severe pain.   Invokana 100 MG Tabs tablet Generic drug: canagliflozin Take 1 tablet (100 mg total) by mouth daily.   Januvia 50 MG tablet Generic drug: sitaGLIPtin Take 0.5 tablets (25 mg total) by mouth daily. What changed: how much to take   ondansetron 4 MG tablet Commonly known as: ZOFRAN Take 1 tablet (4 mg total) by mouth every 6 (six) hours as needed for nausea or vomiting.   rOPINIRole 1 MG tablet Commonly known as: REQUIP Take 1 mg by mouth at bedtime as needed for other. Leg cramps       Disposition and follow-up:   Molly Arroyo was discharged from [facility] in Stable condition.  At the hospital follow up visit please address:  Please Follow Up:  1. Acute Encephalopathy (Resolved) - Assess patient's mental status +/- cognitive impairment  2. AKI on CKD Stage IV / NAGMA / Hypo-K / Chronic Anemia - Repeat labs and ensure nephrology follow up (? Need for outpatient NaHCO3) 3. Hematuria - Repeat U/A in 4-6 weeks to monitor for resolution  4. Nephrotic-Range Proteinuria - Continue to monitor and review myeloma panel labs with  patient  5. 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 Sharol Given for right great toe amputation. Monitor glucose closely and maintain strict control. Please assign stop date on Augmentin. Ensure wound care follow up. 6. Acute on Chronic Nausea and Vomiting - Patient reportedly takes Zofran before breakfast every morning. Symptoms did resolve int he hospital with Zofran, although consider gastric emptying study (and Reglan) for possible gastroparesis in the setting of DM.   7. Acute Diarrhea - Patient developed diarrhea while  in the hospital that did not seem consistent with C. Diff. However, please consider C. Diff (and ? H. Pylori testing) and further workup if diarrhea persists outside hospital. 8. HFpEF (Grade 1 Diastolic Dysfunction) - Monitor fluid status  9. HTN - Pressures remained elevated although she was restarted on amlodipine with atenolol '50mg'$  just prior to discharge. Held lisinopril given AKI. Continue monitoring and consider switching to propranolol for ?essential tremors and add back lisinopril once renal function stabilizes. 10. Chronic Pain on Chronic Opioids - Reported taking morphine '30mg'$  twice daily and oxycodone '10mg'$  twice daily (rather than '5mg'$  twice daily as prescribed). Per PCP note 12/29/20, there was concern patient may be taking opioids more frequently than prescribed. I prescribed a short 7-day course of PO dilaudid prior to discharge to prevent renal injury with Morphine / Oxycodone. Please assess pain levels and consider pain management referral and prescribing Dilaudid rather than above agents in the future.   2.  Labs / imaging needed at time of follow-up: RFP, Magnesium, CBC w/ Diff, consider eventual gastric emptying study   3.  Labs/ test needing followed-up: Myeloma Panel Results, Hematuria/Proteinuria, R toe Amputation    Follow-up Appointments:  Follow-up Information    Newt Minion, MD. Schedule an appointment as soon as possible for a visit on 02/20/2021.   Specialty: Orthopedic Surgery Why: Please attend your appointment with Dr. Sharol Given on Tuesday 02/20/21 at 1:15pm with Dr. Sharol Given. Contact information: [address] [place] [address] [phone]        Dr. Einar Crow. Go on 03/07/2021.   Why: Please bring your medications with you to your new appointment 03/07/21 at 11:15am with Dr. Einar Crow (kidney doctor). Contact information: [facility] [place], [place], [place] [address] [phone] Alt: [place]. Schedule an appointment as soon as possible for a visit.   Why: Please call to schedule an appointment with urology as soon as you are able or get referral to alternative provider by your primary care doctor. Contact information: [place] [phone]              Hospital Course by problem list:  Alesya Castellani a 75 y.o. woman with a past medical history significant for type 2 diabetes, hypertension, CKDstage IV, chronic pain on chronic opioids,CVA,mild cognitive impairment, spinal stenosis,HFpEF, hypothyroidism,GERD, R knee replacement, and recenthospitalization3/3/22-3/12/22 for septic shock secondary to RLE cellulitiscomplicated by hypoxemic respiratory failure and AKI who presentsfrom homewith acute encephalopathy, admitted for further evaluation and management of acute encephalopathy and AKI in the setting of chronic opiate use, right toe osteomyelitis requiring outpatient amputation, and NAGMA that has resolved.  (See day prior to discharge note for summary):  Acute Encephalopathy, Multifactorial, Resolved  Patient presented to [facility] ED after being found by her roommate one day prior s/p unwitnessed fall with generalized weakness and disorientation, initially was  somnolent, oriented only to self, with diffuse myoclonus. Per daughter, she is typically oriented x 4 and able to walk on her own. Imaging was unremarkable (as above) and EEG did not show any signs of seizures. Her encephalopathy was thought to be multifactorial, due to uremia, NAGMA, active infection (UTI and cellulitis) and toxic due to opioid buildup in her system, with concern for overdosing at home (in the setting of AKI on CKD). At the time of discharge, she was awake, alert and oriented x 4 with improvement in chronic essential tremor but no evidence of myoclonus, and improvement in her infection on IV ABX although with need for ongoing treatment and  likely R great toe amputation outpatient, with short course of Dilaudid prescribed instead for pain. She was discharged home to her blind, elderly friend [name] (where she states she had lived previously). Declined SNF and instead went home with [facility] services with close follow up with nephrology and orthopedics scheduled.   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 Sharol Given 02/20/21 to discuss amputation of her right great toe due to osteomyelitis. She is currently on Unasyn. WBC has normalized.  - Transitioning to Augmentin '500mg'$  BID on discharge (stop date to be determined by ortho)  - Blood cultures showed no growth  AKI on CKD stage IV, Improving  Non-Anion Gap Metabolic Acidosis, Resolved  Hematuria and Nephrotic Range Proteinuria  Hypokalemia Nephrology were consulted due to AKI and it is thought this is likely pre-renal and due to ischemic insults in the setting of her cellulitis and U/A that is consistent with UTI. Her BUN and creatinine continues to improve daily, now close to her baseline (~1.8-2), NAGMA and myoclonus have resolved. Potassium is slightly low now. Her urine protein/Cr ratio is markedly elevated to 7,340. Her free light chains are 1.88 (as expected with CKD). ANA and ANCA are negative. Initial urine culture showed multiple species and recollection showed 70,000 colonies of yeast. She does have a history of candidal UTI although continues to deny urinary symptoms.  - Gave 30mq KCl via packet  - Nephrology signed off, appreciate their assistance  - [facility] - SPEP and UPEP pending (for nephrotic range proteinuria) - Will need follow up of her myeloma panel - Provided contact information for urology follow up for complex renal cyst   Acute on Chronic N/V Acute Diarrhea  Patient notes her morning nausea and vomiting are chronic and have improved with Zofran PRN  and every morning as she says she uses this at home. She does note 4 episodes of loose stools daily over the past couple of days although without odor or color change. This is most likely due to ABX while in the hospital although may be due to opioid withdrawal as she is on these daily at home and they were held this [place] due [place]/ renal fx. - Continue Zofran on discharge  - Consider C. Diff / H. Pylori testing if symptoms are persistent or worsening following discharge  HFpEF(G1DD) Last formal echo 06/2017 showed LVEF 60-65% with grade 1 diastolic dysfunction. Patient previously on Lasix however was held during recent hospitalization and has since not been resumed.  - Resumed Lasix PRN (symptoms/weight gain) on discharge. Will need ongoing fluid status assessment    Type 2 diabetes mellitus Last A1c 6.9% on 11/28/20. On invokana, januvia, and glipizide outpatient. Glucose levels continue to be on the  lower side.  - Restarting Invokana and low-dose Januvia on discharge  - Will hold glipizide on discharge  - Will require close outpatient follow up   Normocytic anemia, Stable  - Did not require transfusion during admission given Hgb persistently >7 (around her baseline) w/o documented CAD. Denied any symptoms throughout admission.   HTN Blood pressures have come down since overnight with up-titration of atenolol to home dose.  - Continue home amlodipine '10mg'$  daily  - Start and continue atenolol '50mg'$  daily at discharge  - Continuing to hold lisinopril until she has nephrology follow up due to AKI on CKD IV  - PRN IV labetalol for SBP >180    Chronic pain on chronic opioids Spinal stenosis OnMorphine 30 mg twice dailyand oxycodone 5 mgtwice dailyas needed (although patient states '10mg'$  BID). Per most PCP note in Care Everywhere from 12/29/20, patient's PCP expressed concern that patient may be taking this more frequently than prescribed. - Patient had done well without opioids in  the hospital but notes she needs these for activity at home  - Gave short 7 day course of dilaudid PO on discharge which will require refill and avoidance of aforementioned due to concern for renal injury and buildup in her system  Hypothyroidism, Resolved  Last TSH 0.367 though in setting of acute illness.Repeat thyroid tests during this admission were normal.  - Will hold off on adding back home Synthroid and recommend outpatient follow up  Hx of CVA - Continue Plavix   Anxiety, GERD, leg cramps/tremor - at baseline; held ropinerole during admission given worse renal function and she tolerated this well. She did have myoclonus associated with uremia, although this resolved during admission. Her essential tremor improved with atenolol although consider switch to propranolol at discharge.    Discharge Exam:   BP (!) 153/70 (BP Location: Left Arm)   Pulse 60   Temp 98.3 F (36.8 C) (Oral)   Resp 20   Ht '5\' 2"'$  (1.575 m)   Wt 60.2 kg   SpO2 100%   BMI 24.27 kg/m  Discharge exam: Please see my progress note from today for full subjective and objective info including PE.   Pertinent Labs, Studies, and Procedures:   DG Chest 2 View  Result Date: 02/11/2021 CLINICAL DATA:  Pain after fall. Fall walking to the bathroom in the middle the night. EXAM: CHEST - 2 VIEW COMPARISON:  None available. FINDINGS: Elevated right hemidiaphragm with mild volume loss in the right hemithorax. Heart size normal for degree of inspiration. Aortic atherosclerosis and tortuosity. There are streaky bibasilar opacities. Peribronchial thickening. No pleural effusion or pneumothorax. Bones diffusely under mineralized. Chronic change of both shoulders. Possible left anterior sixth rib fracture, age indeterminate. IMPRESSION: 1. Possible left anterior sixth rib fracture, age indeterminate. 2. Elevated right hemidiaphragm. Streaky bibasilar opacities may represent atelectasis or scarring. Aortic Atherosclerosis  (ICD10-I70.0). Electronically Signed   By: Keith Rake M.D.   On: 02/11/2021 16:17   DG Pelvis 1-2 Views  Result Date: 02/11/2021 CLINICAL DATA:  Pain after fall. Fall walking to the bathroom in the middle the night. EXAM: PELVIS - 1-2 VIEW COMPARISON:  None available. FINDINGS: The bones are diffusely under mineralized. In conjunction with soft tissue attenuation from habitus, evaluation for acute fracture is limited. Allowing for this, no fracture is seen. Both femoral heads are well seated in the acetabulum. Pubic rami appear intact. Pubic symphysis and sacroiliac joints are congruent. Degenerative change in the included lower lumbar spine. IMPRESSION: No fracture of  the pelvis.  Underlying osteopenia/osteoporosis. Electronically Signed   By: Keith Rake M.D.   On: 02/11/2021 16:18   CT Head Wo Contrast  Result Date: 02/11/2021 CLINICAL DATA:  Mental status change. Patient is leaning to the right. Fall going to the bathroom last night. EXAM: CT HEAD WITHOUT CONTRAST TECHNIQUE: Contiguous axial images were obtained from the base of the skull through the vertex without intravenous contrast. COMPARISON:  None. FINDINGS: Brain: No evidence of acute infarction, hemorrhage, hydrocephalus, extra-axial collection or mass lesion/mass effect. Brain volume is normal for age. There is mild periventricular and deep white matter hypodensity, typically chronic small vessel ischemia. Vascular: Atherosclerosis of skullbase vasculature without hyperdense vessel or abnormal calcification. Skull: No fracture or focal lesion. Sinuses/Orbits: Paranasal sinuses and mastoid air cells are clear. The visualized orbits are unremarkable. Bilateral cataract resection. Other: None. IMPRESSION: 1. No acute intracranial abnormality. No skull fracture. 2. Mild chronic small vessel ischemia. Electronically Signed   By: Keith Rake M.D.   On: 02/11/2021 16:26   MR BRAIN WO CONTRAST  Result Date: 02/11/2021 CLINICAL DATA:   Fall with loss of balance EXAM: MRI HEAD WITHOUT CONTRAST TECHNIQUE: Multiplanar, multiecho pulse sequences of the brain and surrounding structures were obtained without intravenous contrast. COMPARISON:  None. FINDINGS: Brain: No acute infarct, mass effect or extra-axial collection. No acute or chronic hemorrhage. Normal white matter signal, parenchymal volume and CSF spaces. The midline structures are normal. Vascular: Major flow voids are preserved. Skull and upper cervical spine: Normal calvarium and skull base. Visualized upper cervical spine and soft tissues are normal. Sinuses/Orbits:No paranasal sinus fluid levels or advanced mucosal thickening. No mastoid or middle ear effusion. Normal orbits. IMPRESSION: Normal brain MRI. Electronically Signed   By: Ulyses Jarred M.D.   On: 02/11/2021 22:34   US RENAL  Result Date: 02/12/2021 CLINICAL DATA:  Acute on chronic renal disease. EXAM: RENAL / URINARY TRACT ULTRASOUND COMPLETE COMPARISON:  None. FINDINGS: Right Kidney: Renal measurements: 11.3 x 5.6 x 5.2 cm = volume: 171 mL. Renal cortical thinning. Echogenicity is increased. Couple of simple renal cysts measuring up to 2.6 cm are noted. No hydronephrosis visualized. Left Kidney: Renal measurements: 10 x 4.7 x 5.7 cm = volume: 139 mL. Echogenicity is increased. There is a multiseptated and lobulated poorly visualized 5.1 x 3.6 x 3.1 cm cystic lesion which could represent several separate cysts. Question associated mural nodularity. No hydronephrosis visualized. Urinary bladder: Appears normal for degree of bladder distention. Other: None. IMPRESSION: 1. Multiseptated and lobulated poorly visualized 5.1 x 3.6 x 3.1 cm left renal cystic lesion which could represent several separate cysts some of which are complex in appearance. Comparison with prior cross-sectional imaging would be of value. If not available, recommend nonemergent MRI with renal protocol further evaluation. 2. Increased renal parenchymal  echogenicity suggestive of renal parenchymal disease. Electronically Signed   By: Iven Finn M.D.   On: 02/12/2021 02:04   MR FOOT RIGHT WO CONTRAST  Result Date: 02/12/2021 CLINICAL DATA:  Osteomyelitis, foot EXAM: MRI OF THE RIGHT FOREFOOT WITHOUT CONTRAST TECHNIQUE: Multiplanar, multisequence MR imaging of the right forefoot was performed. No intravenous contrast was administered. COMPARISON:  Right foot radiograph 02/11/2021 FINDINGS: Motion degraded exam. Bones/Joint/Cartilage Prior amputation of the third toe. There is increased signal within the great toe distal phalanx with likely mild low T1 signal, difficult to fully evaluate due to motion artifact. 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 Ligaments Poorly evaluated due to motion artifact. Ossification along the Lisfranc ligament.  Muscles and Tendons Intramuscular edema in the foot commonly seen in diabetes. Mild muscle atrophy. Soft tissues Mild generalized soft tissue swelling. Likely ulcer adjacent to the great toe distal phalanx as seen on recent radiograph. 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 Electronically Signed   By: Maurine Simmering   On: 02/12/2021 15:47   DG Foot 2 Views Right  Result Date: 02/11/2021 CLINICAL DATA:  Dorsal right foot ulcer. EXAM: RIGHT FOOT - 2 VIEW COMPARISON:  None. FINDINGS: The second, fourth and fifth right toes are flexed in position and subsequently limited in evaluation. The phalanges of the third right toe are likely surgically absent. A small cortical defect is seen along the medial aspect of the base of the distal phalanx of the right great toe. An adjacent 7 mm x 3 mm right great toe soft tissue ulceration is noted. Benign-appearing periarticular lucent areas are seen involving the right great toe, with chronic deformities involving the base of the first right metatarsal and distal aspect of the  second right metatarsal. A small to moderate sized plantar calcaneal spur is noted with marked severity degenerative changes seen within the mid right foot. IMPRESSION: 1. Right great toe ulceration with a small area of suspected acute osteomyelitis. MRI correlation is recommended. 2. Chronic, postoperative and degenerative changes throughout the right foot, as described above. Electronically Signed   By: Virgina Norfolk M.D.   On: 02/11/2021 22:20   EEG adult  Result Date: 02/12/2021 Lora Havens, MD     02/12/2021  9:41 AM Patient Name: Alicianna Connelley MRN: [id number] Epilepsy Attending: Lora Havens Referring Physician/Provider: Dr Alexandria Lodge Date: 02/12/2021 Duration: 25.39 mins Patient history: 75 year old female with altered mental status and diffuse myoclonus.  EEG to evaluate for seizures. Level of alertness: Awake AEDs during EEG study: None Technical aspects: This EEG study was done with scalp electrodes positioned according to the 10-20 International system of electrode placement. Electrical activity was acquired at a sampling rate of '500Hz'$  and reviewed with a high frequency filter of '70Hz'$  and a low frequency filter of '1Hz'$ . EEG data were recorded continuously and digitally stored. Description: No clear posterior dominant rhythm was seen.  EEG showed continuous generalized polymorphic 3 to 5 Hz theta-delta slowing.  At times patient was noted to have intermittent nonrhythmic twitching of right upper extremity. Concomitant EEG before, during and after the event did not show any EEG to suggest seizure.  Hyperventilation and photic stimulation were not performed.   ABNORMALITY - Continuous slow, generalized IMPRESSION: This study is suggestive of moderate diffuse encephalopathy, nonspecific etiology. No seizures or epileptiform discharges were seen throughout the recording. Patient was noted to have intermittent right upper extremity twitching without concomitant EEG change.  This was NOT an  epileptic event. Priyanka O Yadav   VAS [place] ABI WITH/WO TBI  Result Date: 02/12/2021  LOWER EXTREMITY DOPPLER STUDY Patient Name:  ANALISE DOMIN  Date of Exam:   02/12/2021 Medical Rec #: [id number]         Accession #:    [id number] Date of Birth: [birth year]         Patient Gender: F Patient Age:   21Y Exam Location:  [facility] Procedure:      VAS [place] ABI WITH/WO TBI Referring Phys: [place] --------------------------------------------------------------------------------  Indications: Ulceration, and Right lower extremity cellulitis. High Risk Factors: Hypertension, Diabetes, prior CVA. Other Factors: Stage IV CKD.  Limitations: Today's exam  was limited due to patient positioning and Confusion,              constant movement, would not turn supine. Comparison Study: No prior study Performing Technologist: Sharion Dove RVS  Examination Guidelines: A complete evaluation includes at minimum, Doppler waveform signals and systolic blood pressure reading at the level of bilateral brachial, anterior tibial, and posterior tibial arteries, when vessel segments are accessible. Bilateral testing is considered an integral part of a complete examination. Photoelectric Plethysmograph (PPG) waveforms and toe systolic pressure readings are included as required and additional duplex testing as needed. Limited examinations for reoccurring indications may be performed as noted.  ABI Findings: +---------+------------------+-----+----------+-----------------+ Right    Rt Pressure (mmHg)IndexWaveform  Comment           +---------+------------------+-----+----------+-----------------+ PTA      146               1.03 monophasic                  +---------+------------------+-----+----------+-----------------+ DP       145               1.02 monophasic                  +---------+------------------+-----+----------+-----------------+ Great Toe                                 constant  movement +---------+------------------+-----+----------+-----------------+ +---------+------------------+-----+----------+--------------------------------+ Left     Lt Pressure (mmHg)IndexWaveform  Comment                          +---------+------------------+-----+----------+--------------------------------+ Brachial 142                                                               +---------+------------------+-----+----------+--------------------------------+ PTA                                       not insonated secondary to                                                 patient's AMS and positioning    +---------+------------------+-----+----------+--------------------------------+ DP       255               1.80 monophasic                                 +---------+------------------+-----+----------+--------------------------------+ Great Toe                                 constant movement                +---------+------------------+-----+----------+--------------------------------+ +-------+-----------+-----------+------------+------------+ ABI/TBIToday's ABIToday's TBIPrevious ABIPrevious TBI +-------+-----------+-----------+------------+------------+ Right  1.03                                           +-------+-----------+-----------+------------+------------+  Left   1.8                                            +-------+-----------+-----------+------------+------------+  Summary: Right: Resting right ankle-brachial index is within normal range. No evidence of significant right lower extremity arterial disease. ABIs are unreliable. Left: Resting left ankle-brachial index indicates noncompressible left lower extremity arteries. ABIs are unreliable.  *See table(s) above for measurements and observations.  Electronically signed by Monica Martinez MD on 02/12/2021 at 3:29:37 PM.    Final    VAS [place] LOWER EXTREMITY VENOUS (DVT)  Result Date:  02/12/2021  Lower Venous DVT Study Patient Name:  DALAYZA FINNELL  Date of Exam:   02/12/2021 Medical Rec #: [id number]         Accession #:    [id number] Date of Birth: [birth year]         Patient Gender: F Patient Age:   11Y Exam Location:  [facility] Procedure:      VAS [place] LOWER EXTREMITY VENOUS (DVT) Referring Phys: [id number] ELIZABETH REES --------------------------------------------------------------------------------  Indications: Pitting edema, cellulitis.  Limitations: Altered mental status. Positioning-patient refused to turn from right side to supine. Comparison Study: No prior study on file Performing Technologist: Sharion Dove RVS  Examination Guidelines: A complete evaluation includes B-mode imaging, spectral Doppler, color Doppler, and power Doppler as needed of all accessible portions of each vessel. Bilateral testing is considered an integral part of a complete examination. Limited examinations for reoccurring indications may be performed as noted. The reflux portion of the exam is performed with the patient in reverse Trendelenburg.  +---------+---------------+---------+-----------+----------+--------------+ RIGHT    CompressibilityPhasicitySpontaneityPropertiesThrombus Aging +---------+---------------+---------+-----------+----------+--------------+ CFV      Full                                         pulsatile      +---------+---------------+---------+-----------+----------+--------------+ SFJ      Full                                                        +---------+---------------+---------+-----------+----------+--------------+ FV Prox  Full                                         pulsatile      +---------+---------------+---------+-----------+----------+--------------+ FV Mid   Full                                                        +---------+---------------+---------+-----------+----------+--------------+ FV DistalFull                                          pulsatile      +---------+---------------+---------+-----------+----------+--------------+ PFV  pulsatile      +---------+---------------+---------+-----------+----------+--------------+ POP      Full                                         pulsatile      +---------+---------------+---------+-----------+----------+--------------+ PTV      Full                                                        +---------+---------------+---------+-----------+----------+--------------+ PERO     Full                                                        +---------+---------------+---------+-----------+----------+--------------+   Left Technical Findings: Left leg not evaluated. Patient would not turn.   Summary: RIGHT: - No evidence of deep vein thrombosis in the lower extremity. No indirect evidence of obstruction proximal to the inguinal ligament. Pulsatile flow noted throughout, consistent with fluid overload   *See table(s) above for measurements and observations. Electronically signed by Monica Martinez MD on 02/12/2021 at 3:30:57 PM.    Final     Discharge Instructions: Ms. Ragucci,   You were admitted to the hospital due to altered mental status / confusion, thought to be due to a multitude of factors including acute kidney injury, too much acid in your blood, infection of your right toe, and possibly a buildup of opiates (pain medicines) in your system. You were found to have osteomyelitis, a severe infection of the bone in your right great toe, and were evaluated by orthopedic surgery who recommended continued antibiotics and outpatient follow up for amputation.   I have scheduled you a follow up appointment with Dr. Sharol Given (orthopedic surgeon) 02/20/21 at 1:15pm and with Dr. Einar Crow with [facility] Nephrology at [facility] 03/07/21 - these were the earliest available appointments, although please call to reschedule if you  cannot make these appointments. Please also schedule a follow up appointment with your primary care doctor within the next 1-2 weeks so that they can refer you to urology (you were found to have a complex cyst in your kidney needing further imaging workup) and for follow up on your lab results.  Upon discharge from the hospital, please: - START taking Augmentin 1 tablet in the morning and 1 tablet in the evenings; Dr. Sharol Given should inform you how long to take this / provide refills - START taking tylenol for pain '650mg'$  up to every 6 hours for pain  - DECREASE your Januvia dose to 0.5 tablet daily  - DECREASE your dose of atenolol to 0.5 tablets daily  - DECREASE the frequency in which you take Lasix - weight yourself daily and only take if up 2 lbs in one day or 5 lbs in one week or have worsening leg swelling or shortness of breath, especially while laying down.   - STOP taking Synthroid, Glipizide, Lisinopril, Oxycodone and Morphine as your thyroid hormones were borderline high, your blood glucoses were on the low end, and the last 3 meds can injure your kidneys.   Please call your primary care doctor or  return to the ED if you notice you become increasingly confused, have less urine output, pain with urination, worsening jerking movements, fevers, chills, spread of your toe infection, or any other concerns.   It was a pleasure caring for you,   Dr. Konrad Penta   Discharge Instructions    ([place]) Call MD:  Anytime you have any of the following symptoms: 1) 3 pound weight gain in 24 hours or 5 pounds in 1 week 2) shortness of breath, with or without a dry hacking cough 3) swelling in the hands, feet or stomach 4) if you have to sleep on extra pillows at night in order to breathe.   Complete by: As directed    Call MD for:  difficulty breathing, headache or visual disturbances   Complete by: As directed    Call MD for:  extreme fatigue   Complete by: As directed    Call MD for:   persistant dizziness or light-headedness   Complete by: As directed    Call MD for:  persistant nausea and vomiting   Complete by: As directed    Call MD for:  severe uncontrolled pain   Complete by: As directed    Call MD for:  temperature >100.4   Complete by: As directed    Diet - low sodium heart healthy   Complete by: As directed    Diet Carb Modified   Complete by: As directed    Discharge instructions   Complete by: As directed    Increase activity slowly   Complete by: As directed       Signed: Jeralyn Bennett, MD 02/19/2021, 10:31 PM   Pager: [phone]

## 2021-02-14 NOTE — Progress Notes (Signed)
[facility] Progress Note  Name: Molly Arroyo MRN: [id number] DOB: [birth year]  Chief Complaint:  Altered mental status  Subjective:  had 1.2 liters UOP over 5/24 charted.  Per care everywhere follows with [facility] nephrology Shariff, Sherald Hess, MD.  She states she missed most recent appt b/c couldn't get a ride. Saw in 01/2020.   Review of systems:   Denies shortness of breath or chest pain Reports nausea; not vomiting.  States has nausea in the mornings ------------------ Background on consult:  Molly Arroyo is a 75 y.o. female with a history of DM, HTN, CKD, and thyroid disease who presented to the hospital with altered mental status.  Creatinine found to be 3.23 today.  Note she was also found to have purulent drainage from diabetic foot ulcer.  Nephrology is consulted for assistance with management of AKI.  She previously experienced AKI during an episode of  Cellulitis.  She was admitted to [facility] 11/2020 and there peak Cr was 4.65 on 11/23/20 and her baseline before and after event appeared to be around 2.  Home meds include lisinopril, MS contin, oxycodone, and ropinirole.  She had 200 mL uop over 5/22.  Renal US obtained today with no hydro and increased echogenicity; possible septated cyst noted left kidney with question associated mural nodularity.  She was also noted this hospitalization to have myoclonus.  She tells me that she came to the hospital because her daughter thought she had a stroke.  Mental status seems to be improving.  She denies any difficulty with urination.  Per nursing purewick hasn't worked well - has leaked and she ends up with urine in the bed.    Intake/Output Summary (Last 24 hours) at 02/14/2021 0828 Last data filed at 02/14/2021 [id number] Gross per 24 hour  Intake 2920.7 ml  Output 1150 ml  Net 1770.7 ml    Vitals:  Vitals:   02/13/21 2013 02/14/21 0018 02/14/21 0437 02/14/21 0752  BP: (!) 156/72 (!) 148/80 (!) 158/63 (!) 143/66  Pulse: 71  72 70 68  Resp: '18 16 14 '$ (!) 21  Temp: 97.8 F (36.6 C) 98.7 F (37.1 C) 98.6 F (37 C) 98 F (36.7 C)  TempSrc: Oral Oral Oral Oral  SpO2: 94% 91% 93% 91%  Weight:   60.2 kg   Height:         Physical Exam:   General elderly female in bed in no acute distress HEENT normocephalic atraumatic extraocular movements intact sclera anicteric Neck supple trachea midline Lungs clear to auscultation bilaterally normal work of breathing at rest on room air with sats 94-98% on my exam variable waveforms Heart S1S2 no rub Abdomen soft nontender nondistended Extremities no edema appreciated Neuro - awake on arrival; alert and oriented x 3.  Myoclonus better   Medications reviewed   Labs:  BMP Latest Ref Rng & Units 02/14/2021 02/13/2021 02/12/2021  Glucose 70 - 99 mg/dL 81 79 121(H)  BUN 8 - 23 mg/dL 39(H) 43(H) 51(H)  Creatinine 0.44 - 1.00 mg/dL 2.58(H) 2.70(H) 3.23(H)  Sodium 135 - 145 mmol/L 138 142 138  Potassium 3.5 - 5.1 mmol/L 4.1 4.2 5.4(H)  Chloride 98 - 111 mmol/L 107 113(H) 115(H)  CO2 22 - 32 mmol/L 19(L) 16(L) 14(L)  Calcium 8.9 - 10.3 mg/dL 7.8(L) 8.0(L) 8.1(L)     Assessment/Plan:   # AKI   - Pre-renal and ischemic insults in setting of infection.  Also with recent AKI episode two months ago.  UA with RBCs in setting  of UTI.  Note up/cr ratio 7340 mg/g. Complement normal and ANA neg - Improving with supportive care.  - Stop bicarb gtt - ANCA in process given the hematuria (and weight loss) but this may be due to UTI as she is improving with supportive care - would avoid morphine for pain given her renal failure  - For future planning, please do not resume lisinopril (or morphine) on discharge.  Can reassess at renal follow-up.  Ok for lasix as needed dosing on discharge  # CKD stage IV - Felt DM and HTN  - Baseline Cr 2 recently - Follows with nephrology at [facility] - she will need to follow-up with [facility] after discharge  # proteinuria  - nephrotic range.  She does  have known proteinuria felt 2/2 DM. Last seen 01/2020 by nephrology in clinic from what I can see.  - other pending studies: SPEP, UPEP, and free light chains in addition to above.  Note code for UPEP - random UPEP (random urine) - code [phone] listed as misc send out   # Metabolic acidosis   - Stop with bicarb gtt   # Hyperkalemia - resolved/temporized with bicarb  # Encephalopathy  - metabolic in part with acidosis and AKI; note also on morphine and oxycodone.  Also with UTI.  Concurrent myoclonus - would avoid morphine for pain given her renal failure  - s/p neuro eval - appears to be improving  # Diabetic foot ulcer - right foot  - abx per primary team   # Anemia - normocytic  - iron stores ok - Ruling out plasma cell dyscrasia as above  # HTN  - Controlled; avoid hypotension   # Multi-septated renal cyst  - would recommend outpatient follow-up with urology   # Hyperphosphatemia  - improving with improving renal failure; would recommend renal diet when taking PO  # Weight loss  - work-up per primary team   Claudia Desanctis, MD 02/14/2021 8:44 AM

## 2021-02-14 NOTE — Progress Notes (Signed)
Pt requests med for sleep. MD paged.

## 2021-02-15 LAB — RENAL FUNCTION PANEL
Albumin: 2.4 g/dL — ABNORMAL LOW (ref 3.5–5.0)
Anion gap: 7 (ref 5–15)
BUN: 26 mg/dL — ABNORMAL HIGH (ref 8–23)
CO2: 23 mmol/L (ref 22–32)
Calcium: 7.7 mg/dL — ABNORMAL LOW (ref 8.9–10.3)
Chloride: 105 mmol/L (ref 98–111)
Creatinine, Ser: 2.15 mg/dL — ABNORMAL HIGH (ref 0.44–1.00)
GFR, Estimated: 23 mL/min — ABNORMAL LOW (ref >60)
Glucose, Bld: 108 mg/dL — ABNORMAL HIGH (ref 70–99)
Phosphorus: 3.1 mg/dL (ref 2.5–4.6)
Potassium: 3.4 mmol/L — ABNORMAL LOW (ref 3.5–5.1)
Sodium: 135 mmol/L (ref 135–145)

## 2021-02-15 LAB — CBC
HCT: 25.2 % — ABNORMAL LOW (ref 36.0–46.0)
Hemoglobin: 8.3 g/dL — ABNORMAL LOW (ref 12.0–15.0)
MCH: 29.9 pg (ref 26.0–34.0)
MCHC: 32.9 g/dL (ref 30.0–36.0)
MCV: 90.6 fL (ref 80.0–100.0)
Platelets: 227 10*3/uL (ref 150–400)
RBC: 2.78 MIL/uL — ABNORMAL LOW (ref 3.87–5.11)
RDW: 13.9 % (ref 11.5–15.5)
WBC: 10 10*3/uL (ref 4.0–10.5)
nRBC: 0 % (ref 0.0–0.2)

## 2021-02-15 LAB — PROTEIN ELECTROPHORESIS, SERUM
A/G Ratio: 1 (ref 0.7–1.7)
Albumin ELP: 3 g/dL (ref 2.9–4.4)
Alpha-1-Globulin: 0.2 g/dL (ref 0.0–0.4)
Alpha-2-Globulin: 0.9 g/dL (ref 0.4–1.0)
Beta Globulin: 0.9 g/dL (ref 0.7–1.3)
Gamma Globulin: 0.9 g/dL (ref 0.4–1.8)
Globulin, Total: 2.9 g/dL (ref 2.2–3.9)
Total Protein ELP: 5.9 g/dL — ABNORMAL LOW (ref 6.0–8.5)

## 2021-02-15 LAB — GLUCOSE, CAPILLARY
Glucose-Capillary: 111 mg/dL — ABNORMAL HIGH (ref 70–99)
Glucose-Capillary: 148 mg/dL — ABNORMAL HIGH (ref 70–99)

## 2021-02-15 MED ORDER — HYDROMORPHONE HCL 4 MG PO TABS: 8.0000 mg | ORAL_TABLET | Freq: Two times a day (BID) | ORAL | 0 refills | Status: AC | PRN

## 2021-02-15 MED ORDER — AMOXICILLIN-POT CLAVULANATE 500-125 MG PO TABS: 1.0000 | ORAL_TABLET | Freq: Two times a day (BID) | ORAL | 0 refills | Status: DC

## 2021-02-15 MED ORDER — POTASSIUM CHLORIDE 20 MEQ PO PACK
20.0000 meq | PACK | Freq: Once | ORAL | Status: AC
  Administered 2021-02-15: 20 meq via ORAL
  Filled 2021-02-15: qty 1

## 2021-02-15 MED ORDER — ATENOLOL 50 MG PO TABS
50.0000 mg | ORAL_TABLET | Freq: Every day | ORAL | Status: DC
  Administered 2021-02-15: 50 mg via ORAL
  Filled 2021-02-15: qty 1

## 2021-02-15 MED ORDER — ATENOLOL 50 MG PO TABS: 50.0000 mg | ORAL_TABLET | Freq: Every day | ORAL | 0 refills | Status: DC

## 2021-02-15 NOTE — Discharge Instructions (Signed)
[name],   You were admitted to the hospital due to altered mental status / confusion, thought to be due to a multitude of factors including acute kidney injury, too much acid in your blood, infection of your right toe, and possibly a buildup of opiates (pain medicines) in your system. You were found to have osteomyelitis, a severe infection of the bone in your right great toe, and were evaluated by orthopedic surgery who recommended continued antibiotics and outpatient follow up for amputation.   I have scheduled you a follow up appointment with Dr. Sharol Given (orthopedic surgeon) 02/20/21 at 1:15pm and with Dr. Einar Crow with [facility]phrology at [facility] 03/07/21 - these were the earliest available appointments, although please call to reschedule if you cannot make these appointments. Please also schedule a follow up appointment with your primary care doctor in about 1 week and either call urology (number provided) in [facility] or seek referral by your primary care provider to see another urologist as you were found to have a complex cyst in your kidney needing further imaging workup. You will also require repeat lab work at your follow up appointment.   Upon discharge from the hospital, please: - START taking Augmentin 1 tablet in the morning and 1 tablet in the evenings; Dr. Sharol Given should inform you how long to take this / provide refills - START taking tylenol for pain '650mg'$  up to every 6 hours for pain  - DECREASE your Januvia dose to 0.5 tablet daily  - DECREASE the frequency in which you take Lasix - weight yourself daily and only take if up 2 lbs in one day or 5 lbs in one week or have worsening leg swelling or shortness of breath, especially while laying down.  - CONTINUE taking amlodipine and atenolol 1 tablet daily as well as your other medications EXCEPT: - STOP taking Synthroid, Glipizide, Lisinopril, Oxycodone and Morphine as your thyroid hormones were borderline high, your blood glucoses were  on the low end, and the last 3 meds can injure your kidneys.   I have prescribed you a short (7 day) course of dilaudid for your chronic pain. Please take 1-2 tablets in the morning and 1-2 tablets in the evening as needed for severe pain that is not controlled by Tylenol '650mg'$  every 6 hours. You may increase to up to 2.5 pills twice daily if needed but please refrain from taking any higher doses as this can lead to confusion, fatigue, and troubles breathing.  Please call your primary care doctor or return to the ED if you notice you become increasingly confused, have less urine output, pain with urination, other severe pain, worsening jerking movements, fevers, chills, spread of your toe infection, or any other concerns.   It was a pleasure caring for you,   Dr. Konrad Penta     Acute Kidney Injury, Adult  Acute kidney injury is a sudden worsening of kidney function. The kidneys are organs that have several jobs. They filter the blood to remove waste products and extra fluid. They also maintain a healthy balance of minerals and hormones in the body, which helps control blood pressure and keep bones strong. With this condition, your kidneys do not do their jobs as well as they should. This condition ranges from mild to severe. Over time, it may develop into long-lasting (chronic) kidney disease. Early detection and treatment may prevent acute kidney injury from developing into a chronic condition. What are the causes? Common causes of this condition include:  A  problem with blood flow to the kidneys. This may be caused by: ? Low blood pressure (hypotension) or shock. ? Blood loss. ? Heart and blood vessel (cardiovascular) disease. ? Severe burns. ? Liver disease.  Direct damage to the kidneys. This may be caused by: ? Certain medicines. ? A kidney infection. ? Poisoning. ? Being around or in contact with toxic substances. ? A surgical wound. ? A hard, direct hit to the kidney area.  A  sudden blockage of urine flow. This may be caused by: ? Cancer. ? Kidney stones. ? An enlarged prostate in males. What increases the risk? You are more likely to develop this condition if you:  Are older than age 67.  Are female.  Are hospitalized, especially if you are in critical condition.  Have certain conditions, such as: ? Chronic kidney disease. ? Diabetes. ? Coronary artery disease and heart failure. ? Pulmonary disease. ? Chronic liver disease. What are the signs or symptoms? Symptoms of this condition may not be obvious until the condition becomes severe. Symptoms of this condition can include:  Tiredness (lethargy) or difficulty staying awake.  Nausea or vomiting.  Swelling (edema) of the face, legs, ankles, or feet.  Problems with urination, such as: ? Pain in the abdomen, or pain along the side of your stomach (flank). ? Producing little or no urine. ? Passing urine with a weak flow.  Muscle twitches and cramps, especially in the legs.  Confusion or trouble concentrating.  Loss of appetite.  Fever. How is this diagnosed? Your health care provider can diagnose this condition based on your symptoms, medical history, and a physical exam.  You may also have other tests, such as:  Blood tests.  Urine tests.  Imaging tests.  A test in which a sample of tissue is removed from the kidneys to be examined under a microscope (kidney biopsy). How is this treated? Treatment for this condition depends on the cause and how severe the condition is. In mild cases, treatment may not be needed. The kidneys may heal on their own. In more severe cases, treatment will involve:  Treating the cause of the kidney injury. This may involve changing any medicines you are taking or adjusting your dosage.  Fluids. You may need specialized IV fluids to balance your body's needs.  Having a catheter placed to drain urine and prevent blockages.  Preventing problems from  occurring. This may mean avoiding certain medicines or procedures that can cause further injury to the kidneys. In some cases, treatment may also require:  A procedure to remove toxic wastes from the body (dialysis or continuous renal replacement therapy, CRRT).  Surgery. This may be done to repair a torn kidney or to remove the blockage from the urinary system. Follow these instructions at home: Medicines  Take over-the-counter and prescription medicines only as told by your health care provider.  Do not take any new medicines without your health care provider's approval. Many medicines can worsen your kidney damage.  Do not take any vitamin and mineral supplements without your health care provider's approval. Many nutritional supplements can worsen your kidney damage. Lifestyle  If your health care provider prescribed changes to your diet, follow them. You may need to decrease the amount of protein you eat.  Achieve and maintain a healthy weight. If you need help with this, ask your health care provider.  Start or continue an exercise plan. Try to exercise at least 30 minutes a day, 5 days a week.  Do  not use any products that contain nicotine or tobacco, such as cigarettes, e-cigarettes, and chewing tobacco. If you need help quitting, ask your health care provider.   General instructions  Keep track of your blood pressure. Report changes in your blood pressure as told by your health care provider.  Stay up to date with your vaccines. Ask your health care provider which vaccines you need.  Keep all follow-up visits as told by your health care provider. This is important.   Where to find more information  American Association of Kidney Patients: BombTimer.gl  National Kidney Foundation: www.kidney.[place]: https://mathis.com/  [employer] [facility]: ? www.lifeoptions.org ? www.kidneyschool.org Contact a health care provider if:  Your symptoms get  worse.  You develop new symptoms. Get help right away if:  You develop symptoms of worsening kidney disease, which include: ? Headaches. ? Abnormally dark or light skin. ? Easy bruising. ? Frequent hiccups. ? Chest pain. ? Shortness of breath. ? End of menstruation in women. ? Seizures. ? Confusion or altered mental status. ? Abdominal or back pain. ? Itchiness.  You have a fever.  Your body is producing less urine.  You have pain or bleeding when you urinate. Summary  Acute kidney injury is a sudden worsening of kidney function.  Acute kidney injury can be caused by problems with blood flow to the kidneys, direct damage to the kidneys, and sudden blockage of urine flow.  Symptoms of this condition may not be obvious until it becomes severe. Symptoms may include edema, lethargy, confusion, nausea or vomiting, and problems passing urine.  This condition can be diagnosed with blood tests, urine tests, and imaging tests. Sometimes a kidney biopsy is done to diagnose this condition.  Treatment for this condition often involves treating the underlying cause. It is treated with fluids, medicines, diet changes, dialysis, or surgery. This information is not intended to replace advice given to you by your health care provider. Make sure you discuss any questions you have with your health care provider. Document Revised: 07/20/2019 Document Reviewed: 07/20/2019 Elsevier Patient Education  2021 [employer].

## 2021-02-15 NOTE — Plan of Care (Signed)

## 2021-02-15 NOTE — Progress Notes (Signed)
[facility] Progress Note  Name: Molly Arroyo MRN: [id number] DOB: [birth year]  Chief Complaint:  Altered mental status  Subjective:  had 1.1 liters UOP over 5/25 charted.  Feels ok.  She asks about plan for her foot and I asked her to please speak with team. Feels better  Review of systems:   Denies shortness of breath or chest pain No nausea this AM which is an improvement.  states has previously nausea in the mornings ------------------ Background on consult:  Molly Arroyo is a 75 y.o. female with a history of DM, HTN, CKD, and thyroid disease who presented to the hospital with altered mental status.  Creatinine found to be 3.23 today.  Note she was also found to have purulent drainage from diabetic foot ulcer.  Nephrology is consulted for assistance with management of AKI.  She previously experienced AKI during an episode of  Cellulitis.  She was admitted to [facility] 11/2020 and there peak Cr was 4.65 on 11/23/20 and her baseline before and after event appeared to be around 2.  Home meds include lisinopril, MS contin, oxycodone, and ropinirole.  She had 200 mL uop over 5/22.  Renal US obtained today with no hydro and increased echogenicity; possible septated cyst noted left kidney with question associated mural nodularity.  She was also noted this hospitalization to have myoclonus.  She tells me that she came to the hospital because her daughter thought she had a stroke.  Mental status seems to be improving.  She denies any difficulty with urination.  Per nursing purewick hasn't worked well - has leaked and she ends up with urine in the bed.    Intake/Output Summary (Last 24 hours) at 02/15/2021 0807 Last data filed at 02/15/2021 0342 Gross per 24 hour  Intake 440.8 ml  Output 1100 ml  Net -659.2 ml    Vitals:  Vitals:   02/14/21 2100 02/14/21 2339 02/15/21 0150 02/15/21 0339  BP:  (!) 142/58  (!) 180/78  Pulse:      Resp: 17 18    Temp:  98.4 F (36.9 C)  97.9 F  (36.6 C)  TempSrc:  Oral  Oral  SpO2:  91%  96%  Weight:   60.2 kg   Height:         Physical Exam:   General elderly female in bed in no acute distress HEENT normocephalic atraumatic extraocular movements intact sclera anicteric Neck supple trachea midline Lungs clear to auscultation bilaterally normal work of breathing at rest on room air Heart S1S2 no rub Abdomen soft nontender nondistended Extremities no edema appreciated Neuro - awake on arrival; alert and oriented x 3.  Myoclonus better   Medications reviewed   Labs:  BMP Latest Ref Rng & Units 02/15/2021 02/14/2021 02/13/2021  Glucose 70 - 99 mg/dL 108(H) 81 79  BUN 8 - 23 mg/dL 26(H) 39(H) 43(H)  Creatinine 0.44 - 1.00 mg/dL 2.15(H) 2.58(H) 2.70(H)  Sodium 135 - 145 mmol/L 135 138 142  Potassium 3.5 - 5.1 mmol/L 3.4(L) 4.1 4.2  Chloride 98 - 111 mmol/L 105 107 113(H)  CO2 22 - 32 mmol/L 23 19(L) 16(L)  Calcium 8.9 - 10.3 mg/dL 7.7(L) 7.8(L) 8.0(L)     Assessment/Plan:   # AKI   - Pre-renal and ischemic insults in setting of infection.  Also with recent AKI episode two months ago.  UA with RBCs in setting of UTI.  Note up/cr ratio 7340 mg/g. Complement normal and ANA neg. ANCA negative.  - Improving with  supportive care.  - would avoid morphine for pain given her renal failure  - For future planning, please do not resume lisinopril (or morphine) on discharge.  Can reassess at renal follow-up.  Ok for lasix as needed dosing on discharge  # CKD stage IV - Felt DM and HTN  - Baseline Cr 2 recently - Follows with nephrology at [facility] - she will need to follow-up with [facility] after discharge  # proteinuria  - nephrotic range.  She does have known proteinuria felt 2/2 DM. Last seen 01/2020 by nephrology in clinic from what I can see. Free light chains ok at 1.88 (expected for CKD) - other pending studies: SPEP, UPEP.  Note code for UPEP - random UPEP (random urine) - code [phone] listed as misc send out   # Metabolic  acidosis   - improved   # Hyperkalemia - resolved/temporized with bicarb  # Encephalopathy  - metabolic in part with acidosis and AKI; note also on morphine and oxycodone.  Also with UTI.  Concurrent myoclonus - would avoid morphine for pain given her renal failure  - s/p neuro eval - improved   # Diabetic foot ulcer - right foot  - abx per primary team   # Anemia - normocytic  - iron stores ok  - Ruling out plasma cell dyscrasia as above  # HTN  - would defer lisinopril until renal follow-up.  avoid hypotension  - increase atenolol to 50 mg daily   # Multi-septated renal cyst  - would recommend outpatient follow-up with urology   # Hyperphosphatemia  - resolved with improving renal failure  # Weight loss  - work-up per primary team   Nephrology will sign off.  Patient should follow-up with her nephrologist at [facility], Sherald Hess, MD.  She should follow-up with her PCP within a week of discharge.   Claudia Desanctis, MD 02/15/2021 8:18 AM

## 2021-02-15 NOTE — Progress Notes (Signed)
Pt has found her money and cards within a pocket of her clothing.

## 2021-02-15 NOTE — Care Management Important Message (Signed)
Important Message  Patient Details  Name: Molly Arroyo MRN: [id number] Date of Birth: [birth year]   Medicare Important Message Given:  Yes     Shelda Altes 02/15/2021, 9:41 AM

## 2021-02-15 NOTE — Progress Notes (Signed)
D/C instructions given and reviewed. Tele and IV removed, tolerated well. [facility] transport to be called for transport home.

## 2021-02-15 NOTE — Progress Notes (Signed)
Subjective:   No acute events were reported overnight.   This morning, [name] asks when she will be going home and feels ready for discharge. She says she will be able to go home to her best friend [name] (notably in her 30's and blind) and is excited to return to where she presented from. She says she is fully independent although does rely on pain medications (says 30 Morphine and 10 Oxy BID) for chronic back pain so that she can be more mobile. She wonders whether she may have a stomach virus as she has new diarrhea (3-4 brown BM daily) over the past couple of days, although her nausea and vomiting have improved with Zofran and are chronic and she denies abdominal pain, fevers, or chills. Denies SOB, CP, worsening LE symptoms, confusion, fatigue, or any other symptoms.   Objective:  Vital signs in last 24 hours: Vitals:   02/15/21 0150 02/15/21 0339 02/15/21 0842 02/15/21 1103  BP:  (!) 180/78 (!) 170/70 (!) 153/70  Pulse:   68 60  Resp:   18 20  Temp:  97.9 F (36.6 C) 97.9 F (36.6 C) 98.3 F (36.8 C)  TempSrc:  Oral Oral Oral  SpO2:  96% 98% 100%  Weight: 60.2 kg     Height:       General: Patient is elderly and appears chronically ill although comfortable in no acute distress. Eyes: Sclera non-icteric. No conjunctival injection. EOMI.  HENT: MMM. Poor dentition.  Respiratory: Anterior lung sounds CTA bilaterally. Normal work of breathing on room air.  Cardiovascular: RRR. No m/r/g. There is trace bilateral lower extremity edema, non-pitting. Extremities are warm and well perfused.  Neurological: Awake, alert and oriented x 4. Intact attention and short term memory. No myoclonus or significant tremor. No acute focal neurological deficits noted.  Abdominal: Abdomen is soft, non-tender and not distended. Bowel sounds are intact throughout. No guarding or rebound.  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   Skin: Right toe remains erythematous with a punctate lesion although  without active drainage. Lesion on dorsal right foot is crusted and scabbed. No other lesions noted.  Psych: Normal affect. Normal tone of voice.   Assessment/Plan:  Principal Problem:   Acute encephalopathy Active Problems:   Acute renal failure superimposed on stage 4 chronic kidney disease (HCC)   Type 2 diabetes mellitus with renal complication (HCC)   Microscopic hematuria   Urinary tract infection   Diabetic ulcer of right midfoot associated with type 2 diabetes mellitus, with fat layer exposed ([place])   Diabetic ulcer of right great toe (HCC)   Cellulitis of right lower extremity   Acute metabolic encephalopathy  Molly Arroyo is a 75 y.o. woman with a past medical history significant for type 2 diabetes, hypertension, CKD stage IV, chronic pain on chronic opioids, CVA, mild cognitive impairment, spinal stenosis, HFpEF, hypothyroidism, GERD, R knee replacement, and recent hospitalization 11/23/20-12/02/20 for septic shock secondary to RLE cellulitis complicated by hypoxemic respiratory failure and AKI who presents from home with acute encephalopathy, admitted for further evaluation and management of acute encephalopathy and AKI in the setting of chronic opiate use, right toe osteomyelitis requiring outpatient amputation, and NAGMA that has resolved.   Acute Encephalopathy, Multifactorial, Resolved  Encephalopathy has resolved, likely as her uremia has nearly resolved, NAGMA has resolved, she is not requiring opioids here, and she is on ABX which are controlling her infection.  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 Sharol Given 02/20/21 to discuss amputation of her right great toe due to osteomyelitis. She is currently on Unasyn. WBC has normalized.  - Transitioning to Augmentin '500mg'$  BID on discharge (stop date to be  determined by ortho)  - Blood cultures show no growth x 4 days  AKI on CKD stage IV, Improving  Non-Anion Gap Metabolic Acidosis, Resolved  Hematuria and Nephrotic Range Proteinuria  Hypokalemia Nephrology were consulted due to AKI and it is thought this is likely pre-renal and due to ischemic insults in the setting of her cellulitis and U/A that is consistent with UTI. Her BUN and creatinine continues to improve daily, now close to her baseline (~1.8-2), NAGMA and myoclonus have resolved. Potassium is slightly low now. Her urine protein/Cr ratio is markedly elevated to 7,340. Her free light chains are 1.88 (as expected with CKD). ANA and ANCA are negative. Initial urine culture showed multiple species and recollection showed 70,000 colonies of yeast. She does have a history of candidal UTI although continues to deny urinary symptoms.  - Gave 78mq KCl via packet  - Nephrology have signed off, appreciate their assistance  - [facility] - SPEP and UPEP pending (for nephrotic range proteinuria)  - Will provide contact information for urology follow up for complex renal cyst   Acute on Chronic N/V Acute Diarrhea  Patient notes her morning nausea and vomiting are chronic and have improved with Zofran PRN and every morning as she says she uses this at home. She does note 4 episodes of loose stools daily over the past couple of days although without odor or color change. This is most likely due to ABX while in the hospital although may be due to opioid withdrawal as she is on these daily at home and they were held this admission due to AFestus/ renal fx. - Continue Zofran on discharge  - Consider C. Diff / H. Pylori testing if symptoms are persistent or worsening  HFpEF (G1DD)  Last formal echo 06/2017 showed LVEF 60-65% with grade 1 diastolic dysfunction. Patient previously on Lasix however was held during recent hospitalization and has since not  been resumed.  - Will resume Lasix PRN on discharge    Type 2 diabetes mellitus Last A1c 6.9% on 11/28/20. On invokana, januvia, and glipizide outpatient. Glucose levels continue to be on the lower side.  - Restarting Invokana and low-dose Januvia on discharge  - Will hold glipizide on discharge  - Will require close outpatient follow up    Normocytic anemia, Stable  Hemoglobin improved back to baseline this morning. She denies any symptoms and has no documented CAD.  - Hold transfusion given Hgb >7  HTN  Blood pressures have come down since overnight with up-titration of atenolol to home dose.  - Continue home amlodipine '10mg'$  daily  - Start and continue atenolol '50mg'$  daily at discharge  - Continuing to hold lisinopril until she has nephrology follow up due to AKI on CKD IV  - PRN IV labetalol for SBP >180    Chronic pain on chronic opioids Spinal stenosis On Morphine 30 mg twice daily and oxycodone 5 mg twice daily as needed (although patient states '10mg'$  BID). Per most PCP note in Care Everywhere from 12/29/20, patient's PCP expressed concern that patient may be taking this more frequently than prescribed. - Patient  had done well without opioids in the hospital but notes she needs these for activity at home  - Gave short 7 day course of dilaudid PO   Hypothyroidism, Resolved   Last TSH 0.367 though in setting of acute illness. Repeat thyroid tests during this admission were normal.  - Will hold off on adding back home Synthroid and recommend outpatient follow up  Hx of CVA  - Continue Plavix    Anxiety, GERD, leg cramps/tremor - at baseline   Code Status: Full code  Diet: Renal / CM diet (2L fluid restricted)  DVT PPx: Heparin  Fluids: None   Prior to Admission Living Arrangement: Home Anticipated Discharge Location: Home (to [facility])  Barriers to Discharge: None - stable for discharge this afternoon.   Jeralyn Bennett, MD 02/15/2021, 4:41 PM Pager: [phone] After 5pm on  weekdays and 1pm on weekends: On Call pager [phone]

## 2021-02-15 NOTE — TOC Progression Note (Addendum)
Transition of Care [facility]) - Progression Note    Patient Details  Name: Molly Arroyo MRN: [id number] Date of Birth: Jun 05, 1946  Transition of [facility]) CM/SW Contact  Reece Agar, [place] Phone Number: 02/15/2021, 2:30 PM  Clinical Narrative:    [id number]: [facility] will contact [facility] for DC        Expected Discharge Plan and Services           Expected Discharge Date: 02/15/21                                     Social Determinants of Health (SDOH) Interventions    Readmission Risk Interventions No flowsheet data found.

## 2021-02-15 NOTE — Progress Notes (Signed)
Pt has refused cardiac telemetry, a new IV, Heparin subQ, and IV Unasyn this morning in anticipation of discharge home. Pt's daughter does not wish pt to be discharged back to her living arrangement. PT's daughter also states that she can no longer take care of her mother and hopes for discharge to a facility.

## 2021-02-15 NOTE — Plan of Care (Signed)

## 2021-02-16 LAB — CULTURE, BLOOD (ROUTINE X 2)
Culture: NO GROWTH
Culture: NO GROWTH

## 2021-02-16 LAB — MISC LABCORP TEST (SEND OUT): Labcorp test code: 354928

## 2021-02-20 ENCOUNTER — Ambulatory Visit: Payer: Medicare (Managed Care) | Admitting: Orthopedic Surgery

## 2021-03-01 ENCOUNTER — Ambulatory Visit: Payer: Medicare (Managed Care) | Admitting: Orthopedic Surgery

## 2021-03-15 ENCOUNTER — Other Ambulatory Visit: Payer: Self-pay | Admitting: Student

## 2021-03-30 ENCOUNTER — Ambulatory Visit: Payer: Self-pay | Admitting: *Deleted

## 2021-03-30 NOTE — Telephone Encounter (Signed)
Pt wants to know the result of her MRI.  Looks like she was admitted to the hospital on 02/11/2021 and an MRI of her foot was done.   There is no interpretation of the MRI result.   She mentioned she got a letter saying there was an incidental finding which is why she is calling.   The number on the bottom of the paper was for the Patient [facility] [phone] (which is who she is talking with) however there is no interpretation of the MRI result.    I looked in her chart to see what dr. Kendrick Fries she had because she said she doesn't have a PCP.   She has an appt with a podiatrist coming up.  Also an appt with a family dr on 04/04/2021.    She has an appt with a podiatrist on 04/05/2021 Dr. Marchia Bond.   I let her know he was the dr she needs to talk with regarding the MRI result of her foot.   She hung up.

## 2021-04-07 ENCOUNTER — Other Ambulatory Visit: Payer: Self-pay | Admitting: Student

## 2021-04-14 ENCOUNTER — Other Ambulatory Visit: Payer: Self-pay | Admitting: Student

## 2021-05-10 ENCOUNTER — Other Ambulatory Visit: Payer: Self-pay | Admitting: Student

## 2021-08-27 ENCOUNTER — Other Ambulatory Visit: Payer: Self-pay

## 2021-08-27 DIAGNOSIS — N179 Acute kidney failure, unspecified: Secondary | ICD-10-CM

## 2021-09-03 ENCOUNTER — Encounter: Payer: Medicare (Managed Care) | Admitting: Vascular Surgery

## 2021-09-03 ENCOUNTER — Ambulatory Visit (HOSPITAL_COMMUNITY): Payer: Medicare (Managed Care)

## 2021-09-03 ENCOUNTER — Ambulatory Visit (HOSPITAL_COMMUNITY): Payer: Medicare (Managed Care) | Attending: Vascular Surgery

## 2021-09-19 ENCOUNTER — Encounter: Payer: Self-pay | Admitting: *Deleted

## 2021-10-16 HISTORY — PX: BELOW KNEE LEG AMPUTATION: SUR23

## 2022-02-14 ENCOUNTER — Other Ambulatory Visit: Payer: Self-pay | Admitting: *Deleted

## 2022-02-14 DIAGNOSIS — N179 Acute kidney failure, unspecified: Secondary | ICD-10-CM

## 2022-02-26 ENCOUNTER — Other Ambulatory Visit: Payer: Self-pay

## 2022-02-26 ENCOUNTER — Ambulatory Visit (INDEPENDENT_AMBULATORY_CARE_PROVIDER_SITE_OTHER)
Admission: RE | Admit: 2022-02-26 | Discharge: 2022-02-26 | Disposition: A | Discharge status: Home / Self Care | Payer: Medicare (Managed Care) | Source: Ambulatory Visit | Attending: Vascular Surgery | Admitting: Vascular Surgery

## 2022-02-26 ENCOUNTER — Ambulatory Visit (INDEPENDENT_AMBULATORY_CARE_PROVIDER_SITE_OTHER): Payer: Medicare (Managed Care) | Admitting: Vascular Surgery

## 2022-02-26 ENCOUNTER — Encounter: Payer: Self-pay | Admitting: Vascular Surgery

## 2022-02-26 ENCOUNTER — Ambulatory Visit (HOSPITAL_COMMUNITY)
Admission: RE | Admit: 2022-02-26 | Discharge: 2022-02-26 | Disposition: A | Discharge status: Home / Self Care | Payer: Medicare (Managed Care) | Source: Ambulatory Visit | Attending: Vascular Surgery | Admitting: Vascular Surgery

## 2022-02-26 DIAGNOSIS — N184 Chronic kidney disease, stage 4 (severe): Secondary | ICD-10-CM | POA: Diagnosis not present

## 2022-02-26 DIAGNOSIS — N186 End stage renal disease: Secondary | ICD-10-CM

## 2022-02-26 DIAGNOSIS — N179 Acute kidney failure, unspecified: Secondary | ICD-10-CM | POA: Insufficient documentation

## 2022-02-26 NOTE — Progress Notes (Signed)
Patient name: Molly Arroyo MRN: [id number] DOB: January 05, 1946 Sex: female  REASON FOR CONSULT: Evaluate permanent hemodialysis access evaluation  HPI: Molly Arroyo is a 76 y.o. 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  Patient has no prior HD access in her upper arms in the past.  Currently using a right IJ tunneled catheter.  No other chest wall implants.  States she uses both arms and really her right arm is her dominant arm.  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  Referral from nephrology states place AV fistula or AV graft now.  Past Medical History:  Diagnosis Date   Chronic kidney disease    Diabetes mellitus without complication ([place])    Hypertension    Hypothyroidism    Thyroid disease     PSH: Right BKA  History reviewed. No pertinent family history.  SOCIAL HISTORY: Social History   Socioeconomic History   Marital status: Unknown    Spouse name: Not on file   Number of children: Not on file   Years of education: Not on file   Highest education level: Not on file  Occupational History   Not on file  Tobacco Use   Smoking status: Never   Smokeless tobacco: Not on file  Substance and Sexual Activity   Alcohol use: Never   Drug use: Never   Sexual activity: Not on file  Other Topics Concern   Not on file  Social History Narrative   Not on file   Social Determinants of Health   Financial Resource Strain: Not on file  Food Insecurity: Not on file  Transportation Needs: Not on file  Physical Activity: Not on file  Stress: Not on file  Social Connections: Not on file  Intimate Partner Violence: Not on file    No Known Allergies  Current Outpatient Medications  Medication Sig Dispense Refill   acetaminophen (TYLENOL) 325 MG tablet Take 2 tablets (650 mg total) by mouth every 6 (six) hours as needed for mild pain, moderate pain or headache (or  Fever >/= 101).     amLODipine (NORVASC) 10 MG tablet Take 1 tablet (10 mg total) by mouth daily. 30 tablet 0   atenolol (TENORMIN) 50 MG tablet Take 1 tablet (50 mg total) by mouth daily. 30 tablet 0   clopidogrel (PLAVIX) 75 MG tablet Take 75 mg by mouth daily.     furosemide (LASIX) 20 MG tablet Take 1 tablet (20 mg total) by mouth daily as needed for edema (for weight gain >3 lbs in 1 day of >5lbs in 1 week or worsening lower extremity swelling / shortness of breath laying down). 30 tablet 0   INVOKANA 100 MG TABS tablet Take 1 tablet (100 mg total) by mouth daily. 30 tablet 0   JANUVIA 50 MG tablet Take 0.5 tablets (25 mg total) by mouth daily. 30 tablet 0   ondansetron (ZOFRAN) 4 MG tablet Take 1 tablet (4 mg total) by mouth every 6 (six) hours as needed for nausea or vomiting. 30 tablet 0   rOPINIRole (REQUIP) 1 MG tablet Take 1 mg by mouth at bedtime as needed for other. Leg cramps     amoxicillin-clavulanate (AUGMENTIN) 500-125 MG tablet Take 1 tablet (500 mg total) by mouth 2 (two) times daily. 60 tablet 0   No current facility-administered medications for this visit.    REVIEW OF SYSTEMS:  [X]  denotes positive  finding, [ ]  denotes negative finding Cardiac  Comments:  Chest pain or chest pressure:    Shortness of breath upon exertion:    Short of breath when lying flat:    Irregular heart rhythm:        Vascular    Pain in calf, thigh, or hip brought on by ambulation:    Pain in feet at night that wakes you up from your sleep:     Blood clot in your veins:    Leg swelling:         Pulmonary    Oxygen at home:    Productive cough:     Wheezing:         Neurologic    Sudden weakness in arms or legs:     Sudden numbness in arms or legs:     Sudden onset of difficulty speaking or slurred speech:    Temporary loss of vision in one eye:     Problems with dizziness:         Gastrointestinal    Blood in stool:     Vomited blood:         Genitourinary    Burning when  urinating:     Blood in urine:        Psychiatric    Major depression:         Hematologic    Bleeding problems:    Problems with blood clotting too easily:        Skin    Rashes or ulcers:        Constitutional    Fever or chills:      PHYSICAL EXAM: Vitals:   02/26/22 1210  BP: 122/83  Pulse: 68  Resp: 14  Temp: (!) 97.2 F (36.2 C)  TempSrc: Temporal  SpO2: 98%  Weight: 97 lb (44 kg)  Height: 5\' 2"  (1.575 m)    GENERAL: The patient is a well-nourished female, in no acute distress. The vital signs are documented above. CARDIAC: There is a regular rate and rhythm.  VASCULAR:  Bilateral brachial pulses are palpable On the right, I cannot palpate any radial or ulnar pulses On the left, I can palpate a weak radial pulse No upper extremity tissue loss Right BKA PULMONARY: No respiratory distress ABDOMEN: Soft and non-tender. NEUROLOGIC: No focal weakness or paresthesias are detected. SKIN: There are no ulcers or rashes noted. PSYCHIATRIC: The patient has a normal affect.  DATA:   Upper extremity arterial duplex shows biphasic waveforms in both upper extremities with significant calcification.  Vein mapping shows no usable cephalic or basilic vein in either upper extremity.  Assessment/Plan:  76 year old female presents for permanent hemodialysis access evaluation in the setting of end-stage renal disease on HD Monday and Friday using a right IJ tunneled catheter.  I discussed using her nondominant arm which would be her left arm.  She has no usable surface vein based on vein mapping today and discussed high likelihood of AV graft placement.  Discussed higher risk of infection and poorer durability if AV graft.  Discussed risk of bleeding, infection, failure to mature, steal syndrome.  We will get her scheduled for 03/13/2022 with me at [facility] on a Wednesday to not disrupt her dialysis schedule.   Marty Heck, MD [facility]  [place] Office: [phone]

## 2022-03-11 ENCOUNTER — Encounter (HOSPITAL_COMMUNITY): Payer: Self-pay | Admitting: Vascular Surgery

## 2022-03-11 NOTE — Progress Notes (Addendum)
Surgical Instructions for Molly Arroyo. Day of Surgery 03/13/22.    Your procedure is scheduled on Wednesday, 03/13/22.  Report to [facility] Main Entrance "A" at 6 A.M., then check in with the Admitting office.  Call this number if you have problems the morning of surgery:  [phone]   If you have any questions prior to your surgery date call [phone]: Open Monday-Friday 8am-4pm    Remember:  Do not eat or drink after midnight the night before your surgery-Tuesday    Take these medicines the morning of surgery with A SIP OF WATER:  acetaminophen (TYLENOL) PRN amLODipine (NORVASC atenolol (TENORMIN)  busPIRone (BUSPAR)  HYDROmorphone (DILAUDID) PRN PARoxetine (PAXIL)  As of today, STOP taking any Aspirin (unless otherwise instructed by your surgeon) Aleve, Naproxen, Ibuprofen, Motrin, Advil, Goody's, BC's, all herbal medications, fish oil, and all vitamins.   Do not take Tradgenta on the morning of surgery.  If your blood sugar is less than 70 mg/dL, you will need to treat for low blood sugar: Treat a low blood sugar (less than 70 mg/dL) with  cup of clear juice (cranberry or apple), 4 glucose tablets, OR glucose gel. Recheck blood sugar in 15 minutes after treatment (to make sure it is greater than 70 mg/dL). If your blood sugar is not greater than 70 mg/dL on recheck, call [phone] for further instructions.   Do not wear jewelry or makeup Do not wear lotions, powders, perfumes, or deodorant. Do not shave 48 hours prior to surgery.   Do not bring valuables to the hospital. Do not wear nail polish, gel polish, artificial nails, or any other type of covering on natural nails (fingers and toes) If you have artificial nails or gel coating that need to be removed by a nail salon, please have this removed prior to surgery. Artificial nails or gel coating may interfere with anesthesia's ability to adequately monitor your vital signs.  [facility] is not responsible for  any belongings or valuables. .   Do NOT Smoke (Tobacco/Vaping)  24 hours prior to your procedure  If you use a CPAP at night, you may bring your mask for your overnight stay.   Contacts, glasses, hearing aids, dentures or partials may not be worn into surgery, please bring cases for these belongings   For patients admitted to the hospital, discharge time will be determined by your treatment team.   Patients discharged the day of surgery will not be allowed to drive home, and someone needs to stay with them for 24 hours.   SURGICAL WAITING ROOM VISITATION Patients having surgery or a procedure in a hospital may have two support people. Children under the age of 71 must have an adult with them who is not the patient. They may stay in the waiting area during the procedure and may switch out with other visitors. If the patient needs to stay at the hospital during part of their recovery, the visitor guidelines for inpatient rooms apply.  Please refer to the [facility] website for the visitor guidelines for Inpatients (after your surgery is over and you are in a regular room).     Special instructions:    Oral Hygiene is also important to reduce your risk of infection.  Remember - BRUSH YOUR TEETH THE MORNING OF SURGERY WITH YOUR REGULAR TOOTHPASTE

## 2022-03-11 NOTE — Progress Notes (Signed)
Patient resides at [facility] phone [phone], fax [phone].  Faxed instructions for DOS to Nurse Hoyle Sauer (patient's nurse).  PCP - Dr Zenaida Deed Cardiologist - n/a  Chest x-ray - 10/19/21 CE - Portable EKG - DOS Stress Test - n/a ECHO - 06/2017 Cardiac Cath - n/a  ICD Pacemaker/Loop - n.a  Sleep Study -  n/a CPAP - none  Do not take Tradjenta on DOS.    If your blood sugar is less than 70 mg/dL, you will need to treat for low blood sugar. Treat a low blood sugar (less than 70 mg/dL) with  cup of clear juice (cranberry or apple), 4 glucose tablets, OR glucose gel. Recheck blood sugar in 15 minutes after treatment (to make sure it is greater than 70 mg/dL). If your blood sugar is not greater than 70 mg/dL on recheck, call [phone] for further instructions.  Blood Thinner Instructions:  Follow your surgeon's instructions on when to stop Plavix prior to surgery.  Last Dose was on 03/08/22.  Anesthesia review: Yes  STOP now taking any Aspirin (unless otherwise instructed by your surgeon), Aleve, Naproxen, Ibuprofen, Motrin, Advil, Goody's, BC's, all herbal medications, fish oil, and all vitamins.   Coronavirus Screening Does the patient have any of the following symptoms:  Cough yes/no: No Fever (>100.18F)  yes/no: No Runny nose yes/no: No Sore throat yes/no: No Difficulty breathing/shortness of breath  yes/no: No  Has the patient traveled in the last 14 days and where? yes/no: No

## 2022-03-13 ENCOUNTER — Encounter (HOSPITAL_COMMUNITY): Payer: Self-pay | Admitting: Vascular Surgery

## 2022-03-13 ENCOUNTER — Ambulatory Visit (HOSPITAL_BASED_OUTPATIENT_CLINIC_OR_DEPARTMENT_OTHER): Payer: Medicare (Managed Care) | Admitting: Emergency Medicine

## 2022-03-13 ENCOUNTER — Encounter (HOSPITAL_COMMUNITY)
Admission: RE | Disposition: A | Discharge status: Skilled Nursing Facility | Payer: Self-pay | Source: Home / Self Care | Attending: Vascular Surgery

## 2022-03-13 ENCOUNTER — Other Ambulatory Visit: Payer: Self-pay

## 2022-03-13 ENCOUNTER — Ambulatory Visit (HOSPITAL_COMMUNITY)
Admission: RE | Admit: 2022-03-13 | Discharge: 2022-03-13 | Disposition: A | Discharge status: Skilled Nursing Facility | Payer: Medicare (Managed Care) | Attending: Vascular Surgery | Admitting: Vascular Surgery

## 2022-03-13 ENCOUNTER — Ambulatory Visit (HOSPITAL_COMMUNITY): Payer: Medicare (Managed Care) | Admitting: Emergency Medicine

## 2022-03-13 DIAGNOSIS — Z89511 Acquired absence of right leg below knee: Secondary | ICD-10-CM | POA: Diagnosis not present

## 2022-03-13 DIAGNOSIS — N186 End stage renal disease: Secondary | ICD-10-CM

## 2022-03-13 DIAGNOSIS — Z7984 Long term (current) use of oral hypoglycemic drugs: Secondary | ICD-10-CM | POA: Insufficient documentation

## 2022-03-13 DIAGNOSIS — E039 Hypothyroidism, unspecified: Secondary | ICD-10-CM | POA: Insufficient documentation

## 2022-03-13 DIAGNOSIS — E1122 Type 2 diabetes mellitus with diabetic chronic kidney disease: Secondary | ICD-10-CM | POA: Diagnosis not present

## 2022-03-13 DIAGNOSIS — Z992 Dependence on renal dialysis: Secondary | ICD-10-CM | POA: Diagnosis not present

## 2022-03-13 DIAGNOSIS — I120 Hypertensive chronic kidney disease with stage 5 chronic kidney disease or end stage renal disease: Secondary | ICD-10-CM | POA: Diagnosis present

## 2022-03-13 DIAGNOSIS — N185 Chronic kidney disease, stage 5: Secondary | ICD-10-CM | POA: Diagnosis not present

## 2022-03-13 LAB — POCT I-STAT, CHEM 8
BUN: 23 mg/dL (ref 8–23)
Calcium, Ion: 1.08 mmol/L — ABNORMAL LOW (ref 1.15–1.40)
Chloride: 97 mmol/L — ABNORMAL LOW (ref 98–111)
Creatinine, Ser: 2.2 mg/dL — ABNORMAL HIGH (ref 0.44–1.00)
Glucose, Bld: 87 mg/dL (ref 70–99)
HCT: 40 % (ref 36.0–46.0)
Hemoglobin: 13.6 g/dL (ref 12.0–15.0)
Potassium: 3.4 mmol/L — ABNORMAL LOW (ref 3.5–5.1)
Sodium: 133 mmol/L — ABNORMAL LOW (ref 135–145)
TCO2: 27 mmol/L (ref 22–32)

## 2022-03-13 LAB — GLUCOSE, CAPILLARY
Glucose-Capillary: 88 mg/dL (ref 70–99)
Glucose-Capillary: 105 mg/dL — ABNORMAL HIGH (ref 70–99)
Glucose-Capillary: 98 mg/dL (ref 70–99)

## 2022-03-13 SURGERY — INSERTION OF ARTERIOVENOUS (AV) GORE-TEX GRAFT ARM
Anesthesia: Monitor Anesthesia Care | Site: Arm Upper | Laterality: Left

## 2022-03-13 MED ORDER — HEPARIN SODIUM (PORCINE) 1000 UNIT/ML IJ SOLN
INTRAMUSCULAR | Status: AC
  Filled 2022-03-13: qty 10

## 2022-03-13 MED ORDER — PHENYLEPHRINE HCL-NACL 20-0.9 MG/250ML-% IV SOLN
INTRAVENOUS | Status: DC | PRN
  Administered 2022-03-13: 50 ug/min via INTRAVENOUS

## 2022-03-13 MED ORDER — HYDROCODONE-ACETAMINOPHEN 5-325 MG PO TABS
1.0000 | ORAL_TABLET | Freq: Four times a day (QID) | ORAL | 0 refills | Status: DC | PRN
Start: 2022-03-13 — End: 2022-04-26

## 2022-03-13 MED ORDER — ACETAMINOPHEN 500 MG PO TABS
1000.0000 mg | ORAL_TABLET | Freq: Once | ORAL | Status: AC
  Administered 2022-03-13: 1000 mg via ORAL
  Filled 2022-03-13: qty 2

## 2022-03-13 MED ORDER — PROPOFOL 500 MG/50ML IV EMUL
INTRAVENOUS | Status: DC | PRN
  Administered 2022-03-13: 20 ug/kg/min via INTRAVENOUS

## 2022-03-13 MED ORDER — CHLORHEXIDINE GLUCONATE 4 % EX LIQD: 60.0000 mL | Freq: Once | CUTANEOUS | Status: DC

## 2022-03-13 MED ORDER — EPINEPHRINE PF 1 MG/ML IJ SOLN
INTRAMUSCULAR | Status: DC | PRN
  Administered 2022-03-13: .1 mg via SUBCUTANEOUS

## 2022-03-13 MED ORDER — CHLORHEXIDINE GLUCONATE 0.12 % MT SOLN
15.0000 mL | Freq: Once | OROMUCOSAL | Status: AC
  Administered 2022-03-13: 15 mL via OROMUCOSAL
  Filled 2022-03-13: qty 15

## 2022-03-13 MED ORDER — PROPOFOL 10 MG/ML IV BOLUS
INTRAVENOUS | Status: AC
  Filled 2022-03-13: qty 20

## 2022-03-13 MED ORDER — LIDOCAINE HCL (PF) 1 % IJ SOLN
INTRAMUSCULAR | Status: AC
  Filled 2022-03-13: qty 30

## 2022-03-13 MED ORDER — SODIUM CHLORIDE 0.9 % IV SOLN: INTRAVENOUS | Status: DC

## 2022-03-13 MED ORDER — EPHEDRINE SULFATE-NACL 50-0.9 MG/10ML-% IV SOSY
PREFILLED_SYRINGE | INTRAVENOUS | Status: DC | PRN
  Administered 2022-03-13 (×3): 5 mg via INTRAVENOUS

## 2022-03-13 MED ORDER — LIDOCAINE 2% (20 MG/ML) 5 ML SYRINGE
INTRAMUSCULAR | Status: DC | PRN
  Administered 2022-03-13: 30 mg via INTRAVENOUS

## 2022-03-13 MED ORDER — HEPARIN SODIUM (PORCINE) 1000 UNIT/ML IJ SOLN
INTRAMUSCULAR | Status: DC | PRN
  Administered 2022-03-13: 3000 [IU] via INTRAVENOUS

## 2022-03-13 MED ORDER — INSULIN ASPART 100 UNIT/ML IJ SOLN: 0.0000 [IU] | INTRAMUSCULAR | Status: DC | PRN

## 2022-03-13 MED ORDER — FENTANYL CITRATE (PF) 250 MCG/5ML IJ SOLN
INTRAMUSCULAR | Status: AC
  Filled 2022-03-13: qty 5

## 2022-03-13 MED ORDER — ORAL CARE MOUTH RINSE: 15.0000 mL | Freq: Once | OROMUCOSAL | Status: AC

## 2022-03-13 MED ORDER — 0.9 % SODIUM CHLORIDE (POUR BTL) OPTIME
TOPICAL | Status: DC | PRN
  Administered 2022-03-13: 1000 mL

## 2022-03-13 MED ORDER — MEPIVACAINE HCL (PF) 2 % IJ SOLN
INTRAMUSCULAR | Status: DC | PRN
  Administered 2022-03-13: 17 mL

## 2022-03-13 MED ORDER — CEFAZOLIN SODIUM-DEXTROSE 2-4 GM/100ML-% IV SOLN
2.0000 g | INTRAVENOUS | Status: AC
  Administered 2022-03-13: 2 g via INTRAVENOUS
  Filled 2022-03-13: qty 100

## 2022-03-13 MED ORDER — FENTANYL CITRATE (PF) 250 MCG/5ML IJ SOLN
INTRAMUSCULAR | Status: DC | PRN
  Administered 2022-03-13: 50 ug via INTRAVENOUS

## 2022-03-13 MED ORDER — HEPARIN 6000 UNIT IRRIGATION SOLUTION
Status: AC
  Filled 2022-03-13: qty 500

## 2022-03-13 MED ORDER — PHENYLEPHRINE 80 MCG/ML (10ML) SYRINGE FOR IV PUSH (FOR BLOOD PRESSURE SUPPORT)
PREFILLED_SYRINGE | INTRAVENOUS | Status: DC | PRN
  Administered 2022-03-13 (×3): 40 ug via INTRAVENOUS

## 2022-03-13 MED ORDER — HEPARIN 6000 UNIT IRRIGATION SOLUTION
Status: DC | PRN
  Administered 2022-03-13: 1

## 2022-03-13 SURGICAL SUPPLY — 45 items
ARMBAND PINK RESTRICT EXTREMIT (MISCELLANEOUS) ×4 IMPLANT
BAG COUNTER SPONGE SURGICOUNT (BAG) ×2 IMPLANT
BLADE CLIPPER SURG (BLADE) ×2 IMPLANT
CANISTER SUCT 3000ML PPV (MISCELLANEOUS) ×2 IMPLANT
CLIP TI MEDIUM 6 (CLIP) ×1 IMPLANT
CLIP TI WIDE RED SMALL 6 (CLIP) ×1 IMPLANT
CLIP VESOCCLUDE MED 6/CT (CLIP) ×1 IMPLANT
CLIP VESOCCLUDE SM WIDE 6/CT (CLIP) ×1 IMPLANT
COVER PROBE W GEL 5X96 (DRAPES) ×3 IMPLANT
DERMABOND ADVANCED (GAUZE/BANDAGES/DRESSINGS) ×2
DERMABOND ADVANCED .7 DNX12 (GAUZE/BANDAGES/DRESSINGS) ×1 IMPLANT
ELECT REM PT RETURN 9FT ADLT (ELECTROSURGICAL) ×2
ELECTRODE REM PT RTRN 9FT ADLT (ELECTROSURGICAL) ×1 IMPLANT
GLOVE BIO SURGEON STRL SZ7 (GLOVE) ×1 IMPLANT
GLOVE BIO SURGEON STRL SZ7.5 (GLOVE) ×3 IMPLANT
GLOVE BIOGEL PI IND STRL 7.0 (GLOVE) IMPLANT
GLOVE BIOGEL PI IND STRL 8 (GLOVE) ×1 IMPLANT
GLOVE BIOGEL PI INDICATOR 7.0 (GLOVE) ×1
GLOVE BIOGEL PI INDICATOR 8 (GLOVE) ×2
GOWN STRL REUS W/ TWL LRG LVL3 (GOWN DISPOSABLE) ×2 IMPLANT
GOWN STRL REUS W/ TWL XL LVL3 (GOWN DISPOSABLE) ×2 IMPLANT
GOWN STRL REUS W/TWL LRG LVL3 (GOWN DISPOSABLE) ×2
GOWN STRL REUS W/TWL XL LVL3 (GOWN DISPOSABLE) ×2
HEMOSTAT SPONGE AVITENE ULTRA (HEMOSTASIS) IMPLANT
KIT BASIN OR (CUSTOM PROCEDURE TRAY) ×2 IMPLANT
KIT TURNOVER KIT B (KITS) ×2 IMPLANT
LOOP VESSEL MINI RED (MISCELLANEOUS) ×1 IMPLANT
NS IRRIG 1000ML POUR BTL (IV SOLUTION) ×2 IMPLANT
PACK CV ACCESS (CUSTOM PROCEDURE TRAY) ×2 IMPLANT
PAD ARMBOARD 7.5X6 YLW CONV (MISCELLANEOUS) ×4 IMPLANT
SHEATH PROBE COVER 6X72 (BAG) ×1 IMPLANT
SLING ARM FOAM STRAP LRG (SOFTGOODS) IMPLANT
SLING ARM FOAM STRAP MED (SOFTGOODS) IMPLANT
SPIKE FLUID TRANSFER (MISCELLANEOUS) ×2 IMPLANT
SUT GORETEX 6.0 TT13 (SUTURE) IMPLANT
SUT MNCRL AB 4-0 PS2 18 (SUTURE) ×3 IMPLANT
SUT PROLENE 5 0 C 1 24 (SUTURE) ×1 IMPLANT
SUT PROLENE 6 0 BV (SUTURE) ×3 IMPLANT
SUT PROLENE 7 0 BV 1 (SUTURE) IMPLANT
SUT SILK 2 0 PERMA HAND 18 BK (SUTURE) ×1 IMPLANT
SUT VIC AB 3-0 SH 27 (SUTURE) ×2
SUT VIC AB 3-0 SH 27X BRD (SUTURE) ×2 IMPLANT
TOWEL GREEN STERILE (TOWEL DISPOSABLE) ×2 IMPLANT
UNDERPAD 30X36 HEAVY ABSORB (UNDERPADS AND DIAPERS) ×2 IMPLANT
WATER STERILE IRR 1000ML POUR (IV SOLUTION) ×2 IMPLANT

## 2022-03-13 NOTE — Anesthesia Procedure Notes (Signed)
Anesthesia Regional Block: Supraclavicular block   Pre-Anesthetic Checklist: , timeout performed,  Correct Patient, Correct Site, Correct Laterality,  Correct Procedure, Correct Position, site marked,  Risks and benefits discussed,  Pre-op evaluation,  At surgeon's request and post-op pain management  Laterality: Left  Prep: Maximum Sterile Barrier Precautions used, chloraprep       Needles:  Injection technique: Single-shot  Needle Type: Echogenic Stimulator Needle     Needle Length: 9cm  Needle Gauge: 22     Additional Needles:   Procedures:,,,, ultrasound used (permanent image in chart),,    Narrative:  Start time: 03/13/2022 8:12 AM End time: 03/13/2022 8:15 AM Injection made incrementally with aspirations every 5 mL.  Performed by: Personally  Anesthesiologist: Brennan Bailey, MD  Additional Notes: Risks, benefits, and alternative discussed. Patient gave consent for procedure. Patient prepped and draped in sterile fashion. Sedation administered, patient remains easily responsive to voice. Relevant anatomy identified with ultrasound guidance. Local anesthetic given in 5cc increments with no signs or symptoms of intravascular injection. No pain or paraesthesias with injection. Patient monitored throughout procedure with signs of LAST or immediate complications. Tolerated well. Ultrasound image placed in chart.  Tawny Asal, MD

## 2022-03-13 NOTE — Anesthesia Procedure Notes (Signed)
Procedure Name: MAC Date/Time: 03/13/2022 8:30 AM  Performed by: Mariea Clonts, CRNAPre-anesthesia Checklist: Patient identified, Emergency Drugs available, Suction available, Patient being monitored and Timeout performed Patient Re-evaluated:Patient Re-evaluated prior to induction Oxygen Delivery Method: Simple face mask Preoxygenation: Pre-oxygenation with 100% oxygen Induction Type: IV induction

## 2022-03-13 NOTE — Progress Notes (Signed)
Orthopedic Tech Progress Note Patient Details:  Molly Arroyo [birth year] [id number]  Ortho Devices Type of Ortho Device: Arm sling Ortho Device/Splint Interventions: Ordered      Linus Salmons Sondra Blixt 03/13/2022, 11:02 AM

## 2022-03-13 NOTE — Anesthesia Postprocedure Evaluation (Signed)
Anesthesia Post Note  Patient: Molly Arroyo  Procedure(s) Performed: INSERTION OF LEFT ARM ARTERIOVENOUS (AV) BRACHIOBASILIC FISTULA (Left: Arm Upper)     Patient location during evaluation: PACU Anesthesia Type: Regional Level of consciousness: awake and alert Pain management: pain level controlled Vital Signs Assessment: post-procedure vital signs reviewed and stable Respiratory status: spontaneous breathing, nonlabored ventilation and respiratory function stable Cardiovascular status: blood pressure returned to baseline Postop Assessment: no apparent nausea or vomiting Anesthetic complications: no   No notable events documented.  Last Vitals:  Vitals:   03/13/22 1025 03/13/22 1055  BP: (!) 90/55 (!) 109/54  Pulse: 63 65  Resp: 18 20  Temp:  36.5 C  SpO2: 97% 97%    Last Pain:  Vitals:   03/13/22 1055  PainSc: 0-No pain                 Marthenia Rolling

## 2022-03-13 NOTE — Op Note (Signed)
OPERATIVE NOTE   PROCEDURE: left first stage basilic vein transposition (brachiobasilic arteriovenous fistula) placement  PRE-OPERATIVE DIAGNOSIS: End stage renal disease  POST-OPERATIVE DIAGNOSIS: same  SURGEON: Marty Heck, MD  ASSISTANT(S): Arlee Muslim, PA  ANESTHESIA: regional  ESTIMATED BLOOD LOSS: Minimal  FINDING(S): 1.  Basilic vein: 2.5 mm, acceptable 2.  Brachial artery: 4 mm, atherosclerotic disease evident 3.  Venous outflow: palpable thrill  4.  Radial flow: doppler signal  SPECIMEN(S):  none  INDICATIONS:   Molly Arroyo is a 76 y.o. female who presents with end stage renal disease.  The patient is scheduled for left arm AVF versus AVG.  The patient is aware the risks include but are not limited to: bleeding, infection, steal syndrome, nerve damage, ischemic monomelic neuropathy, failure to mature, and need for additional procedures.  The patient is aware of the risks of the procedure and elects to proceed forward.  An assistant was needed for exposure and to help sew the anastomosis.  DESCRIPTION: After full informed written consent was obtained from the patient, the patient was brought back to the operating room and placed supine upon the operating table.  Prior to induction, the patient received IV antibiotics.   After obtaining adequate anesthesia, the patient was then prepped and draped in the standard fashion for a left arm access procedure.  I turned my attention first to identifying the patient's basilic vein.  This measured just over 2.5 mm and I thought it would be worth a try given her small size and high risk for steal syndrome with AVG.  I made a transverse incision at the level of the antecubitum and dissected through the subcutaneous tissue and fascia to gain exposure of the brachial artery.  This was noted to be 4 mm in diameter externally.  This was dissected out proximally and distally and controlled with vessel loops .  I then  dissected out the basilic vein.  This was noted to be 2.5 mm in diameter externally.  The distal segment of the vein was ligated with a  2-0 silk, and the vein was transected.  The proximal segment was interrogated with serial dilators.  The vein accepted up to a 4 mm dilator without any difficulty.  I then instilled the heparinized saline into the vein and clamped it.  At this point, I reset my exposure of the brachial artery.  The patient was given 3,000 units IV heparin.  I then placed the artery under tension proximally and distally.  I made an arteriotomy with a #11 blade, and then I extended the arteriotomy with a Potts scissor.  I injected heparinized saline proximal and distal to this arteriotomy.  The vein was then sewn to the artery in an end-to-side configuration with a running stitch of 6-0 Prolene with the help of my assistant.  Prior to completing this anastomosis, I allowed the vein and artery to backbleed.  There was no evidence of clot from any vessels.  I completed the anastomosis in the usual fashion and then released all vessel loops and clamps.    There was a palpable thrill in the venous outflow, and there was a dopplerable radial signal.  At this point, I irrigated out the surgical wound.  There was no further active bleeding.  The subcutaneous tissue was reapproximated with a running stitch of 3-0 Vicryl.  The skin was then reapproximated with a running subcuticular stitch of 4-0 Monocryl.  The skin was then cleaned, dried, and reinforced with Dermabond.  The patient tolerated this procedure well.   COMPLICATIONS: None  CONDITION: Stable  Marty Heck MD [facility] Office: [phone]  03/13/2022, 10:17 AM

## 2022-03-13 NOTE — Discharge Instructions (Signed)
° °  [facility]  ° °Discharge Instructions ° °AV Fistula or Graft Surgery for Dialysis Access ° °Please refer to the following instructions for your post-procedure care. Your surgeon or physician assistant will discuss any changes with you. ° °Activity ° °You may drive the day following your surgery, if you are comfortable and no longer taking prescription pain medication. Resume full activity as the soreness in your incision resolves. ° °Bathing/Showering ° °You may shower after you go home. Keep your incision dry for 48 hours. Do not soak in a bathtub, hot tub, or swim until the incision heals completely. You may not shower if you have a hemodialysis catheter. ° °Incision Care ° °Clean your incision with mild soap and water after 48 hours. Pat the area dry with a clean towel. You do not need a bandage unless otherwise instructed. Do not apply any ointments or creams to your incision. You may have skin glue on your incision. Do not peel it off. It will come off on its own in about one week. Your arm may swell a bit after surgery. To reduce swelling use pillows to elevate your arm so it is above your heart. Your doctor will tell you if you need to lightly wrap your arm with an ACE bandage. ° °Diet ° °Resume your normal diet. There are not special food restrictions following this procedure. In order to heal from your surgery, it is CRITICAL to get adequate nutrition. Your body requires vitamins, minerals, and protein. Vegetables are the best source of vitamins and minerals. Vegetables also provide the perfect balance of protein. Processed food has little nutritional value, so try to avoid this. ° °Medications ° °Resume taking all of your medications. If your incision is causing pain, you may take over-the counter pain relievers such as acetaminophen (Tylenol). If you were prescribed a stronger pain medication, please be aware these medications can cause nausea and constipation. Prevent  nausea by taking the medication with a snack or meal. Avoid constipation by drinking plenty of fluids and eating foods with high amount of fiber, such as fruits, vegetables, and grains. Do not take Tylenol if you are taking prescription pain medications. ° ° ° ° °Follow up °Your surgeon may want to see you in the office following your access surgery. If so, this will be arranged at the time of your surgery. ° °Please call us immediately for any of the following conditions: ° °Increased pain, redness, drainage (pus) from your incision site °Fever of 101 degrees or higher °Severe or worsening pain at your incision site °Hand pain or numbness. ° °Reduce your risk of vascular disease: ° °Stop smoking. If you would like help, call [facility] at 1-800-QUIT-NOW (1-800-784-8669) or [place] at 336-586-4000 ° °Manage your cholesterol °Maintain a desired weight °Control your diabetes °Keep your blood pressure down ° °Dialysis ° °It will take several weeks to several months for your new dialysis access to be ready for use. Your surgeon will determine when it is OK to use it. Your nephrologist will continue to direct your dialysis. You can continue to use your Permcath until your new access is ready for use. ° °If you have any questions, please call the office at [phone]. ° °

## 2022-03-13 NOTE — H&P (Signed)
History and Physical Interval Note:  03/13/2022 7:59 AM  Molly Arroyo  has presented today for surgery, with the diagnosis of ESRD.  The various methods of treatment have been discussed with the patient and family. After consideration of risks, benefits and other options for treatment, the patient has consented to  Procedure(s): INSERTION OF LEFT ARM ARTERIOVENOUS (AV) GORE-TEX GRAFT (Left) as a surgical intervention.  The patient's history has been reviewed, patient examined, no change in status, stable for surgery.  I have reviewed the patient's chart and labs.  Questions were answered to the patient's satisfaction.     Marty Heck  Patient name: Molly Arroyo         MRN: [id number]        DOB: 21-Feb-1946          Sex: female   REASON FOR CONSULT: Evaluate permanent hemodialysis access evaluation   HPI: Molly Arroyo is a 76 y.o. 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  Patient has no prior HD access in her upper arms in the past.  Currently using a right IJ tunneled catheter.  No other chest wall implants.  States she uses both arms and really her right arm is her dominant arm.  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  Referral from nephrology states place AV fistula or AV graft now.       Past Medical History:  Diagnosis Date   Chronic kidney disease     Diabetes mellitus without complication ([place])     Hypertension     Hypothyroidism     Thyroid disease        PSH: Right BKA   History reviewed. No pertinent family history.   SOCIAL HISTORY: Social History         Socioeconomic History   Marital status: Unknown      Spouse name: Not on file   Number of children: Not on file   Years of education: Not on file   Highest education level: Not on file  Occupational History   Not on file  Tobacco Use   Smoking status: Never   Smokeless tobacco: Not on file   Substance and Sexual Activity   Alcohol use: Never   Drug use: Never   Sexual activity: Not on file  Other Topics Concern   Not on file  Social History Narrative   Not on file    Social Determinants of Health    Financial Resource Strain: Not on file  Food Insecurity: Not on file  Transportation Needs: Not on file  Physical Activity: Not on file  Stress: Not on file  Social Connections: Not on file  Intimate Partner Violence: Not on file      No Known Allergies         Current Outpatient Medications  Medication Sig Dispense Refill   acetaminophen (TYLENOL) 325 MG tablet Take 2 tablets (650 mg total) by mouth every 6 (six) hours as needed for mild pain, moderate pain or headache (or Fever >/= 101).       amLODipine (NORVASC) 10 MG tablet Take 1 tablet (10 mg total) by mouth daily. 30 tablet 0   atenolol (TENORMIN) 50 MG tablet Take 1 tablet (50 mg total) by mouth daily. 30 tablet 0   clopidogrel (PLAVIX) 75 MG tablet Take 75 mg by mouth daily.       furosemide (LASIX) 20 MG  tablet Take 1 tablet (20 mg total) by mouth daily as needed for edema (for weight gain >3 lbs in 1 day of >5lbs in 1 week or worsening lower extremity swelling / shortness of breath laying down). 30 tablet 0   INVOKANA 100 MG TABS tablet Take 1 tablet (100 mg total) by mouth daily. 30 tablet 0   JANUVIA 50 MG tablet Take 0.5 tablets (25 mg total) by mouth daily. 30 tablet 0   ondansetron (ZOFRAN) 4 MG tablet Take 1 tablet (4 mg total) by mouth every 6 (six) hours as needed for nausea or vomiting. 30 tablet 0   rOPINIRole (REQUIP) 1 MG tablet Take 1 mg by mouth at bedtime as needed for other. Leg cramps       amoxicillin-clavulanate (AUGMENTIN) 500-125 MG tablet Take 1 tablet (500 mg total) by mouth 2 (two) times daily. 60 tablet 0    No current facility-administered medications for this visit.      REVIEW OF SYSTEMS:  [X]  denotes positive finding, [ ]  denotes negative finding Cardiac   Comments:  Chest  pain or chest pressure:      Shortness of breath upon exertion:      Short of breath when lying flat:      Irregular heart rhythm:             Vascular      Pain in calf, thigh, or hip brought on by ambulation:      Pain in feet at night that wakes you up from your sleep:       Blood clot in your veins:      Leg swelling:              Pulmonary      Oxygen at home:      Productive cough:       Wheezing:              Neurologic      Sudden weakness in arms or legs:       Sudden numbness in arms or legs:       Sudden onset of difficulty speaking or slurred speech:      Temporary loss of vision in one eye:       Problems with dizziness:              Gastrointestinal      Blood in stool:       Vomited blood:              Genitourinary      Burning when urinating:       Blood in urine:             Psychiatric      Major depression:              Hematologic      Bleeding problems:      Problems with blood clotting too easily:             Skin      Rashes or ulcers:             Constitutional      Fever or chills:          PHYSICAL EXAM:    Vitals:    02/26/22 1210  BP: 122/83  Pulse: 68  Resp: 14  Temp: (!) 97.2 F (36.2 C)  TempSrc: Temporal  SpO2: 98%  Weight: 97 lb (44 kg)  Height: 5\' 2"  (1.575 m)  GENERAL: The patient is a well-nourished female, in no acute distress. The vital signs are documented above. CARDIAC: There is a regular rate and rhythm.  VASCULAR:  Bilateral brachial pulses are palpable On the right, I cannot palpate any radial or ulnar pulses On the left, I can palpate a weak radial pulse No upper extremity tissue loss Right BKA PULMONARY: No respiratory distress ABDOMEN: Soft and non-tender. NEUROLOGIC: No focal weakness or paresthesias are detected. SKIN: There are no ulcers or rashes noted. PSYCHIATRIC: The patient has a normal affect.   DATA:    Upper extremity arterial duplex shows biphasic waveforms in both upper  extremities with significant calcification.   Vein mapping shows no usable cephalic or basilic vein in either upper extremity.   Assessment/Plan:   76 year old female presents for permanent hemodialysis access evaluation in the setting of end-stage renal disease on HD Monday and Friday using a right IJ tunneled catheter.  I discussed using her nondominant arm which would be her left arm.  She has no usable surface vein based on vein mapping today and discussed high likelihood of AV graft placement.  Discussed higher risk of infection and poorer durability if AV graft.  Discussed risk of bleeding, infection, failure to mature, steal syndrome.  We will get her scheduled for 03/13/2022 with me at [facility] on a Wednesday to not disrupt her dialysis schedule.     Marty Heck, MD [facility] [employer] Office: [phone]

## 2022-03-13 NOTE — Transfer of Care (Signed)
Immediate Anesthesia Transfer of Care Note  Patient: Molly Arroyo  Procedure(s) Performed: INSERTION OF LEFT ARM ARTERIOVENOUS (AV) BRACHIOBASILIC FISTULA (Left: Arm Upper)  Patient Location: PACU  Anesthesia Type:MAC combined with regional for post-op pain  Level of Consciousness: awake, alert  and oriented  Airway & Oxygen Therapy: Patient Spontanous Breathing  Post-op Assessment: Report given to RN and Post -op Vital signs reviewed and stable  Post vital signs: Reviewed and stable  Last Vitals:  Vitals Value Taken Time  BP 103/61 03/13/22 1010  Temp    Pulse 64 03/13/22 1013  Resp 13 03/13/22 1013  SpO2 100 % 03/13/22 1013  Vitals shown include unvalidated device data.  Last Pain:  Vitals:   03/13/22 0729  PainSc: 0-No pain         Complications: No notable events documented.

## 2022-03-13 NOTE — Anesthesia Preprocedure Evaluation (Addendum)
Anesthesia Evaluation  Patient identified by MRN, date of birth, ID band Patient awake    Reviewed: Allergy & Precautions, NPO status , Patient's Chart, lab work & pertinent test results  History of Anesthesia Complications Negative for: history of anesthetic complications  Airway Mallampati: II  TM Distance: >3 FB Neck ROM: Full    Dental  (+) Edentulous Lower, Edentulous Upper   Pulmonary neg pulmonary ROS,    Pulmonary exam normal        Cardiovascular hypertension, Pt. on medications Normal cardiovascular exam     Neuro/Psych negative neurological ROS  negative psych ROS   GI/Hepatic negative GI ROS, Neg liver ROS,   Endo/Other  diabetes, Type 2Hypothyroidism   Renal/GU ESRF and DialysisRenal disease  negative genitourinary   Musculoskeletal negative musculoskeletal ROS (+)   Abdominal   Peds  Hematology negative hematology ROS (+)   Anesthesia Other Findings Day of surgery medications reviewed with patient.  Reproductive/Obstetrics negative OB ROS                           Anesthesia Physical Anesthesia Plan  ASA: 3  Anesthesia Plan: Regional and MAC   Post-op Pain Management: Tylenol PO (pre-op)*   Induction:   PONV Risk Score and Plan: 2 and Treatment may vary due to age or medical condition, Ondansetron and Propofol infusion  Airway Management Planned: Natural Airway and Simple Face Mask  Additional Equipment: None  Intra-op Plan:   Post-operative Plan:   Informed Consent: I have reviewed the patients History and Physical, chart, labs and discussed the procedure including the risks, benefits and alternatives for the proposed anesthesia with the patient or authorized representative who has indicated his/her understanding and acceptance.       Plan Discussed with: CRNA  Anesthesia Plan Comments:        Anesthesia Quick Evaluation

## 2022-03-14 ENCOUNTER — Telehealth: Payer: Self-pay

## 2022-03-14 ENCOUNTER — Encounter (HOSPITAL_COMMUNITY): Payer: Self-pay | Admitting: Vascular Surgery

## 2022-03-17 ENCOUNTER — Other Ambulatory Visit: Payer: Self-pay

## 2022-03-17 DIAGNOSIS — N186 End stage renal disease: Secondary | ICD-10-CM

## 2022-03-28 NOTE — Telephone Encounter (Signed)
Appt scheduled

## 2022-04-15 ENCOUNTER — Emergency Department (HOSPITAL_COMMUNITY): Payer: Medicare (Managed Care)

## 2022-04-15 ENCOUNTER — Inpatient Hospital Stay (HOSPITAL_COMMUNITY)
Admission: EM | Admit: 2022-04-15 | Discharge: 2022-04-26 | DRG: 871 | Disposition: A | Discharge status: Hospice | Payer: Medicare (Managed Care) | Source: Skilled Nursing Facility | Attending: Internal Medicine | Admitting: Internal Medicine

## 2022-04-15 DIAGNOSIS — E11649 Type 2 diabetes mellitus with hypoglycemia without coma: Secondary | ICD-10-CM | POA: Diagnosis not present

## 2022-04-15 DIAGNOSIS — E039 Hypothyroidism, unspecified: Secondary | ICD-10-CM | POA: Diagnosis present

## 2022-04-15 DIAGNOSIS — D631 Anemia in chronic kidney disease: Secondary | ICD-10-CM | POA: Diagnosis present

## 2022-04-15 DIAGNOSIS — L8990 Pressure ulcer of unspecified site, unspecified stage: Secondary | ICD-10-CM | POA: Insufficient documentation

## 2022-04-15 DIAGNOSIS — Z681 Body mass index (BMI) 19 or less, adult: Secondary | ICD-10-CM

## 2022-04-15 DIAGNOSIS — Z7984 Long term (current) use of oral hypoglycemic drugs: Secondary | ICD-10-CM

## 2022-04-15 DIAGNOSIS — I120 Hypertensive chronic kidney disease with stage 5 chronic kidney disease or end stage renal disease: Secondary | ICD-10-CM | POA: Diagnosis present

## 2022-04-15 DIAGNOSIS — I953 Hypotension of hemodialysis: Secondary | ICD-10-CM | POA: Diagnosis not present

## 2022-04-15 DIAGNOSIS — E871 Hypo-osmolality and hyponatremia: Secondary | ICD-10-CM | POA: Diagnosis present

## 2022-04-15 DIAGNOSIS — F0390 Unspecified dementia without behavioral disturbance: Secondary | ICD-10-CM | POA: Diagnosis present

## 2022-04-15 DIAGNOSIS — Y95 Nosocomial condition: Secondary | ICD-10-CM | POA: Diagnosis present

## 2022-04-15 DIAGNOSIS — R64 Cachexia: Secondary | ICD-10-CM | POA: Diagnosis present

## 2022-04-15 DIAGNOSIS — E1165 Type 2 diabetes mellitus with hyperglycemia: Secondary | ICD-10-CM | POA: Diagnosis present

## 2022-04-15 DIAGNOSIS — E440 Moderate protein-calorie malnutrition: Secondary | ICD-10-CM | POA: Diagnosis present

## 2022-04-15 DIAGNOSIS — L89312 Pressure ulcer of right buttock, stage 2: Secondary | ICD-10-CM | POA: Diagnosis present

## 2022-04-15 DIAGNOSIS — L03114 Cellulitis of left upper limb: Secondary | ICD-10-CM | POA: Diagnosis present

## 2022-04-15 DIAGNOSIS — G928 Other toxic encephalopathy: Secondary | ICD-10-CM | POA: Diagnosis present

## 2022-04-15 DIAGNOSIS — E876 Hypokalemia: Secondary | ICD-10-CM | POA: Diagnosis present

## 2022-04-15 DIAGNOSIS — R6521 Severe sepsis with septic shock: Secondary | ICD-10-CM | POA: Diagnosis present

## 2022-04-15 DIAGNOSIS — Z7189 Other specified counseling: Secondary | ICD-10-CM | POA: Diagnosis not present

## 2022-04-15 DIAGNOSIS — I471 Supraventricular tachycardia: Secondary | ICD-10-CM | POA: Diagnosis not present

## 2022-04-15 DIAGNOSIS — N186 End stage renal disease: Secondary | ICD-10-CM | POA: Diagnosis present

## 2022-04-15 DIAGNOSIS — A419 Sepsis, unspecified organism: Principal | ICD-10-CM | POA: Diagnosis present

## 2022-04-15 DIAGNOSIS — Z515 Encounter for palliative care: Secondary | ICD-10-CM

## 2022-04-15 DIAGNOSIS — Z992 Dependence on renal dialysis: Secondary | ICD-10-CM | POA: Diagnosis not present

## 2022-04-15 DIAGNOSIS — L89152 Pressure ulcer of sacral region, stage 2: Secondary | ICD-10-CM | POA: Diagnosis present

## 2022-04-15 DIAGNOSIS — R680 Hypothermia, not associated with low environmental temperature: Secondary | ICD-10-CM | POA: Diagnosis present

## 2022-04-15 DIAGNOSIS — Z79899 Other long term (current) drug therapy: Secondary | ICD-10-CM

## 2022-04-15 DIAGNOSIS — E11621 Type 2 diabetes mellitus with foot ulcer: Secondary | ICD-10-CM | POA: Diagnosis present

## 2022-04-15 DIAGNOSIS — E872 Acidosis, unspecified: Secondary | ICD-10-CM | POA: Diagnosis present

## 2022-04-15 DIAGNOSIS — J189 Pneumonia, unspecified organism: Secondary | ICD-10-CM | POA: Diagnosis present

## 2022-04-15 DIAGNOSIS — Z66 Do not resuscitate: Secondary | ICD-10-CM | POA: Diagnosis not present

## 2022-04-15 DIAGNOSIS — E8770 Fluid overload, unspecified: Secondary | ICD-10-CM | POA: Diagnosis present

## 2022-04-15 DIAGNOSIS — E1122 Type 2 diabetes mellitus with diabetic chronic kidney disease: Secondary | ICD-10-CM | POA: Diagnosis present

## 2022-04-15 DIAGNOSIS — Z89511 Acquired absence of right leg below knee: Secondary | ICD-10-CM

## 2022-04-15 DIAGNOSIS — L97529 Non-pressure chronic ulcer of other part of left foot with unspecified severity: Secondary | ICD-10-CM | POA: Diagnosis present

## 2022-04-15 DIAGNOSIS — M898X9 Other specified disorders of bone, unspecified site: Secondary | ICD-10-CM | POA: Diagnosis present

## 2022-04-15 DIAGNOSIS — D696 Thrombocytopenia, unspecified: Secondary | ICD-10-CM | POA: Diagnosis not present

## 2022-04-15 DIAGNOSIS — R627 Adult failure to thrive: Secondary | ICD-10-CM | POA: Diagnosis present

## 2022-04-15 DIAGNOSIS — R54 Age-related physical debility: Secondary | ICD-10-CM | POA: Diagnosis present

## 2022-04-15 DIAGNOSIS — Z7902 Long term (current) use of antithrombotics/antiplatelets: Secondary | ICD-10-CM

## 2022-04-15 DIAGNOSIS — H9190 Unspecified hearing loss, unspecified ear: Secondary | ICD-10-CM | POA: Diagnosis present

## 2022-04-15 DIAGNOSIS — E8809 Other disorders of plasma-protein metabolism, not elsewhere classified: Secondary | ICD-10-CM | POA: Diagnosis present

## 2022-04-15 HISTORY — DX: End stage renal disease: Z99.2

## 2022-04-15 LAB — CBC WITH DIFFERENTIAL/PLATELET
Abs Immature Granulocytes: 0.1 10*3/uL — ABNORMAL HIGH (ref 0.00–0.07)
Basophils Absolute: 0.1 10*3/uL (ref 0.0–0.1)
Basophils Relative: 0 %
Eosinophils Absolute: 0.1 10*3/uL (ref 0.0–0.5)
Eosinophils Relative: 1 %
HCT: 33 % — ABNORMAL LOW (ref 36.0–46.0)
Hemoglobin: 10.7 g/dL — ABNORMAL LOW (ref 12.0–15.0)
Immature Granulocytes: 1 %
Lymphocytes Relative: 4 %
Lymphs Abs: 0.7 10*3/uL (ref 0.7–4.0)
MCH: 31.4 pg (ref 26.0–34.0)
MCHC: 32.4 g/dL (ref 30.0–36.0)
MCV: 96.8 fL (ref 80.0–100.0)
Monocytes Absolute: 0.6 10*3/uL (ref 0.1–1.0)
Monocytes Relative: 3 %
Neutro Abs: 16.7 10*3/uL — ABNORMAL HIGH (ref 1.7–7.7)
Neutrophils Relative %: 91 %
Platelets: 155 10*3/uL (ref 150–400)
RBC: 3.41 MIL/uL — ABNORMAL LOW (ref 3.87–5.11)
RDW: 17.7 % — ABNORMAL HIGH (ref 11.5–15.5)
WBC: 18.2 10*3/uL — ABNORMAL HIGH (ref 4.0–10.5)
nRBC: 0 % (ref 0.0–0.2)

## 2022-04-15 LAB — COMPREHENSIVE METABOLIC PANEL
ALT: 7 U/L (ref 0–44)
AST: 24 U/L (ref 15–41)
Albumin: <1.5 g/dL — ABNORMAL LOW (ref 3.5–5.0)
Alkaline Phosphatase: 98 U/L (ref 38–126)
Anion gap: 7 (ref 5–15)
BUN: 13 mg/dL (ref 8–23)
CO2: 22 mmol/L (ref 22–32)
Calcium: 7.2 mg/dL — ABNORMAL LOW (ref 8.9–10.3)
Chloride: 105 mmol/L (ref 98–111)
Creatinine, Ser: 2.15 mg/dL — ABNORMAL HIGH (ref 0.44–1.00)
GFR, Estimated: 23 mL/min — ABNORMAL LOW (ref >60)
Glucose, Bld: 107 mg/dL — ABNORMAL HIGH (ref 70–99)
Potassium: 3 mmol/L — ABNORMAL LOW (ref 3.5–5.1)
Sodium: 134 mmol/L — ABNORMAL LOW (ref 135–145)
Total Bilirubin: 0.7 mg/dL (ref 0.3–1.2)
Total Protein: 4.6 g/dL — ABNORMAL LOW (ref 6.5–8.1)

## 2022-04-15 LAB — CBC
HCT: 27.1 % — ABNORMAL LOW (ref 36.0–46.0)
Hemoglobin: 9.5 g/dL — ABNORMAL LOW (ref 12.0–15.0)
MCH: 31.3 pg (ref 26.0–34.0)
MCHC: 35.1 g/dL (ref 30.0–36.0)
MCV: 89.1 fL (ref 80.0–100.0)
Platelets: 158 10*3/uL (ref 150–400)
RBC: 3.04 MIL/uL — ABNORMAL LOW (ref 3.87–5.11)
RDW: 17.3 % — ABNORMAL HIGH (ref 11.5–15.5)
WBC: 22.9 10*3/uL — ABNORMAL HIGH (ref 4.0–10.5)
nRBC: 0 % (ref 0.0–0.2)

## 2022-04-15 LAB — I-STAT CHEM 8, ED
BUN: 16 mg/dL (ref 8–23)
Calcium, Ion: 0.94 mmol/L — ABNORMAL LOW (ref 1.15–1.40)
Chloride: 101 mmol/L (ref 98–111)
Creatinine, Ser: 2.1 mg/dL — ABNORMAL HIGH (ref 0.44–1.00)
Glucose, Bld: 99 mg/dL (ref 70–99)
HCT: 35 % — ABNORMAL LOW (ref 36.0–46.0)
Hemoglobin: 11.9 g/dL — ABNORMAL LOW (ref 12.0–15.0)
Potassium: 2.9 mmol/L — ABNORMAL LOW (ref 3.5–5.1)
Sodium: 134 mmol/L — ABNORMAL LOW (ref 135–145)
TCO2: 23 mmol/L (ref 22–32)

## 2022-04-15 LAB — LACTIC ACID, PLASMA
Lactic Acid, Venous: 2.8 mmol/L (ref 0.5–1.9)
Lactic Acid, Venous: 1 mmol/L (ref 0.5–1.9)

## 2022-04-15 LAB — PROTIME-INR
INR: 1.4 — ABNORMAL HIGH (ref 0.8–1.2)
Prothrombin Time: 17.4 seconds — ABNORMAL HIGH (ref 11.4–15.2)

## 2022-04-15 LAB — GLUCOSE, CAPILLARY
Glucose-Capillary: 140 mg/dL — ABNORMAL HIGH (ref 70–99)
Glucose-Capillary: 175 mg/dL — ABNORMAL HIGH (ref 70–99)

## 2022-04-15 LAB — MRSA NEXT GEN BY PCR, NASAL: MRSA by PCR Next Gen: DETECTED — AB

## 2022-04-15 LAB — MAGNESIUM: Magnesium: 1.8 mg/dL (ref 1.7–2.4)

## 2022-04-15 LAB — TROPONIN I (HIGH SENSITIVITY)
Troponin I (High Sensitivity): 10 ng/L (ref <18)
Troponin I (High Sensitivity): 13 ng/L (ref <18)

## 2022-04-15 MED ORDER — CHLORHEXIDINE GLUCONATE CLOTH 2 % EX PADS
6.0000 | MEDICATED_PAD | Freq: Every day | CUTANEOUS | Status: AC
  Administered 2022-04-15 – 2022-04-20 (×5): 6 via TOPICAL

## 2022-04-15 MED ORDER — SODIUM CHLORIDE 0.9 % IV SOLN: 2.0000 g | INTRAVENOUS | Status: DC

## 2022-04-15 MED ORDER — SODIUM CHLORIDE 0.9% FLUSH
10.0000 mL | INTRAVENOUS | Status: DC | PRN
  Administered 2022-04-22: 10 mL

## 2022-04-15 MED ORDER — MUPIROCIN 2 % EX OINT
1.0000 | TOPICAL_OINTMENT | Freq: Two times a day (BID) | CUTANEOUS | Status: AC
  Administered 2022-04-15 – 2022-04-20 (×10): 1 via NASAL
  Filled 2022-04-15 (×2): qty 22

## 2022-04-15 MED ORDER — CHLORHEXIDINE GLUCONATE CLOTH 2 % EX PADS
6.0000 | MEDICATED_PAD | Freq: Every day | CUTANEOUS | Status: DC
  Administered 2022-04-15: 6 via TOPICAL

## 2022-04-15 MED ORDER — LACTATED RINGERS IV BOLUS
250.0000 mL | Freq: Once | INTRAVENOUS | Status: AC
Start: 2022-04-15 — End: 2022-04-15
  Administered 2022-04-15: 250 mL via INTRAVENOUS

## 2022-04-15 MED ORDER — VANCOMYCIN HCL IN DEXTROSE 1-5 GM/200ML-% IV SOLN
1000.0000 mg | Freq: Once | INTRAVENOUS | Status: AC
  Administered 2022-04-15: 1000 mg via INTRAVENOUS
  Filled 2022-04-15: qty 200

## 2022-04-15 MED ORDER — SODIUM CHLORIDE 0.9 % IV SOLN
1.0000 g | INTRAVENOUS | Status: AC
  Administered 2022-04-16 – 2022-04-20 (×5): 1 g via INTRAVENOUS
  Filled 2022-04-15 (×7): qty 10

## 2022-04-15 MED ORDER — LACTATED RINGERS IV BOLUS
250.0000 mL | Freq: Once | INTRAVENOUS | Status: AC
  Administered 2022-04-15: 250 mL via INTRAVENOUS

## 2022-04-15 MED ORDER — POTASSIUM CHLORIDE 10 MEQ/100ML IV SOLN
10.0000 meq | INTRAVENOUS | Status: AC
  Administered 2022-04-15 – 2022-04-16 (×6): 10 meq via INTRAVENOUS
  Filled 2022-04-15 (×6): qty 100

## 2022-04-15 MED ORDER — LACTATED RINGERS IV BOLUS
500.0000 mL | Freq: Once | INTRAVENOUS | Status: AC
  Administered 2022-04-15: 500 mL via INTRAVENOUS

## 2022-04-15 MED ORDER — LORAZEPAM 2 MG/ML IJ SOLN
1.0000 mg | Freq: Once | INTRAMUSCULAR | Status: AC
  Administered 2022-04-15: 1 mg via INTRAVENOUS
  Filled 2022-04-15: qty 1

## 2022-04-15 MED ORDER — LACTATED RINGERS IV SOLN: INTRAVENOUS | Status: DC

## 2022-04-15 MED ORDER — METRONIDAZOLE 500 MG/100ML IV SOLN
500.0000 mg | Freq: Once | INTRAVENOUS | Status: AC
Start: 2022-04-15 — End: 2022-04-15
  Administered 2022-04-15: 500 mg via INTRAVENOUS
  Filled 2022-04-15: qty 100

## 2022-04-15 MED ORDER — NOREPINEPHRINE 4 MG/250ML-% IV SOLN
0.0000 ug/min | INTRAVENOUS | Status: DC
  Administered 2022-04-15: 19 ug/min via INTRAVENOUS
  Administered 2022-04-15: 5 ug/min via INTRAVENOUS
  Administered 2022-04-16: 15 ug/min via INTRAVENOUS
  Administered 2022-04-16 (×2): 14 ug/min via INTRAVENOUS
  Filled 2022-04-15 (×5): qty 250

## 2022-04-15 MED ORDER — SODIUM CHLORIDE 0.9% FLUSH
10.0000 mL | Freq: Two times a day (BID) | INTRAVENOUS | Status: DC
  Administered 2022-04-16: 20 mL
  Administered 2022-04-17: 10 mL
  Administered 2022-04-17: 20 mL
  Administered 2022-04-18 – 2022-04-20 (×4): 10 mL
  Administered 2022-04-21: 40 mL
  Administered 2022-04-21 – 2022-04-24 (×6): 10 mL

## 2022-04-15 MED ORDER — SODIUM CHLORIDE 0.9 % IV SOLN
2.0000 g | Freq: Once | INTRAVENOUS | Status: AC
  Administered 2022-04-15: 2 g via INTRAVENOUS
  Filled 2022-04-15: qty 12.5

## 2022-04-15 MED ORDER — POLYETHYLENE GLYCOL 3350 17 G PO PACK: 17.0000 g | PACK | Freq: Every day | ORAL | Status: DC | PRN

## 2022-04-15 MED ORDER — DOCUSATE SODIUM 100 MG PO CAPS: 100.0000 mg | ORAL_CAPSULE | Freq: Two times a day (BID) | ORAL | Status: DC | PRN

## 2022-04-15 MED ORDER — HEPARIN SODIUM (PORCINE) 5000 UNIT/ML IJ SOLN
5000.0000 [IU] | Freq: Three times a day (TID) | INTRAMUSCULAR | Status: DC
  Administered 2022-04-15 – 2022-04-24 (×25): 5000 [IU] via SUBCUTANEOUS
  Filled 2022-04-15 (×25): qty 1

## 2022-04-15 MED ORDER — VANCOMYCIN HCL 500 MG/100ML IV SOLN: 500.0000 mg | INTRAVENOUS | Status: DC

## 2022-04-15 MED ORDER — HEPARIN SODIUM (PORCINE) 1000 UNIT/ML IJ SOLN
4000.0000 [IU] | Freq: Once | INTRAMUSCULAR | Status: AC
  Administered 2022-04-15: 4000 [IU] via INTRAVENOUS

## 2022-04-15 NOTE — Progress Notes (Addendum)
Pharmacy Antibiotic Note  Molly Arroyo is a 76 y.o. female for which pharmacy has been consulted for vancomycin and cefepime dosing for sepsis.  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 Patient presenting from HD center after becoming minimally responsive and hypotensive.  WBC 18.2; LA 2.8; T 96.6>98.6; HR 65; RR 18  Plan: Vancomycin 1g IV once given in ED - subsequent dosing per HD schedule Metronidazole per MD Cefepime 1g q24hr Trend WBC, Fever, Renal function, & Clinical course F/u cultures, clinical course, WBC, fever De-escalate when able F/u Nephrology plan     No data recorded.  Recent Labs  Lab 04/15/22 1412 04/15/22 1427  WBC 18.2*  --   CREATININE  --  2.10*    CrCl cannot be calculated (Unknown ideal weight.).    No Known Allergies  Antimicrobials this admission: vancomycin 7/24 >>  Metronidazole 7/24 >>  cefepime 7/24 >>   Microbiology results: Pending  Thank you for allowing pharmacy to be a part of this patient's care.  Lorelei Pont, PharmD, BCPS 04/15/2022 2:43 PM ED Clinical Pharmacist -  [phone]

## 2022-04-15 NOTE — ED Provider Notes (Signed)
[facility] EMERGENCY DEPARTMENT Provider Note   CSN: [id number] Arrival date & time: 04/15/22  1341     History  Chief Complaint  Patient presents with   Hypotension    Molly Arroyo is a 76 y.o. female.  HPI Patient presents for loss of consciousness.  Medical history includes T2DM, right foot diabetic ulcer s/p BKA, ESRD, and dementia.  Patient was 1 hour into 3-hour session of dialysis earlier today.  She had a loss of consciousness.  She was awake but altered when EMS arrived on scene.  There initial blood pressure was 80s over 70s.  Blood sugar was normal.  During transit, patient had continued hypotension.  She also developed hypoxia with SPO2 in the 80s.  She was placed on nonrebreather with normalization of SPO2.  History from patient unable to be obtained due to dementia.    Home Medications Prior to Admission medications   Medication Sig Start Date End Date Taking? Authorizing Provider  acetaminophen (TYLENOL) 325 MG tablet Take 2 tablets (650 mg total) by mouth every 6 (six) hours as needed for mild pain, moderate pain or headache (or Fever >/= 101). 02/14/21   Jeralyn Bennett, MD  Amino Acids-Protein Hydrolys (FEEDING SUPPLEMENT, PRO-STAT 64,) LIQD Take 30 mLs by mouth 2 (two) times daily. For wound healing    [provider]  amLODipine (NORVASC) 10 MG tablet Take 1 tablet (10 mg total) by mouth daily. 02/14/21   Jeralyn Bennett, MD  busPIRone (BUSPAR) 10 MG tablet Take 15 mg by mouth 3 (three) times daily. 07/18/21   [provider]  clopidogrel (PLAVIX) 75 MG tablet Take 75 mg by mouth daily. 01/18/21   [provider]  furosemide (LASIX) 20 MG tablet Take 1 tablet (20 mg total) by mouth daily as needed for edema (for weight gain >3 lbs in 1 day of >5lbs in 1 week or worsening lower extremity swelling / shortness of breath laying down). Patient taking differently: Take 20 mg by mouth daily. 02/14/21   Jeralyn Bennett, MD   HYDROcodone-acetaminophen (NORCO) 5-325 MG tablet Take 1 tablet by mouth every 6 (six) hours as needed for moderate pain. 03/13/22   Dagoberto Ligas, PA-C  HYDROmorphone (DILAUDID) 4 MG tablet Take 4 mg by mouth every 8 (eight) hours as needed for pain. 09/21/21   [provider]  Nutritional Supplements (NUTRITIONAL SUPPLEMENT PO) Take 237 mLs by mouth 2 (two) times daily. House shake    [provider]  PARoxetine (PAXIL) 40 MG tablet Take 40 mg by mouth daily. 03/08/22   [provider]  TRADJENTA 5 MG TABS tablet Take 5 mg by mouth daily. 03/05/22   [provider]  vitamin B-12 (CYANOCOBALAMIN) 500 MCG tablet Take 500 mcg by mouth daily. 03/20/21   [provider]      Allergies    Patient has no known allergies.    Review of Systems   Review of Systems  Unable to perform ROS: Dementia    Physical Exam Updated Vital Signs BP (!) 85/38   Pulse 64   Temp (!) 96.6 F (35.9 C)   Resp (!) 9   SpO2 100%  Physical Exam Constitutional:      General: She is not in acute distress.    Appearance: She is ill-appearing. She is not toxic-appearing or diaphoretic.  HENT:     Head: Normocephalic and atraumatic.     Right Ear: External ear normal.     Left Ear: External ear normal.  Nose: Nose normal.     Mouth/Throat:     Mouth: Mucous membranes are moist.     Pharynx: Oropharynx is clear.  Eyes:     Extraocular Movements: Extraocular movements intact.  Cardiovascular:     Rate and Rhythm: Normal rate and regular rhythm.     Comments: Healing fistula in place in left forearm.  Dialysis catheter in place in right upper chest. Pulmonary:     Effort: Pulmonary effort is normal. No respiratory distress.     Breath sounds: Normal breath sounds. No wheezing or rales.  Abdominal:     Tenderness: There is no abdominal tenderness. There is no guarding.  Musculoskeletal:        General: No swelling or deformity.     Cervical back: Normal range  of motion and neck supple. No tenderness.     Comments: Right BKA  Skin:    General: Skin is cool and dry.  Neurological:     General: No focal deficit present.     Mental Status: She is alert. She is disoriented.  Psychiatric:        Mood and Affect: Mood normal.        Behavior: Behavior normal.     ED Results / Procedures / Treatments   Labs (all labs ordered are listed, but only abnormal results are displayed) Labs Reviewed  CBC WITH DIFFERENTIAL/PLATELET - Abnormal; Notable for the following components:      Result Value   WBC 18.2 (*)    RBC 3.41 (*)    Hemoglobin 10.7 (*)    HCT 33.0 (*)    RDW 17.7 (*)    Neutro Abs 16.7 (*)    Abs Immature Granulocytes 0.10 (*)    All other components within normal limits  COMPREHENSIVE METABOLIC PANEL - Abnormal; Notable for the following components:   Sodium 134 (*)    Potassium 3.0 (*)    Glucose, Bld 107 (*)    Creatinine, Ser 2.15 (*)    Calcium 7.2 (*)    Total Protein 4.6 (*)    Albumin <1.5 (*)    GFR, Estimated 23 (*)    All other components within normal limits  LACTIC ACID, PLASMA - Abnormal; Notable for the following components:   Lactic Acid, Venous 2.8 (*)    All other components within normal limits  PROTIME-INR - Abnormal; Notable for the following components:   Prothrombin Time 17.4 (*)    INR 1.4 (*)    All other components within normal limits  I-STAT CHEM 8, ED - Abnormal; Notable for the following components:   Sodium 134 (*)    Potassium 2.9 (*)    Creatinine, Ser 2.10 (*)    Calcium, Ion 0.94 (*)    Hemoglobin 11.9 (*)    HCT 35.0 (*)    All other components within normal limits  CULTURE, BLOOD (ROUTINE X 2)  CULTURE, BLOOD (ROUTINE X 2)  URINE CULTURE  MAGNESIUM  LACTIC ACID, PLASMA  URINALYSIS, ROUTINE W REFLEX MICROSCOPIC  TROPONIN I (HIGH SENSITIVITY)  TROPONIN I (HIGH SENSITIVITY)    EKG EKG Interpretation  Date/Time:  Monday April 15 2022 13:43:29 EDT Ventricular Rate:  77 PR  Interval:  41 QRS Duration: 90 QT Interval:  473 QTC Calculation: 536 R Axis:   49 Text Interpretation: Indeterminate rhythm Prolonged QT interval Artifact in lead(s) I III aVR aVL aVF Confirmed by Godfrey Pick ([id number]) on 04/15/2022 3:01:10 PM  Radiology CT CHEST ABDOMEN PELVIS WO CONTRAST  Result Date: 04/15/2022 CLINICAL DATA:  Sepsis EXAM: CT CHEST, ABDOMEN AND PELVIS WITHOUT CONTRAST TECHNIQUE: Multidetector CT imaging of the chest, abdomen and pelvis was performed following the standard protocol without IV contrast. RADIATION DOSE REDUCTION: This exam was performed according to the departmental dose-optimization program which includes automated exposure control, adjustment of the mA and/or kV according to patient size and/or use of iterative reconstruction technique. COMPARISON:  Chest radiography same day.  CT 08/29/2021. FINDINGS: CT CHEST FINDINGS Cardiovascular: Heart size is normal. No pericardial fluid. Coronary artery calcification and aortic atherosclerotic calcification as seen previously. Left internal jugular catheter with tip at the SVC RA junction. Mediastinum/Nodes: No mediastinal or hilar mass or lymphadenopathy seen on this study done without contrast. Lungs/Pleura: Right lower lobe collapse/pneumonia. Right middle lobe collapse. Right upper lobe is clear. Small amount of layering pleural fluid on the right. On the left, there is a small amount of layering pleural fluid with dependent atelectasis. Patchy perihilar pneumonia in the lingula and left upper lobe. Musculoskeletal: No thoracic spinal fracture.  No rib fracture. CT ABDOMEN PELVIS FINDINGS Hepatobiliary: Liver parenchyma appears normal. No calcified gallstones. Pancreas: Chronic calcifications throughout the pancreas consistent with previous pancreatitis. No evidence of acute inflammation. Spleen: Normal Adrenals/Urinary Tract: Adrenal glands are normal. Multiple indeterminate renal lesions as seen previously that could represent  cysts. Mass is not excluded. As noted previously, MRI could be performed to more accurately characterize the renal lesions. Mild wall thickening of the bladder. Cystitis not excluded. Stomach/Bowel: Stomach and small intestine are normal. No evidence of acute colon pathology. The patient does have a large amount of fecal matter in the rectum but no inflammatory changes. Vascular/Lymphatic: Aortic atherosclerosis. No aneurysm. IVC is normal. No adenopathy. Reproductive: No pelvic mass. Other: No free fluid or air. Nonspecific body wall edema/anasarca, particularly along the flanks. Musculoskeletal: Chronic degenerative changes throughout the lumbar region. IMPRESSION: Right lower lobe and right middle lobe pneumonia/volume loss. Patchy infiltrates in the lingula and left upper lobe. Pleural effusions layering dependently. Coronary artery calcification and aortic atherosclerotic calcification. No acute intra-abdominal finding. No sign of bowel inflammatory disease. Thick-walled bladder.  Cystitis not excluded. Chronic pancreatic calcifications. Chronic renal lesions. These may simply be cysts, but they cannot be accurately characterize. As recommended previously, renal/abdominal MRI could be performed electively to accurately characterize these renal lesions. Nonspecific body wall edema/anasarca. Electronically Signed   By: Nelson Chimes M.D.   On: 04/15/2022 16:49   CT HEAD WO CONTRAST  Result Date: 04/15/2022 CLINICAL DATA:  Mental status change, unknown cause EXAM: CT HEAD WITHOUT CONTRAST TECHNIQUE: Contiguous axial images were obtained from the base of the skull through the vertex without intravenous contrast. RADIATION DOSE REDUCTION: This exam was performed according to the departmental dose-optimization program which includes automated exposure control, adjustment of the mA and/or kV according to patient size and/or use of iterative reconstruction technique. COMPARISON:  Head CT 08/28/2021 FINDINGS: Brain:  No evidence of acute intracranial hemorrhage or extra-axial collection.Focal small area of encephalomalacia in the high convexity right parietal lobe compatible with prior small cortical infarct.The ventricles are unchanged in size.Scattered subcortical and periventricular white matter hypodensities, nonspecific but likely sequela of chronic small vessel ischemic disease.Mild cerebral atrophy Vascular: No hyperdense vessel.  Vascular calcifications. Skull: Negative for skull fracture. Sinuses/Orbits: Mild ethmoid air cell mucosal thickening. Mastoid air cells are clear. Orbits are unremarkable. Other: None. IMPRESSION: No acute intracranial abnormality. Mild sequela of chronic small vessel ischemic disease. Electronically Signed   By: Maurine Simmering  M.D.   On: 04/15/2022 16:38   DG Chest Port 1 View  Result Date: 04/15/2022 CLINICAL DATA:  Syncope. EXAM: PORTABLE CHEST 1 VIEW COMPARISON:  10/19/2021 and CT chest 08/28/2021. FINDINGS: Patient is rotated. Trachea is midline. Right IJ dialysis catheter tip is in the low right atrium. Heart size stable. Thoracic aorta is calcified. Lungs are low in volume with an elevated right hemidiaphragm. Bibasilar streaky atelectasis, left greater than right. IMPRESSION: Bibasilar streaky atelectasis, left greater than right Electronically Signed   By: Lorin Picket M.D.   On: 04/15/2022 14:30    Procedures .Central Line  Date/Time: 04/15/2022 4:57 PM  Performed by: Godfrey Pick, MD Authorized by: Godfrey Pick, MD   Consent:    Consent obtained:  Emergent situation Pre-procedure details:    Indication(s): central venous access and insufficient peripheral access     Hand hygiene: Hand hygiene performed prior to insertion     Sterile barrier technique: All elements of maximal sterile technique followed     Skin preparation:  Chlorhexidine Sedation:    Sedation type:  None Anesthesia:    Anesthesia method:  Local infiltration   Local anesthetic:  Lidocaine 1% w/o  epi Procedure details:    Location:  L internal jugular   Patient position:  Trendelenburg   Procedural supplies:  Triple lumen   Catheter size:  7 Fr   Landmarks identified: yes     Ultrasound guidance: yes     Ultrasound guidance timing: real time     Sterile ultrasound techniques: Sterile gel and sterile probe covers were used     Number of attempts:  1   Successful placement: yes   Post-procedure details:    Post-procedure:  Dressing applied and line sutured   Assessment:  Blood return through all ports   Procedure completion:  Tolerated well, no immediate complications     Medications Ordered in ED Medications  norepinephrine (LEVOPHED) 4mg  in 253mL (0.016 mg/mL) premix infusion (5 mcg/min Intravenous New Bag/Given 04/15/22 1526)  lactated ringers infusion ( Intravenous New Bag/Given 04/15/22 1527)  vancomycin (VANCOCIN) IVPB 1000 mg/200 mL premix (has no administration in time range)  LORazepam (ATIVAN) injection 1 mg (has no administration in time range)  lactated ringers bolus 500 mL (has no administration in time range)  lactated ringers bolus 250 mL (0 mLs Intravenous Stopped 04/15/22 1526)  lactated ringers bolus 250 mL (0 mLs Intravenous Stopped 04/15/22 1528)  ceFEPIme (MAXIPIME) 2 g in sodium chloride 0.9 % 100 mL IVPB (2 g Intravenous New Bag/Given 04/15/22 1548)  metroNIDAZOLE (FLAGYL) IVPB 500 mg (500 mg Intravenous New Bag/Given 04/15/22 1549)    ED Course/ Medical Decision Making/ A&P                           Medical Decision Making Amount and/or Complexity of Data Reviewed Labs: ordered. Radiology: ordered.  Risk Prescription drug management.   This patient presents to the ED for concern of loss of consciousness, this involves an extensive number of treatment options, and is a complaint that carries with it a high risk of complications and morbidity.  The differential diagnosis includes dialysis fluid shifts, sepsis, cardiogenic shock   Co morbidities  that complicate the patient evaluation  T2DM, right foot diabetic ulcer s/p BKA, ESRD, and dementia   Additional history obtained:  Additional history obtained from EMS External records from outside source obtained and reviewed including EMR   Lab Tests:  I Ordered, and personally  interpreted labs.  The pertinent results include: Leukocytosis and lactic acidosis are present raising concern for septic shock.  Patient has mild hypokalemia.  Troponin is normal.   Imaging Studies ordered:  I ordered imaging studies including chest x-ray, CT head, CT C/A/P I independently visualized and interpreted imaging which showed evidence of pneumonia as well as a thickened bladder wall I agree with the radiologist interpretation   Cardiac Monitoring: / EKG:  The patient was maintained on a cardiac monitor.  I personally viewed and interpreted the cardiac monitored which showed an underlying rhythm of: Indeterminate rhythm   Problem List / ED Course / Critical interventions / Medication management  Patient is a 76 year old female with history of dementia, presenting from dialysis center for hypotension and loss of consciousness.  Patient is unable to provide any history.  History is provided only by EMS.  EMS reports that when they arrived on scene, she was awake but lethargic.  She did have some improved mentation during transit.  She was able to answer questions and was aware of her situation.  Vital signs prior to arrival are notable for hypotension and hypoxia.  She was placed on a nonrebreather.  No IV access was able to be obtained prior to arrival.  On arrival, patient is ill-appearing.  She receives dialysis through a right chest port.  She has a recent fistula that was placed on her left forearm.  1 peripheral IV was able to be placed.  IV fluids were ordered.  Patient was found to have continued hypotension.  This necessitated a central line placement.  Laboratory work-up was notable for  leukocytosis and lactic acidosis.  Patient was given broad-spectrum antibiotics for treatment of septic shock.  Levophed was initiated.  CT imaging showed evidence of pneumonia as well as a thickened bladder wall.  Patient will require ICU admission.  Care of patient was signed out to oncoming ED provider. I ordered medication including IV fluids, broad-spectrum antibiotics, and Levophed for septic shock Reevaluation of the patient after these medicines showed that the patient improved I have reviewed the patients home medicines and have made adjustments as needed   Social Determinants of Health:  Has dementia  CRITICAL CARE Performed by: Godfrey Pick   Total critical care time: 40 minutes  Critical care time was exclusive of separately billable procedures and treating other patients.  Critical care was necessary to treat or prevent imminent or life-threatening deterioration.  Critical care was time spent personally by me on the following activities: development of treatment plan with patient and/or surrogate as well as nursing, discussions with consultants, evaluation of patient's response to treatment, examination of patient, obtaining history from patient or surrogate, ordering and performing treatments and interventions, ordering and review of laboratory studies, ordering and review of radiographic studies, pulse oximetry and re-evaluation of patient's condition.         Final Clinical Impression(s) / ED Diagnoses Final diagnoses:  Sepsis, due to unspecified organism, unspecified whether acute organ dysfunction present [facility])  Multifocal pneumonia  Septic shock [facility])    Rx / DC Orders ED Discharge Orders     None         Godfrey Pick, MD 04/15/22 1659

## 2022-04-15 NOTE — ED Notes (Signed)
X-ray at bedside

## 2022-04-15 NOTE — H&P (Signed)
NAME:  Molly Arroyo, MRN:  [id number], DOB:  06-19-1946, LOS: 0 ADMISSION DATE:  04/15/2022, CONSULTATION DATE:  04/15/2022 REFERRING MD:  Dr. Rogene Houston, CHIEF COMPLAINT:  Sepsis   History of Present Illness:  Molly Arroyo is a 76 y.o. 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  On EMS arrival patient was awake and alert but continued with hypotension prompting transfer to the ED.   On ED presentation patient was seen profoundly hypotensive with blood pressure 79/40 and mildly hypothermic with temp 96.6.  Lab work significant for sodium 134, potassium 3, creatinine 2.15, albumin less than 1.5 total protein 4.6, lactic acid 2.8, WBC 18.2, hemoglobin 10.7, INR 1.4.  CT chest obtained and revealed right lower lobe and right middle lobe pneumonia versus.  Head CT negative.  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 rate (!) 7, SpO2 99 %.       No intake or output data in the 24 hours ending 04/15/22 1715 There were no vitals filed for this visit.  Examination: General: Acute on chronically ill appearing elderly female  , lying in bed, in NAD HEENT: [place]/AT, MM pink/moist, PERRL,  Neuro: Minimally responsive on assessment of note per RN patient received Ativan 10 mins prior to my assessment, will withdrawal to pain  CV: s1s2 regular rate and rhythm, no murmur, rubs, or gallops,  PULM:  Slightly diminished but no added breath sounds, no increased work of breathing  GI: soft, bowel sounds active in all 4 quadrants, non-tender, non-distended Extremities: warm/dry, no edema  Skin: no rashes or lesions  Resolved Hospital Problem list     Assessment & Plan:  Sever sepsis -Patient presented with hypotension, hypothermia, leukocytosis, with elevated lactic acid and acute concern for infection  -Source likely multifocal pneumonia vs bacteremia from infected HD line   P: Admit ICU Continue supplemental oxygen for sat goal > 92 Continue IV Cefepime and Vancomycin  Aggressive IV hydration provided on admit  Continue pressors for MAP< 65  Trend lactic acid Monitor urine output Capillary refill:  ESRD on HD Monday and Friday -Received 1 and 1/2 hrs of iHD prior to admission 7/24 P: Notify nephrology of patient admission tomorrow morning No acute indications for HD currently   Follow renal function  Trend Bmet Avoid nephrotoxins Ensure adequate renal perfusion   Type 2 diabetes -Home medications includes Tradjenta S/p BKA secondary to diabetic foot ulcer  P: CBG checks q4 Start SSI if needed   Hx of HTN/HLD  -Home medications includes Norvasc, Plavix, Lasix, Paxil  P: Hold home medications acutely, resume when appropriate   Hypokalemia  P: Trend Bmet  Supplement as needed   Hypoalonemia  Protein calorie malnutrition P: SLP  and dietary eval  Supplement protein   Best Practice (right click and "Reselect all SmartList Selections" daily)   Diet/type: NPO DVT prophylaxis: prophylactic heparin  GI prophylaxis: PPI Lines: Central line Foley:  N/A Code Status:  full code Last date of  multidisciplinary goals of care discussion: No family at bedside   Labs   CBC: Recent Labs  Lab 04/15/22 1412 04/15/22 1427  WBC 18.2*  --   NEUTROABS 16.7*  --   HGB 10.7* 11.9*  HCT 33.0* 35.0*  MCV 96.8  --   PLT 155  --     Basic Metabolic Panel: Recent Labs  Lab 04/15/22 1412 04/15/22 1427  NA 134* 134*  K 3.0* 2.9*  CL 105 101  CO2 22  --   GLUCOSE 107* 99  BUN 13 16  CREATININE 2.15* 2.10*  CALCIUM 7.2*  --   MG 1.8  --    GFR: CrCl cannot be calculated (Unknown ideal weight.). Recent Labs  Lab 04/15/22 1412 04/15/22 1413  WBC 18.2*  --   LATICACIDVEN  --  2.8*    Liver Function Tests: Recent Labs  Lab 04/15/22 1412  AST 24  ALT 7  ALKPHOS 98  BILITOT 0.7  PROT 4.6*  ALBUMIN <1.5*   No results for input(s): "LIPASE", "AMYLASE" in the last 168 hours. No results for input(s): "AMMONIA" in the last 168 hours.  ABG    Component Value Date/Time   PHART 7.152 (LL) 02/11/2021 2310   PCO2ART 32.2 02/11/2021 2310   PO2ART 72 (L) 02/11/2021 2310   HCO3 12.6 (L) 02/12/2021 0651   TCO2 23 04/15/2022 1427   ACIDBASEDEF 14.3 (H) 02/12/2021 0651   O2SAT 57.7 02/12/2021 0651     Coagulation Profile: Recent Labs  Lab 04/15/22 1418  INR 1.4*    Cardiac Enzymes: No results for input(s): "CKTOTAL", "CKMB", "CKMBINDEX", "TROPONINI" in the last 168 hours.  HbA1C: Hgb A1c MFr Bld  Date/Time Value Ref Range Status  02/11/2021 04:40 PM 6.8 (H) 4.8 - 5.6 % Final    Comment:    (NOTE) Pre diabetes:          5.7%-6.4%  Diabetes:              >6.4%  Glycemic control for   <7.0% adults with diabetes     CBG: No results for input(s): "GLUCAP" in the last 168 hours.  Review of Systems:   Unable to assess   Past Medical History:  She,  has a past medical history of Chronic kidney disease, Diabetes mellitus without complication ([place]), Hypertension, Hypothyroidism, and Thyroid disease.   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 She has never used smokeless tobacco. She reports that she does not drink alcohol and does not use drugs.   Family History:  Her family history is not on file.   Allergies No Known Allergies   Home Medications  Prior to Admission medications   Medication Sig Start Date End Date Taking? Authorizing Provider  acetaminophen (TYLENOL) 325 MG tablet Take 2 tablets (650 mg total) by mouth every 6 (six) hours as needed for mild pain, moderate pain or headache (  or Fever >/= 101). 02/14/21   Jeralyn Bennett, MD  Amino Acids-Protein Hydrolys (FEEDING SUPPLEMENT, PRO-STAT 64,) LIQD Take 30 mLs by mouth 2 (two) times daily. For wound healing    [provider]  amLODipine (NORVASC) 10 MG tablet Take 1 tablet (10 mg total) by mouth daily. 02/14/21   Jeralyn Bennett, MD  busPIRone (BUSPAR) 10 MG tablet Take 15 mg by mouth 3 (three) times daily. 07/18/21   [provider]  clopidogrel (PLAVIX) 75 MG tablet Take 75 mg by mouth daily. 01/18/21   [provider]  furosemide (LASIX) 20 MG tablet Take 1 tablet (20 mg total) by mouth daily as needed for edema (for weight gain >3 lbs in 1 day of >5lbs in 1 week or worsening lower extremity swelling / shortness of breath laying down). Patient taking differently: Take 20 mg by mouth daily. 02/14/21   Jeralyn Bennett, MD  HYDROcodone-acetaminophen (NORCO) 5-325 MG tablet Take 1 tablet by mouth every 6 (six) hours as needed for moderate pain. 03/13/22   Dagoberto Ligas, PA-C  HYDROmorphone (DILAUDID) 4 MG tablet Take 4 mg by mouth every 8 (eight) hours as needed for pain. 09/21/21   [provider]   Nutritional Supplements (NUTRITIONAL SUPPLEMENT PO) Take 237 mLs by mouth 2 (two) times daily. House shake    [provider]  PARoxetine (PAXIL) 40 MG tablet Take 40 mg by mouth daily. 03/08/22   [provider]  TRADJENTA 5 MG TABS tablet Take 5 mg by mouth daily. 03/05/22   [provider]  vitamin B-12 (CYANOCOBALAMIN) 500 MCG tablet Take 500 mcg by mouth daily. 03/20/21   [provider]     Critical care time: 29mins  Floella Ensz D. Kenton Kingfisher, NP-C [facility] Personal contact information can be found on Amion  04/15/2022, 6:14 PM

## 2022-04-15 NOTE — ED Notes (Signed)
Patient transported to CT 

## 2022-04-15 NOTE — ED Triage Notes (Signed)
Pt BIB [facility] from dialysis center d/t hypotension & a brief moment of LOC. She does have Hx of dementia, GCS of 14 upon arrival, 84/74 BP, CBG 208, no PIV access upon arrival.

## 2022-04-15 NOTE — ED Notes (Signed)
EDP [place] at bedside inserting central line.

## 2022-04-15 NOTE — ED Provider Notes (Signed)
CT scan shows multifocal pneumonia probably the source of the sepsis.  Patient on norepinephrine drip.  Does have a central line left IJ area.  There is titrating norepinephrine but still hypotensive.  Patient did not have dialysis today but only did an hour out of her normal 3-hour dialysis course.  Patient is from a nursing facility in [place].  Patient is a full code.  Discussed with critical care who will come and see the patient.   Fredia Sorrow, MD 04/15/22 [phone]

## 2022-04-16 ENCOUNTER — Encounter (HOSPITAL_COMMUNITY): Payer: Self-pay | Admitting: Pulmonary Disease

## 2022-04-16 ENCOUNTER — Encounter (HOSPITAL_COMMUNITY): Payer: Medicare (Managed Care)

## 2022-04-16 ENCOUNTER — Encounter: Payer: Medicare (Managed Care) | Admitting: Vascular Surgery

## 2022-04-16 DIAGNOSIS — A419 Sepsis, unspecified organism: Secondary | ICD-10-CM | POA: Diagnosis not present

## 2022-04-16 DIAGNOSIS — L8990 Pressure ulcer of unspecified site, unspecified stage: Secondary | ICD-10-CM | POA: Insufficient documentation

## 2022-04-16 LAB — CBC
HCT: 28.8 % — ABNORMAL LOW (ref 36.0–46.0)
Hemoglobin: 10 g/dL — ABNORMAL LOW (ref 12.0–15.0)
MCH: 31.1 pg (ref 26.0–34.0)
MCHC: 34.7 g/dL (ref 30.0–36.0)
MCV: 89.4 fL (ref 80.0–100.0)
Platelets: 185 10*3/uL (ref 150–400)
RBC: 3.22 MIL/uL — ABNORMAL LOW (ref 3.87–5.11)
RDW: 17.6 % — ABNORMAL HIGH (ref 11.5–15.5)
WBC: 21.9 10*3/uL — ABNORMAL HIGH (ref 4.0–10.5)
nRBC: 0 % (ref 0.0–0.2)

## 2022-04-16 LAB — GLUCOSE, CAPILLARY
Glucose-Capillary: 195 mg/dL — ABNORMAL HIGH (ref 70–99)
Glucose-Capillary: 197 mg/dL — ABNORMAL HIGH (ref 70–99)
Glucose-Capillary: 176 mg/dL — ABNORMAL HIGH (ref 70–99)
Glucose-Capillary: 242 mg/dL — ABNORMAL HIGH (ref 70–99)
Glucose-Capillary: 237 mg/dL — ABNORMAL HIGH (ref 70–99)
Glucose-Capillary: 123 mg/dL — ABNORMAL HIGH (ref 70–99)

## 2022-04-16 LAB — BASIC METABOLIC PANEL
Anion gap: 6 (ref 5–15)
BUN: 18 mg/dL (ref 8–23)
CO2: 21 mmol/L — ABNORMAL LOW (ref 22–32)
Calcium: 7.1 mg/dL — ABNORMAL LOW (ref 8.9–10.3)
Chloride: 99 mmol/L (ref 98–111)
Creatinine, Ser: 2.32 mg/dL — ABNORMAL HIGH (ref 0.44–1.00)
GFR, Estimated: 21 mL/min — ABNORMAL LOW (ref >60)
Glucose, Bld: 222 mg/dL — ABNORMAL HIGH (ref 70–99)
Potassium: 3.7 mmol/L (ref 3.5–5.1)
Sodium: 126 mmol/L — ABNORMAL LOW (ref 135–145)

## 2022-04-16 LAB — MAGNESIUM: Magnesium: 1.5 mg/dL — ABNORMAL LOW (ref 1.7–2.4)

## 2022-04-16 LAB — HEMOGLOBIN A1C
Hgb A1c MFr Bld: 4.3 % — ABNORMAL LOW (ref 4.8–5.6)
Mean Plasma Glucose: 76.71 mg/dL

## 2022-04-16 LAB — PHOSPHORUS: Phosphorus: 2.6 mg/dL (ref 2.5–4.6)

## 2022-04-16 MED ORDER — NOREPINEPHRINE 16 MG/250ML-% IV SOLN
0.0000 ug/min | INTRAVENOUS | Status: DC
  Administered 2022-04-16: 14 ug/min via INTRAVENOUS
  Filled 2022-04-16: qty 250

## 2022-04-16 MED ORDER — INSULIN ASPART 100 UNIT/ML IJ SOLN
0.0000 [IU] | INTRAMUSCULAR | Status: DC
  Administered 2022-04-16 (×2): 2 [IU] via SUBCUTANEOUS
  Administered 2022-04-16: 1 [IU] via SUBCUTANEOUS

## 2022-04-16 MED ORDER — CALCIUM GLUCONATE-NACL 1-0.675 GM/50ML-% IV SOLN
1.0000 g | Freq: Once | INTRAVENOUS | Status: AC
  Administered 2022-04-16: 1000 mg via INTRAVENOUS
  Filled 2022-04-16: qty 50

## 2022-04-16 MED ORDER — ONDANSETRON HCL 4 MG/2ML IJ SOLN
4.0000 mg | Freq: Four times a day (QID) | INTRAMUSCULAR | Status: DC | PRN
  Administered 2022-04-16 – 2022-04-22 (×6): 4 mg via INTRAVENOUS
  Filled 2022-04-16 (×5): qty 2

## 2022-04-16 MED ORDER — GERHARDT'S BUTT CREAM
TOPICAL_CREAM | Freq: Four times a day (QID) | CUTANEOUS | Status: DC
Start: 2022-04-16 — End: 2022-04-27
  Administered 2022-04-24: 1 via TOPICAL
  Filled 2022-04-16 (×3): qty 1

## 2022-04-16 MED ORDER — MAGNESIUM SULFATE 4 GM/100ML IV SOLN
4.0000 g | Freq: Once | INTRAVENOUS | Status: AC
Start: 2022-04-16 — End: 2022-04-16
  Administered 2022-04-16: 4 g via INTRAVENOUS
  Filled 2022-04-16: qty 100

## 2022-04-16 MED ORDER — ONDANSETRON HCL 4 MG/2ML IJ SOLN
INTRAMUSCULAR | Status: AC
  Filled 2022-04-16: qty 2

## 2022-04-16 MED ORDER — TRAZODONE HCL 50 MG PO TABS
50.0000 mg | ORAL_TABLET | Freq: Every day | ORAL | Status: DC
Start: 2022-04-17 — End: 2022-04-19
  Administered 2022-04-18: 50 mg via ORAL
  Filled 2022-04-16: qty 1

## 2022-04-16 MED ORDER — CHLORHEXIDINE GLUCONATE CLOTH 2 % EX PADS
6.0000 | MEDICATED_PAD | Freq: Every day | CUTANEOUS | Status: DC
  Administered 2022-04-17 – 2022-04-24 (×8): 6 via TOPICAL

## 2022-04-16 MED ORDER — VANCOMYCIN VARIABLE DOSE PER UNSTABLE RENAL FUNCTION (PHARMACIST DOSING): Status: DC

## 2022-04-16 NOTE — Consult Note (Addendum)
WOC Nurse Consult Note: Reason for Consult: chronic, nonhealing ulcer to left lateral foot, Stage 2 PI to coccyx, moisture associated skin damage, irritant contact dermatitis due to urinary incontinence.  Wound type:PAD, ICD ICD-10 CM Codes for Irritant Dermatitis L24A2 - Due to fecal, urinary or dual incontinence   Pressure Injury POA: Yes Measurement: 2cm x 2.5cm x 0.2cm Wound DXI:PJAS, moist Drainage (amount, consistency, odor) scant serous Periwound:macerated Dressing procedure/placement/frequency: Turning and repositioning is in place, patient is on a mattress replacement with low air loss feature as she is in ICU. I will continue this post transfer to floor. The left foot ulceration will be painted twice daily with a betadine (povidone iodine) swabstick and allowed to air dry, then the foot placed into a pressure redistribution heel boot. The patient is using a urinary incontinence external management device with suction (PurWick).Gerhart's Butt Cream (a compounded 1:1:1 product consisting of hydrocortisone cream, lotrimin cream and zinc oxide will be used to the MASD affected areas (buttocks, inguinal, perineal, posterior and medial thighs) 4 times daily and PRN soiling.  [place] nursing team will not follow, but will remain available to this patient, the nursing and medical teams.  Please re-consult if needed.  Thank you for inviting [place] to participate in this patient's Plan of Care.  Maudie Flakes, MSN, RN, CNS, [place], Serita Grammes, [employer], [employer] phone:  [phone]

## 2022-04-16 NOTE — Consult Note (Addendum)
Hospital Consult  VASCULAR SURGERY ASSESSMENT & PLAN:   CELLULITIS LEFT UPPER ARM: This patient had a first stage basilic vein transposition on 03/13/2022.  She was admitted with sepsis.  She had some swelling at the incision and some mild cellulitis and vascular surgery was consulted to see if this might potentially be a source for sepsis.  On exam she has mild cellulitis with some soft tissue swelling.  There is no prosthetic material and I think it is very unlikely that this would be a source for sepsis.  She currently is on Maxipime, Flagyl, and vancomycin.  Vascular surgery will follow.  Molly Gallop, MD 2:00 PM   Reason for Consult:  left arm skin tears Requesting Physician:  Juanito Doom, MD MRN #:  [id number]  History of Present Illness: Molly Arroyo is a 76 y.o. female with a PMH of DM2, hypertension, hypothyroidism, status post BKA secondary to diabetic foot ulcer, ESRD on HD MWF.  Patient presented to the ED on 04/15/2022 after becoming minimally responsive with hypotension during HD.  CT chest revealed right lower lobe and right middle lobe pneumonia.  Patient was admitted to ICU requiring pressor support due to severe sepsis secondary to multifocal pneumonia vs bacteremia from infected HD line.  Patient was placed on pressors, IV cefepime and vancomycin.  We were consulted due to skin tears found on patient's left forearm.  Patient has skin tear over left forearm that has been present for an unknown period of time.  The patient does not know how she obtained the skin tear.  She endorses some bleeding from the area.  She denies any discharge from the area.  The patient was last seen by [place] on 03/13/2022 for outpatient for first stage brachiobasilic fistula of the left arm by Dr. Carlis Abbott.  The patient's left upper arm incision is well-healed.  The patient denies any issues with this incision site.  She denies any bleeding or discharge from the area.  She denies any pain from the  area.  She is still receiving dialysis on MWF via right IJ tunneled catheter.  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 Authorizing Provider  acetaminophen (TYLENOL) 325 MG tablet Take 2 tablets (650 mg total) by mouth every 6 (six) hours as needed for mild pain, moderate pain or headache (or Fever >/= 101). Patient taking differently: Take 325 mg by mouth every 6 (six) hours as needed (pain). 02/14/21  Yes Jeralyn Bennett, MD  amLODipine (NORVASC) 10 MG tablet Take 1 tablet (10 mg total) by mouth daily. 02/14/21  Yes Jeralyn Bennett, MD  atenolol (TENORMIN) 50 MG tablet Take 25 mg by mouth daily.   Yes [provider]  B Complex-C-Folic Acid (RENA-VITE PO) Take 1 tablet by mouth daily.   Yes [provider]  busPIRone (BUSPAR) 10 MG tablet Take 15 mg by mouth 3 (three) times daily. 07/18/21  Yes [provider]  Cholecalciferol (VITAMIN D3) 1.25 MG (50000 UT) CAPS Take 1 capsule by mouth every Sunday.   Yes [provider]  clopidogrel (PLAVIX) 75 MG tablet Take 75 mg by mouth daily. 01/18/21  Yes [provider]  Dextrose, Diabetic Use, (INSTA-GLUCOSE) 77.4 % GEL Take 1 Dose by mouth as needed (BG < 70).   Yes [provider]  furosemide (LASIX) 20 MG tablet Take 1 tablet (20 mg total) by mouth daily as needed for edema (for weight gain >3 lbs in 1 day of >5lbs in 1 week or worsening lower  extremity swelling / shortness of breath laying down). Patient taking differently: Take 20 mg by mouth daily. 02/14/21  Yes Jeralyn Bennett, MD  Glucagon, rDNA, (GLUCAGON EMERGENCY) 1 MG KIT Inject 1 mg into the muscle as needed (BG < 70).   Yes [provider]  HYDROcodone-acetaminophen (NORCO) 5-325 MG tablet Take 1 tablet by mouth every 6 (six) hours as needed for moderate pain. 03/13/22  Yes Dagoberto Ligas, PA-C  Nutritional Supplements (NUTRITIONAL DRINK PO) Take 1 Dose by mouth 2 (two) times daily. House supplement   Yes [provider]  Nutritional Supplements (NUTRITIONAL DRINK PO) Take 1 Dose by mouth in the morning and at bedtime. Protein liquid   Yes [provider]  ondansetron (ZOFRAN) 4 MG tablet Take 4 mg by mouth every 6 (six) hours as needed for nausea or vomiting.   Yes [provider]  PARoxetine (PAXIL) 40 MG tablet Take 40 mg by mouth daily. 03/08/22  Yes [provider]  rOPINIRole (REQUIP) 1 MG tablet Take 1 mg by mouth at bedtime.   Yes [provider]  sevelamer carbonate (RENVELA) 800 MG tablet Take 800 mg by mouth 3 (three) times daily with meals.   Yes [provider]  TRADJENTA 5 MG TABS tablet Take 5 mg by mouth daily. 03/05/22  Yes [provider]  traZODone (DESYREL) 50 MG tablet Take 50 mg by mouth at bedtime.   Yes [provider]  vitamin B-12 (CYANOCOBALAMIN) 500 MCG tablet Take 500 mcg by mouth daily. 03/20/21  Yes [provider]  Zinc 50 MG TABS Take 50 mg by mouth daily.   Yes [provider]    Social History   Socioeconomic History   Marital status: Divorced    Spouse name: Not on file   Number of children: Not on file   Years of education: Not on file   Highest education level: Not on file  Occupational History   Not on file  Tobacco Use   Smoking status: Never   Smokeless tobacco: Never  Vaping Use   Vaping Use: Never used  Substance and Sexual  Activity   Alcohol use: Never   Drug use: Never   Sexual activity: Not Currently    Birth control/protection: Post-menopausal  Other Topics Concern   Not on file  Social History Narrative   Not on file   Social Determinants of Health   Financial Resource Strain: Not on file  Food Insecurity: Not on file  Transportation Needs: Not on file  Physical Activity: Not on file  Stress: Not on file  Social Connections: Not on file  Intimate Partner Violence: Not on file    No family history on file.  ROS: Otherwise negative unless mentioned in HPI  Physical Examination  Vitals:   04/16/22 1200 04/16/22 1215  BP: 119/65 109/72  Pulse: 78 77  Resp: 19 19  Temp:  SpO2: 99% 98%   Body mass index is 18.19 kg/m.  General:  WDWN in NAD Gait: Not observed HENT: WNL, normocephalic Pulmonary: normal non-labored breathing, without Rales, rhonchi,  wheezing Cardiac: regular, without murmurs, rubs or gallops; without carotid bruits Abdomen:  soft, NT/ND, no masses Skin: without rashes. Skin breakdown over left forearm, dressed with gauze. Vascular Exam/Pulses: Weak but palpable left radial pulse, unchanged from previous visit. Left bicep incision over fistula is well healed with no signs of infection or incisional dehiscence. Palpable thrill of left upper arm fistula. Extremities: without ischemic changes, without Gangrene , without cellulitis; with open wounds; 1/2 inch shallow skin tear over left forearm, no discharge from area or surrounding erythema. Some forearm edema. No sign of hematoma. Musculoskeletal: no muscle wasting or atrophy  Neurologic: A&O X 3;  No focal weakness or paresthesias are detected; speech is fluent/normal Psychiatric:  The pt has Normal affect. Lymph:  Unremarkable     CBC    Component Value Date/Time   WBC 21.9 (H) 04/16/2022 0506   RBC 3.22 (L) 04/16/2022 0506   HGB 10.0 (L) 04/16/2022 0506   HCT 28.8 (L) 04/16/2022 0506   PLT 185 04/16/2022  0506   MCV 89.4 04/16/2022 0506   MCH 31.1 04/16/2022 0506   MCHC 34.7 04/16/2022 0506   RDW 17.6 (H) 04/16/2022 0506   LYMPHSABS 0.7 04/15/2022 1412   MONOABS 0.6 04/15/2022 1412   EOSABS 0.1 04/15/2022 1412   BASOSABS 0.1 04/15/2022 1412    BMET    Component Value Date/Time   NA 126 (L) 04/16/2022 0506   K 3.7 04/16/2022 0506   CL 99 04/16/2022 0506   CO2 21 (L) 04/16/2022 0506   GLUCOSE 222 (H) 04/16/2022 0506   BUN 18 04/16/2022 0506   CREATININE 2.32 (H) 04/16/2022 0506   CALCIUM 7.1 (L) 04/16/2022 0506   GFRNONAA 21 (L) 04/16/2022 0506    COAGS: Lab Results  Component Value Date   INR 1.4 (H) 04/15/2022     Non-Invasive Vascular Imaging:   none  Statin:  No. Beta Blocker:  Yes.   Aspirin:  No. ACEI:  No. ARB:  No. CCB use:  Yes Other antiplatelets/anticoagulants:  Yes.   Plavix   ASSESSMENT/PLAN: This is a 76 y.o. female being evaluated for skin tears over left forearm, in the setting of left 1st stage basilic fistula creation on 03/13/2022 by [facility]   -Skin tear over left forearm, no signs of infection. Skin tear does not involve left upper arm fistula surgical site. Surgical site of left upper arm is intact with no sign of infection, skin compromise, or hematoma. -Left upper arm fistula with good thrill -Palpable left radial pulse -No interventions necessary at this time.  -Defer left forearm wound care to wound care nurse  Vicente Serene PA-C [facility] [phone]

## 2022-04-16 NOTE — Consult Note (Signed)
Renal Service Consult Note [facility]  Molly Arroyo 04/16/2022 Sol Blazing, MD Requesting Physician: Dr. Lake Bells  Reason for Consult: ESRD pt w/ sepsis HPI: The patient is a 76 y.o. year-old w/ hx of HTN, DM2, ESRD on HD, hypothyroidism, R BKA who presented to ED on 7/24 sent from OP HD due to hypotension and LOC/ AMS. Hx of dementia. In ED BP 84/74, pt got IV fluid bolus. Hypothermic w/ temp 96.6. Na 134, K 3, creat 2.1, alb < 1.5, LA 2.8, WBC 18k  Hb 10.7 . CT chest showed RML/ RLL pna. HCT negative. Pt was started on pressor supports and was admitted to ICU by PCCM. We are asked to see for dialysis.   Pt seen in ICU room. Pt is interacting and Ox 3.  No CP or Cough, no abd pain. Lives in [place], takes the bus to HD. States she lives w/ her dtr, but ICU staff says she lives in a SNF.    ROS - denies CP, no joint pain, no HA, no blurry vision, no rash, no diarrhea, no nausea/ vomiting, no dysuria, no difficulty voiding   Past Medical History  Past Medical History:  Diagnosis Date   Chronic kidney disease    on dialysis   Diabetes mellitus without complication ([place])    Hypertension    Hypothyroidism    Thyroid disease    Past Surgical History  Past Surgical History:  Procedure Laterality Date   AV FISTULA PLACEMENT Left 03/13/2022   Procedure: INSERTION OF LEFT ARM ARTERIOVENOUS (AV) BRACHIOBASILIC FISTULA;  Surgeon: Marty Heck, MD;  Location: [facility] OR;  Service: Vascular;  Laterality: Left;   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 Social History  reports that she has never smoked. She has never used smokeless tobacco. She reports that she does not drink alcohol and does not use drugs. Allergies No Known Allergies Home medications Prior to Admission medications   Medication Sig Start Date End Date Taking? Authorizing Provider  acetaminophen (TYLENOL) 325 MG tablet Take 2 tablets (650 mg total) by  mouth every 6 (six) hours as needed for mild pain, moderate pain or headache (or Fever >/= 101). Patient taking differently: Take 325 mg by mouth every 6 (six) hours as needed (pain). 02/14/21  Yes Jeralyn Bennett, MD  amLODipine (NORVASC) 10 MG tablet Take 1 tablet (10 mg total) by mouth daily. 02/14/21  Yes Jeralyn Bennett, MD  atenolol (TENORMIN) 50 MG tablet Take 25 mg by mouth daily.   Yes [provider]  B Complex-C-Folic Acid (RENA-VITE PO) Take 1 tablet by mouth daily.   Yes [provider]  busPIRone (BUSPAR) 10 MG tablet Take 15 mg by mouth 3 (three) times daily. 07/18/21  Yes [provider]  Cholecalciferol (VITAMIN D3) 1.25 MG (50000 UT) CAPS Take 1 capsule by mouth every Sunday.   Yes [provider]  clopidogrel (PLAVIX) 75 MG tablet Take 75 mg by mouth daily. 01/18/21  Yes [provider]  Dextrose, Diabetic Use, (INSTA-GLUCOSE) 77.4 % GEL Take 1 Dose by mouth as needed (BG < 70).   Yes [provider]  furosemide (LASIX) 20 MG tablet Take 1 tablet (20 mg total) by mouth daily as needed for edema (for weight gain >3 lbs in 1 day of >5lbs in 1 week or worsening lower extremity swelling / shortness of breath laying down). Patient taking differently: Take 20 mg by mouth daily. 02/14/21  Yes  Jeralyn Bennett, MD  Glucagon, rDNA, (GLUCAGON EMERGENCY) 1 MG KIT Inject 1 mg into the muscle as needed (BG < 70).   Yes [provider]  HYDROcodone-acetaminophen (NORCO) 5-325 MG tablet Take 1 tablet by mouth every 6 (six) hours as needed for moderate pain. 03/13/22  Yes Dagoberto Ligas, PA-C  Nutritional Supplements (NUTRITIONAL DRINK PO) Take 1 Dose by mouth 2 (two) times daily. House supplement   Yes [provider]  Nutritional Supplements (NUTRITIONAL DRINK PO) Take 1 Dose by mouth in the morning and at bedtime. Protein liquid   Yes [provider]  ondansetron (ZOFRAN) 4 MG tablet Take 4 mg by mouth every 6 (six)  hours as needed for nausea or vomiting.   Yes [provider]  PARoxetine (PAXIL) 40 MG tablet Take 40 mg by mouth daily. 03/08/22  Yes [provider]  rOPINIRole (REQUIP) 1 MG tablet Take 1 mg by mouth at bedtime.   Yes [provider]  sevelamer carbonate (RENVELA) 800 MG tablet Take 800 mg by mouth 3 (three) times daily with meals.   Yes [provider]  TRADJENTA 5 MG TABS tablet Take 5 mg by mouth daily. 03/05/22  Yes [provider]  traZODone (DESYREL) 50 MG tablet Take 50 mg by mouth at bedtime.   Yes [provider]  vitamin B-12 (CYANOCOBALAMIN) 500 MCG tablet Take 500 mcg by mouth daily. 03/20/21  Yes [provider]  Zinc 50 MG TABS Take 50 mg by mouth daily.   Yes [provider]     Vitals:   04/16/22 1015 04/16/22 1030 04/16/22 1045 04/16/22 1124  BP: 117/60 96/70 97/62    Pulse: 74 79 76   Resp: 18 19 18    Temp:    97.9 F (36.6 C)  TempSrc:    Axillary  SpO2: 100% 98% 96%   Weight:       Exam Gen alert, no distress, eldelry, frail and chronically ill appearing No rash, cyanosis or gangrene Sclera anicteric, throat clear  No jvd or bruits Chest clear bilat to bases, no rales/ wheezing RRR no MRG Abd soft ntnd no mass or ascites +bs GU normal MS L forearm is wrapped, +bruit over AVF Ext no LE edema, 1+ bilat UE edema Neuro is alert, Ox 3 , nf    RIJ TDC intact, L AVF +bruit      Home meds include - amlodipine 10, atenolol 25 qd, buspirone, cholecalciferol, clopidogrel, furosemide 20 qd, nydrocodone-aceta prn, paroxetine, ropinirole, sevelamer carbonate, tradjenta, trazodone, vits/ supps/ prns   Levophed at 14 ug /min this afternoon    OP HD: [facility]  3.5h  400/600  42.5kg  3K/2.5Ca bath Heparin 2000   RIJ TDC/ LUE AVF (1st stage BB AVF on 6/21) - last HD 7/24, post wt 46.0kg - last Hb 10.8 on 7/24 - no esa, vdra or Fe   Assessment/ Plan: Sepsis/ shock/ suspected HCAP - getting IV  abx, vasopressor support ESRD - on HD MWF. Labs/ vol okay, no urgent need for HD. Pt is only on HD 2x per week, but while here we will run her MWF. HD tomorrow.  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 Not getting any vdra or sensipar. Hold any binders given low phos.  HTN - holding all home BP lowering meds Dementia, hx of Hypocalcemia - Ca 7.1, corrects to 9.1 which is in range.  Hypomagnesia -  getting replacement      Kelly Splinter  MD 04/16/2022, 12:05 PM Recent Labs  Lab 04/15/22 1412 04/15/22 1427 04/15/22 1844 04/16/22 0506  HGB 10.7* 11.9* 9.5* 10.0*  ALBUMIN <1.5*  --   --   --   CALCIUM 7.2*  --   --  7.1*  PHOS  --   --   --  2.6  CREATININE 2.15* 2.10*  --  2.32*  K 3.0* 2.9*  --  3.7

## 2022-04-16 NOTE — TOC Progression Note (Signed)
Transition of Care [facility]) - Progression Note    Patient Details  Name: Molly Arroyo MRN: [id number] Date of Birth: 10-08-1945  Transition of Care [facility]) CM/SW [place], RN Phone [phone]  04/16/2022, 4:01 PM  Clinical Narrative:     Transition of Care [facility]) Screening Note   Patient Details  Name: Molly Arroyo Date of Birth: 05-13-1946   Transition of Care [facility]) CM/SW Contact:    Angelita Ingles, RN Phone Number: 04/16/2022, 4:01 PM    Transition of Care Department [facility]) has reviewed patient and no TOC needs have been identified at this time. Patient is admitted from hemodialysis confused and hypotensive. Patient is currently obtunded and unable to participate in assessment. No family at the bedside. We will continue to monitor patient advancement through interdisciplinary progression rounds.           Expected Discharge Plan and Services                                                 Social Determinants of Health (SDOH) Interventions    Readmission Risk Interventions     No data to display

## 2022-04-16 NOTE — Evaluation (Signed)
Clinical/Bedside Swallow Evaluation Patient Details  Name: Molly Arroyo MRN: [id number] Date of Birth: Sep 06, 1946  Today's Date: 04/16/2022 Time: SLP Start Time (ACUTE ONLY): 65      Past Medical History:  Past Medical History:  Diagnosis Date   Chronic kidney disease    on dialysis   Diabetes mellitus without complication ([place])    Hypertension    Hypothyroidism    Thyroid disease    Past Surgical History:  Past Surgical History:  Procedure Laterality Date   AV FISTULA PLACEMENT Left 03/13/2022   Procedure: INSERTION OF LEFT ARM ARTERIOVENOUS (AV) BRACHIOBASILIC FISTULA;  Surgeon: Molly Heck, MD;  Location: [facility] OR;  Service: Vascular;  Laterality: Left;   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 CT head negative. CXR 7/24: Bibasilar streaky atelectasis, left greater than right. 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    Assessment / Plan / Recommendation  Clinical Impression  Pt was seen for bedside swallow evaluation and she denied a history of dysphagia. Oral mechanism exam was [facility] and she was edentulous. Pt reported that her dentures were broken (mandibular) and lost (maxillary) at a previous facility so she has been eating without them for months. Per the pt, she has been able to consume regular textures with some adjustments on her part. She tolerated all solids and liquids without overt s/s of aspiration. Mastication was [facility] despite her edentulous status and oral clearance was adequate. Mild oral holding/prolonged bolus manipulation was initially noted with purees, but this improved with additional trials. A regular texture diet with thin liquids is recommended at this time to maximize pt's options and SLP will follow pt to ensure tolerance. SLP Visit Diagnosis: Dysphagia, unspecified (R13.10)    Aspiration Risk        Diet Recommendation Regular;Thin liquid   Liquid Administration via: Cup;Straw Medication Administration: Whole meds with liquid Supervision: Staff to assist with self feeding Compensations: Slow rate Postural Changes: Seated upright at 90 degrees    Other  Recommendations Oral Care Recommendations: Oral care BID    Recommendations for follow up therapy are one component of a multi-disciplinary discharge planning process, led by the attending physician.  Recommendations may be updated based on patient status, additional functional criteria and insurance authorization.  Follow up Recommendations  (TBD)      Assistance Recommended at Discharge Set up Supervision/Assistance  Functional Status Assessment    Frequency and Duration min 2x/week  2 weeks       Prognosis Prognosis for Safe Diet Advancement: Good      Swallow Study   General Date of Onset: 04/15/22 HPI: Pt is a 76 y/o female who presented from hemodialysis confused and hypotensive. CT head negative. CXR 7/24: Bibasilar streaky atelectasis, left greater than right. 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 Type of Study: Bedside Swallow Evaluation Previous Swallow Assessment: none Diet Prior to this Study: NPO Temperature Spikes Noted: No Respiratory Status: Room air History of Recent Intubation: No Behavior/Cognition: Alert;Cooperative;Pleasant mood Oral Cavity Assessment: Within Functional Limits Oral Care Completed by SLP: No Oral Cavity - Dentition: Edentulous Vision: Functional for self-feeding Self-Feeding Abilities: Needs assist Patient Positioning: Upright in bed;Postural control adequate for testing Baseline Vocal Quality: Normal Volitional Cough: Weak Volitional Swallow: Able to elicit    Oral/Motor/Sensory Function Overall Oral Motor/Sensory Function: Within functional  limits   Ice Chips Ice chips: Within functional limits Presentation:  Spoon   Thin Liquid Thin Liquid: Within functional limits Presentation: Straw    Nectar Thick Nectar Thick Liquid: Not tested   Honey Thick Honey Thick Liquid: Not tested   Puree Puree: Within functional limits Presentation: Spoon   Solid     Solid: Within functional limits Presentation: Molly Arroyo, [place], [place] Office number [phone] Pager [phone]  Molly Arroyo 04/16/2022,10:34 AM

## 2022-04-16 NOTE — Progress Notes (Signed)
SLP Cancellation Note  Patient Details Name: Molly Arroyo MRN: [id number] DOB: Aug 23, 1946   Cancelled treatment:       Reason Eval/Treat Not Completed: Patient at procedure or test/unavailable (Pt currently being cleaned by RN and NT. SLP will follow up later.)  Tedd Cottrill I. Hardin Negus, [place], [place] Office number [phone] Pager [place] 04/16/2022, 9:18 AM

## 2022-04-16 NOTE — Progress Notes (Signed)
   NAME:  Molly Arroyo, MRN:  [id number], DOB:  02-11-1946, LOS: 1 ADMISSION DATE:  04/15/2022, CONSULTATION DATE:  7/25 REFERRING MD:  Doren Custard, CHIEF COMPLAINT:  7/24   History of Present Illness:  76 y/o with ESRD admitted for septic shock on 7/24.   Pertinent  Medical History  ESRD on HD MWF S/p BKA due to diabetic food ulce DM2  Hypertension hypothyroid  Significant Hospital Events: Including procedures, antibiotic start and stop dates in addition to other pertinent events   7/24 presented from hemodialysis center decreased consciousness and hypotension found to be septic secondary to pneumonia pressors on admission  Interim History / Subjective:  Mental status improved  Objective   Blood pressure 117/77, pulse 68, temperature 98.2 F (36.8 C), temperature source Oral, resp. rate 19, weight 45.1 kg, SpO2 99 %.        Intake/Output Summary (Last 24 hours) at 04/16/2022 1025 Last data filed at 04/16/2022 0400 Gross per 24 hour  Intake 2804.1 ml  Output --  Net 2804.1 ml   Filed Weights   04/16/22 0500  Weight: 45.1 kg    Examination: General:  Frail, elderly female resting comfortably in bed HENT: NCAT OP clear PULM: CTA B, normal effort CV: RRR, no mgr GI: BS+, soft, nontender MSK: normal bulk and tone Derm: < 1cm area of skin breakdown over buttock, vulvar redness and swelling without skin breakdown, skin tears over vascular surgery site left arm (see below) Neuro: awake, alert, no distress, MAEW     Resolved Hospital Problem list     Assessment & Plan:  Septic shock presumably due to HCAP, though evidence for pneumonia is scant (no cough, dyspnea) Has skin tear over recent vascular surgical site Sacral wound with perineal swelling F/u cultures Continue vanc/cefepime for now Wean off levophed for MAP > 65 Ask vascular to look at wound Wound care  ESRD on HD MWF Hyponatremia Hypocalcemia Hypomagnesmia Replace Ca, Mg Consult renal today  DM2 Add  SSI  Toxic encephalopathy due to lorazepam > much improved Minimize sedation  Best Practice (right click and "Reselect all SmartList Selections" daily)   Diet/type: NPO > f/u speech eval DVT prophylaxis: systemic heparin GI prophylaxis: N/A Lines: Central line and yes and it is still needed Foley:  N/A Code Status:  full code Last date of multidisciplinary goals of care discussion [7/24]  Critical care time: 35 minutes    Roselie Awkward, MD [place] [facility] Pager: [phone] Cell: [phone] After 7:00 pm call [place]  [phone]

## 2022-04-16 NOTE — Progress Notes (Signed)
[place] Progress Note Patient Name: Molly Arroyo DOB: 1945-12-27 MRN: [id number]   Date of Service  04/16/2022  HPI/Events of Note  Patient request for home Trazodone to be resumed to help her sleep.   eICU Interventions  Plan: Trazodone 50 mg PO Q HS.      Intervention Category Major Interventions: Other:  Lysle Dingwall 04/16/2022, 11:05 PM

## 2022-04-17 DIAGNOSIS — A419 Sepsis, unspecified organism: Secondary | ICD-10-CM | POA: Diagnosis not present

## 2022-04-17 DIAGNOSIS — N185 Chronic kidney disease, stage 5: Secondary | ICD-10-CM

## 2022-04-17 LAB — MAGNESIUM
Magnesium: 2.6 mg/dL — ABNORMAL HIGH (ref 1.7–2.4)
Magnesium: 2 mg/dL (ref 1.7–2.4)

## 2022-04-17 LAB — COMPREHENSIVE METABOLIC PANEL
ALT: 11 U/L (ref 0–44)
AST: 15 U/L (ref 15–41)
Albumin: <1.5 g/dL — ABNORMAL LOW (ref 3.5–5.0)
Alkaline Phosphatase: 74 U/L (ref 38–126)
Anion gap: 6 (ref 5–15)
BUN: 20 mg/dL (ref 8–23)
CO2: 20 mmol/L — ABNORMAL LOW (ref 22–32)
Calcium: 7.3 mg/dL — ABNORMAL LOW (ref 8.9–10.3)
Chloride: 100 mmol/L (ref 98–111)
Creatinine, Ser: 2.54 mg/dL — ABNORMAL HIGH (ref 0.44–1.00)
GFR, Estimated: 19 mL/min — ABNORMAL LOW (ref >60)
Glucose, Bld: 74 mg/dL (ref 70–99)
Potassium: 3 mmol/L — ABNORMAL LOW (ref 3.5–5.1)
Sodium: 126 mmol/L — ABNORMAL LOW (ref 135–145)
Total Bilirubin: 0.5 mg/dL (ref 0.3–1.2)
Total Protein: 4 g/dL — ABNORMAL LOW (ref 6.5–8.1)

## 2022-04-17 LAB — BASIC METABOLIC PANEL
Anion gap: 6 (ref 5–15)
BUN: 9 mg/dL (ref 8–23)
CO2: 24 mmol/L (ref 22–32)
Calcium: 7.1 mg/dL — ABNORMAL LOW (ref 8.9–10.3)
Chloride: 101 mmol/L (ref 98–111)
Creatinine, Ser: 1.59 mg/dL — ABNORMAL HIGH (ref 0.44–1.00)
GFR, Estimated: 33 mL/min — ABNORMAL LOW (ref >60)
Glucose, Bld: 84 mg/dL (ref 70–99)
Potassium: 2.7 mmol/L — CL (ref 3.5–5.1)
Sodium: 131 mmol/L — ABNORMAL LOW (ref 135–145)

## 2022-04-17 LAB — CBC
HCT: 24.1 % — ABNORMAL LOW (ref 36.0–46.0)
Hemoglobin: 8.6 g/dL — ABNORMAL LOW (ref 12.0–15.0)
MCH: 31.7 pg (ref 26.0–34.0)
MCHC: 35.7 g/dL (ref 30.0–36.0)
MCV: 88.9 fL (ref 80.0–100.0)
Platelets: 151 10*3/uL (ref 150–400)
RBC: 2.71 MIL/uL — ABNORMAL LOW (ref 3.87–5.11)
RDW: 17.3 % — ABNORMAL HIGH (ref 11.5–15.5)
WBC: 13.7 10*3/uL — ABNORMAL HIGH (ref 4.0–10.5)
nRBC: 0 % (ref 0.0–0.2)

## 2022-04-17 LAB — GLUCOSE, CAPILLARY
Glucose-Capillary: 41 mg/dL — CL (ref 70–99)
Glucose-Capillary: 157 mg/dL — ABNORMAL HIGH (ref 70–99)
Glucose-Capillary: 64 mg/dL — ABNORMAL LOW (ref 70–99)
Glucose-Capillary: 112 mg/dL — ABNORMAL HIGH (ref 70–99)
Glucose-Capillary: 55 mg/dL — ABNORMAL LOW (ref 70–99)
Glucose-Capillary: 124 mg/dL — ABNORMAL HIGH (ref 70–99)
Glucose-Capillary: 106 mg/dL — ABNORMAL HIGH (ref 70–99)
Glucose-Capillary: 90 mg/dL (ref 70–99)
Glucose-Capillary: 75 mg/dL (ref 70–99)

## 2022-04-17 LAB — HEPATITIS B SURFACE ANTIGEN: Hepatitis B Surface Ag: NONREACTIVE

## 2022-04-17 LAB — HEPATITIS B CORE ANTIBODY, TOTAL: Hep B Core Total Ab: NONREACTIVE

## 2022-04-17 LAB — PHOSPHORUS: Phosphorus: 2.1 mg/dL — ABNORMAL LOW (ref 2.5–4.6)

## 2022-04-17 LAB — HEPATITIS B SURFACE ANTIBODY,QUALITATIVE: Hep B S Ab: NONREACTIVE

## 2022-04-17 LAB — HEPATITIS C ANTIBODY: HCV Ab: NONREACTIVE

## 2022-04-17 MED ORDER — ALBUMIN HUMAN 25 % IV SOLN
25.0000 g | Freq: Once | INTRAVENOUS | Status: AC
Start: 2022-04-17 — End: 2022-04-17
  Administered 2022-04-17: 25 g via INTRAVENOUS
  Filled 2022-04-17: qty 100

## 2022-04-17 MED ORDER — DEXTROSE 50 % IV SOLN
12.5000 g | INTRAVENOUS | Status: AC
Start: 2022-04-17 — End: 2022-04-17
  Administered 2022-04-17: 12.5 g via INTRAVENOUS

## 2022-04-17 MED ORDER — DEXTROSE 50 % IV SOLN
INTRAVENOUS | Status: AC
  Filled 2022-04-17: qty 50

## 2022-04-17 MED ORDER — ALBUMIN HUMAN 25 % IV SOLN
25.0000 g | INTRAVENOUS | Status: AC | PRN
  Administered 2022-04-17: 25 g via INTRAVENOUS
  Filled 2022-04-17 (×2): qty 100

## 2022-04-17 MED ORDER — DEXTROSE 50 % IV SOLN
25.0000 g | INTRAVENOUS | Status: AC
Start: 2022-04-17 — End: 2022-04-17
  Administered 2022-04-17: 25 g via INTRAVENOUS

## 2022-04-17 MED ORDER — PSYLLIUM 95 % PO PACK
1.0000 | PACK | Freq: Every day | ORAL | Status: DC
  Administered 2022-04-17 – 2022-04-24 (×8): 1 via ORAL
  Filled 2022-04-17 (×8): qty 1

## 2022-04-17 MED ORDER — SODIUM PHOSPHATES 45 MMOLE/15ML IV SOLN
30.0000 mmol | Freq: Once | INTRAVENOUS | Status: AC
  Administered 2022-04-17: 30 mmol via INTRAVENOUS
  Filled 2022-04-17: qty 10

## 2022-04-17 MED ORDER — ALBUMIN HUMAN 25 % IV SOLN
25.0000 g | Freq: Once | INTRAVENOUS | Status: AC
  Administered 2022-04-18: 25 g via INTRAVENOUS
  Filled 2022-04-17: qty 100

## 2022-04-17 MED ORDER — HEPARIN SODIUM (PORCINE) 1000 UNIT/ML DIALYSIS
2000.0000 [IU] | Freq: Once | INTRAMUSCULAR | Status: AC
  Administered 2022-04-24: 2000 [IU] via INTRAVENOUS_CENTRAL
  Filled 2022-04-17: qty 2

## 2022-04-17 MED ORDER — INSULIN ASPART 100 UNIT/ML IJ SOLN
0.0000 [IU] | Freq: Three times a day (TID) | INTRAMUSCULAR | Status: DC
  Administered 2022-04-19 – 2022-04-23 (×2): 1 [IU] via SUBCUTANEOUS

## 2022-04-17 MED ORDER — MIDODRINE HCL 5 MG PO TABS: 10.0000 mg | ORAL_TABLET | Freq: Three times a day (TID) | ORAL | Status: DC

## 2022-04-17 MED ORDER — MIDODRINE HCL 5 MG PO TABS
10.0000 mg | ORAL_TABLET | Freq: Three times a day (TID) | ORAL | Status: DC
  Administered 2022-04-17 – 2022-04-24 (×23): 10 mg via ORAL
  Filled 2022-04-17 (×22): qty 2

## 2022-04-17 MED ORDER — ALBUMIN HUMAN 25 % IV SOLN
50.0000 g | Freq: Once | INTRAVENOUS | Status: AC
  Administered 2022-04-17: 50 g via INTRAVENOUS

## 2022-04-17 MED ORDER — ACETAMINOPHEN 325 MG PO TABS
650.0000 mg | ORAL_TABLET | ORAL | Status: DC | PRN
  Administered 2022-04-17 – 2022-04-21 (×3): 650 mg via ORAL
  Filled 2022-04-17 (×3): qty 2

## 2022-04-17 MED ORDER — POTASSIUM CHLORIDE 10 MEQ/50ML IV SOLN
10.0000 meq | INTRAVENOUS | Status: AC
  Administered 2022-04-17 – 2022-04-18 (×6): 10 meq via INTRAVENOUS
  Filled 2022-04-17 (×6): qty 50

## 2022-04-17 MED ORDER — ALBUMIN HUMAN 25 % IV SOLN
INTRAVENOUS | Status: AC
  Filled 2022-04-17: qty 100

## 2022-04-17 NOTE — Progress Notes (Signed)
[place] Progress Note Patient Name: Molly Arroyo DOB: Jan 28, 1946 MRN: [id number]   Date of Service  04/17/2022  HPI/Events of Note  Multiple issues: 1. Hypokalemia - K+ = 2.7 and Creatinine = 1.59. 2. BP remains soft post 25% Albumin = 86/47 with MAP = 60. Already on Midodrine PO.   eICU Interventions  Plan: Will replace K+. 25% albumin 25 gm IV now.      Intervention Category Major Interventions: Electrolyte abnormality - evaluation and management;Hypotension - evaluation and management  Lysle Dingwall 04/17/2022, 11:48 PM

## 2022-04-17 NOTE — Progress Notes (Signed)
At bedside for PIV/CL removal consult. 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 Right arm severely bruised with multiple skin tears /fragile skin. Recommend leaving the central line in for meds/fluids. No suitable option for PIV placement at this time. RN aware.

## 2022-04-17 NOTE — Progress Notes (Signed)
Hd cvc accessed without difficulty both ports aspirate and flush well connected a-a v-v bfr 400 pressures within limits no c/os vitals stable

## 2022-04-17 NOTE — Progress Notes (Signed)
   NAME:  Molly Arroyo, MRN:  [id number], DOB:  November 09, 1945, LOS: 2 ADMISSION DATE:  04/15/2022, CONSULTATION DATE:  7/25 REFERRING MD:  Doren Custard, CHIEF COMPLAINT:  7/24   History of Present Illness:  76 y/o with ESRD admitted for septic shock on 7/24.   Pertinent  Medical History  ESRD on HD MWF S/p BKA due to diabetic food ulce DM2  Hypertension hypothyroid  Significant Hospital Events: Including procedures, antibiotic start and stop dates in addition to other pertinent events   7/24 presented from hemodialysis center decreased consciousness and hypotension found to be septic secondary to pneumonia pressors on admission  Interim History / Subjective:  No acute event overnight. Reports lose stool likely secondary to antibiotics. Patient mentation is close to baseline. Levophed stopped this morning.  Objective   Blood pressure 118/61, pulse 73, temperature 98.1 F (36.7 C), temperature source Oral, resp. rate (!) 25, weight 46.7 kg, SpO2 99 %.        Intake/Output Summary (Last 24 hours) at 04/17/2022 0747 Last data filed at 04/17/2022 0700 Gross per 24 hour  Intake 1872.9 ml  Output --  Net 1872.9 ml    Filed Weights   04/16/22 0500 04/17/22 0209  Weight: 45.1 kg 46.7 kg    Examination: General:  Frail appearing, chronically ill HENT: NCAT OP clear PULM: CTAB, normal effort CV: RRR, no murmur GI: BS+, soft, nontender MSK: normal bulk and tone. R BKA Derm: Small skin breakdown over buttocks and skin tear in the left arm  Neuro: A & O x3, No focal neuro deficit      Resolved Hospital Problem list     Assessment & Plan:  Septic shock presumably due to HCAP, though evidence for pneumonia is scant (no cough, dyspnea) Has skin tear over recent vascular surgical site Sacral wound with perineal swelling Afebrile overnight. Blood culture no growth in <24hrs. Leukocytosis steadily trending down Seen by vascular, plan to follow up outpatient -F/u cultures -Continue  cefepime  -Wean off levophed for MAP > 65 -Wound care  ESRD on HD MWF Hyponatremia Hypocalcemia Hypomagnesmia Plan for HD today. K 3.0, Na 126. Given plan for HD today will defer to nephrology for electrolyte management. -Nephrology managing HD  DM2 -Continue  vSSI  Toxic encephalopathy due to lorazepam > much improved Mentation is close to baseline Minimize sedation  Best Practice (right click and "Reselect all SmartList Selections" daily)   Diet/type: NPO > f/u speech eval DVT prophylaxis: systemic heparin GI prophylaxis: N/A Lines: Central line and yes and it is still needed Foley:  N/A Code Status:  full code Last date of multidisciplinary goals of care discussion [7/24]  Alen Bleacher, MD PGY-2, [facility] [facility] Resident

## 2022-04-17 NOTE — Progress Notes (Signed)
[place] Progress Note Patient Name: Molly Arroyo DOB: [birth year] MRN: [id number]   Date of Service  04/17/2022  HPI/Events of Note  Multiple issues: 1. Hypotension - BP = 88/44. Patient had HD today. Albumin < 1.5. 2. Episode of SVT with ventricular rate to 140. Now back in sinus rhythm with HR  = 71.  eICU Interventions  Plan: 25% Albumin 25 gm IV now. BMP and Mg++ level STAT.     Intervention Category Major Interventions: Arrhythmia - evaluation and management;Hypotension - evaluation and management  Lysle Dingwall 04/17/2022, 9:31 PM

## 2022-04-17 NOTE — Progress Notes (Addendum)
[facility] Progress Note  Subjective: 3 L in and no output yest, is now 5 L + since admission. BP's dropped to 70s on HD, will likely not get much fluid off today w/ HD.   Vitals:   04/17/22 0645 04/17/22 0700 04/17/22 0715 04/17/22 0730  BP: (!) 124/56 (!) 113/56 (!) 99/53   Pulse: 75 73 71   Resp: 16 18 (!) 22   Temp:    98.1 F (36.7 C)  TempSrc:    Oral  SpO2: 99% 99% 99%   Weight:        Exam: Gen alert, eldelry, frail and chronically ill appearing No jvd or bruits Chest clear bilat to bases RRR no MRG Abd soft ntnd no mass or ascites +bs MS L forearm is wrapped, +bruit over AVF Ext no LE edema, 1+ bilat UE edema Neuro is alert, Ox 3 , nf    RIJ TDC intact, L AVF +bruit          Home meds include - amlodipine 10, atenolol 25 qd, buspirone, cholecalciferol, clopidogrel, furosemide 20 qd, nydrocodone-aceta prn, paroxetine, ropinirole, sevelamer carbonate, tradjenta, trazodone, vits/ supps/ prns    - levophed weaned off at 3 am today      OP HD: [facility] M-F  3.5h  400/600  42.5kg  3K/2.5Ca bath Heparin 2000   RIJ TDC/ LUE AVF (1st stage BB AVF on 6/21) - last HD 7/24, post wt 46.0kg - last Hb 10.8 on 7/24 - no esa, vdra or Fe     Assessment/ Plan: Sepsis/ shock/ suspected HCAP - getting IV abx, vasopressors off as of 3 am this morning. Will start midodrine 10 tid.  ESRD - on HD M-F 2x per week. While here we will run her MWF. Started HD in April 2023. Plan is for HD today.  Volume - some edema, +4kg, unable to pull much volume due to shock/ 3rd spacing Anemia esrd - Hb 8s, not on esa at OP unit. Will follow.  MBD ckd - CCa in range, phos is low. Not getting any vdra or sensipar. Hold any binders given low phos.  HTN - holding home meds Dementia, hx of Hypocalcemia - Ca 7.1, corrects to 9.1 which is in range.  Hypomagnesemia - getting replacement Hypokalemia - use 4K bath w/ HD   Rob Andersson [place] 04/17/2022, 7:33 AM   Recent Labs  Lab  04/15/22 1412 04/15/22 1427 04/16/22 0506 04/17/22 0106  HGB 10.7*   < > 10.0* 8.6*  ALBUMIN <1.5*  --   --  <1.5*  CALCIUM 7.2*  --  7.1* 7.3*  PHOS  --   --  2.6 2.1*  CREATININE 2.15*   < > 2.32* 2.54*  K 3.0*   < > 3.7 3.0*   < > = values in this interval not displayed.   No results for input(s): "IRON", "TIBC", "FERRITIN" in the last 168 hours. Inpatient medications:  Chlorhexidine Gluconate Cloth  6 each Topical Q0600   Chlorhexidine Gluconate Cloth  6 each Topical Q0600   Gerhardt's butt cream   Topical QID   heparin  5,000 Units Subcutaneous Q8H   insulin aspart  0-6 Units Subcutaneous Q4H   midodrine  10 mg Oral TID WC   mupirocin ointment  1 Application Nasal BID   sodium chloride flush  10-40 mL Intracatheter Q12H   traZODone  50 mg Oral QHS   vancomycin variable dose per unstable renal function (pharmacist dosing)   Does not apply See admin instructions  ceFEPime (MAXIPIME) IV Stopped (04/16/22 2301)   norepinephrine (LEVOPHED) Adult infusion Stopped (04/17/22 0322)   sodium phosphate 30 mmol in dextrose 5 % 250 mL infusion 43 mL/hr at 04/17/22 0700   docusate sodium, ondansetron (ZOFRAN) IV, polyethylene glycol, sodium chloride flush

## 2022-04-17 NOTE — Procedures (Signed)
   I was present at this dialysis session, have reviewed the session itself and made  appropriate changes Kelly Splinter MD [place] pager [phone]   04/17/2022, 10:51 AM

## 2022-04-17 NOTE — Progress Notes (Signed)
SLP Cancellation Note  Patient Details Name: Molly Arroyo MRN: [id number] DOB: Oct 09, 1945   Cancelled treatment:       Reason Eval/Treat Not Completed: Other (comment). Pt sleeping, checked in with RN who reported pts mental status has improved, pt ahs been self feeding regular food and taking pills with water without concern. Will sign off at this time.    Rex Magee, Katherene [place] 04/17/2022, 3:02 PM

## 2022-04-17 NOTE — Progress Notes (Signed)
Attending:    Subjective: Off vasopressors Vascular looked at wound, looked OK to them Continues of vasopressors Blood cultures negative Renal following, for HD today  Objective: Vitals:   04/17/22 0908 04/17/22 0915 04/17/22 0930 04/17/22 0945  BP: (!) 87/51 (!) 71/42 (!) 82/52 96/61  Pulse: 84 92 85 85  Resp: 20 (!) 22 19 (!) 22  Temp:      TempSrc:      SpO2: 98% 93% 96% 96%  Weight:          Intake/Output Summary (Last 24 hours) at 04/17/2022 1005 Last data filed at 04/17/2022 3668 Gross per 24 hour  Intake 1667.73 ml  Output --  Net 1667.73 ml    General:  Resting comfortably in bed HENT: NCAT OP clear PULM: CTA B, normal effort CV: RRR, no mgr GI: BS+, soft, nontender MSK: normal bulk and tone Neuro: awake, alert, no distress, MAEW    CBC    Component Value Date/Time   WBC 13.7 (H) 04/17/2022 0106   RBC 2.71 (L) 04/17/2022 0106   HGB 8.6 (L) 04/17/2022 0106   HCT 24.1 (L) 04/17/2022 0106   PLT 151 04/17/2022 0106   MCV 88.9 04/17/2022 0106   MCH 31.7 04/17/2022 0106   MCHC 35.7 04/17/2022 0106   RDW 17.3 (H) 04/17/2022 0106   LYMPHSABS 0.7 04/15/2022 1412   MONOABS 0.6 04/15/2022 1412   EOSABS 0.1 04/15/2022 1412   BASOSABS 0.1 04/15/2022 1412    BMET    Component Value Date/Time   NA 126 (L) 04/17/2022 0106   K 3.0 (L) 04/17/2022 0106   CL 100 04/17/2022 0106   CO2 20 (L) 04/17/2022 0106   GLUCOSE 74 04/17/2022 0106   BUN 20 04/17/2022 0106   CREATININE 2.54 (H) 04/17/2022 0106   CALCIUM 7.3 (L) 04/17/2022 0106   GFRNONAA 19 (L) 04/17/2022 0106     Impression/Plan: Septic shock due to HCAP> stop vanc, continue cefepime 5 days, monitor hemodynamics, d/c levophed ESRD> HD today Sacral wound Skin tear> wound care DM2 > very sensitive scale to continue  Send to floor, TRH service  Roselie Awkward, MD [place] [facility] Pager: [phone] Cell: [phone] After 7pm: [phone]

## 2022-04-17 NOTE — Progress Notes (Signed)
[facility] Progress Note Patient Name: Molly Arroyo DOB: 1945/11/18 MRN: [id number]   Date of Service  04/17/2022  HPI/Events of Note  Hypokalemia  Hypophosphatemia  - K+ = 3.0, PO4--- = 2.1 and Creatinine = 2.54.  eICU Interventions  Plan: Given ESRD will defer K+ management to nephrology. Will replace PO4---.     Intervention Category Major Interventions: Electrolyte abnormality - evaluation and management  Kortny Lirette Eugene 04/17/2022, 3:37 AM

## 2022-04-17 NOTE — Progress Notes (Signed)
Bp rechecked 85/46 pt asymtomatic

## 2022-04-17 NOTE — Progress Notes (Signed)
Received patient in bed, alert and oriented. Informed consent signed and in chart.  Time tx completed:1135  HD treatment completed. Patient tolerated well. Fistula/Graft/HD catheter without signs and symptoms of complications. Patient transported back to the room, alert and orient and in no acute distress. Report given to bedside RN.  Total UF removed:0.5L  Medication given:albumin  Post HD VS: stable see flowsheet  Post HD weight: unable to measure

## 2022-04-17 NOTE — Progress Notes (Signed)
PT IN BED STABLE AND ALERT, HD BS DUE TO ICU STATUS AVF +/+ NOT IN USE RN OBTAINED CONSENT UFG 1L

## 2022-04-17 NOTE — Progress Notes (Signed)
Pt alert and stable uf paused given 157ml ns HOB reclined

## 2022-04-17 NOTE — Progress Notes (Signed)
   VASCULAR SURGERY ASSESSMENT & PLAN:   S/P LEFT FIRST STAGE BASILIC VEIN TRANSPOSITION: Cellulitis and swelling in the left upper arm incision has improved.  I do not think this is a source of sepsis.  Follow-up as already been arranged in our office to discuss second stage of DVT in the future.  Vascular surgery will be available as needed.  SUBJECTIVE:   No complaints this morning.  PHYSICAL EXAM:   Vitals:   04/17/22 0445 04/17/22 0500 04/17/22 0515 04/17/22 0530  BP: 109/60 (!) 100/51 (!) 95/54 (!) 100/55  Pulse: 73 73 73 74  Resp: 13 15 (!) 0 14  Temp:      TempSrc:      SpO2: 99% 96% 98% 98%  Weight:       The swelling and erythema in her left arm incision have improved.  LABS:   Lab Results  Component Value Date   WBC 13.7 (H) 04/17/2022   HGB 8.6 (L) 04/17/2022   HCT 24.1 (L) 04/17/2022   MCV 88.9 04/17/2022   PLT 151 04/17/2022   Lab Results  Component Value Date   CREATININE 2.54 (H) 04/17/2022   CBG (last 3)  Recent Labs    04/16/22 2318 04/17/22 0314 04/17/22 0340  GLUCAP 123* 41* 157*    PROBLEM LIST:    Principal Problem:   Sepsis ([place]) Active Problems:   Pressure injury of skin   CURRENT MEDS:    Chlorhexidine Gluconate Cloth  6 each Topical Q0600   Chlorhexidine Gluconate Cloth  6 each Topical Q0600   Gerhardt's butt cream   Topical QID   heparin  5,000 Units Subcutaneous Q8H   insulin aspart  0-6 Units Subcutaneous Q4H   mupirocin ointment  1 Application Nasal BID   sodium chloride flush  10-40 mL Intracatheter Q12H   traZODone  50 mg Oral QHS   vancomycin variable dose per unstable renal function (pharmacist dosing)   Does not apply See admin instructions    Deitra Mayo Office: [phone] 04/17/2022

## 2022-04-18 DIAGNOSIS — J189 Pneumonia, unspecified organism: Secondary | ICD-10-CM

## 2022-04-18 DIAGNOSIS — R6521 Severe sepsis with septic shock: Secondary | ICD-10-CM | POA: Diagnosis not present

## 2022-04-18 DIAGNOSIS — A419 Sepsis, unspecified organism: Secondary | ICD-10-CM | POA: Diagnosis not present

## 2022-04-18 LAB — BASIC METABOLIC PANEL
Anion gap: 5 (ref 5–15)
BUN: 9 mg/dL (ref 8–23)
CO2: 21 mmol/L — ABNORMAL LOW (ref 22–32)
Calcium: 7.6 mg/dL — ABNORMAL LOW (ref 8.9–10.3)
Chloride: 103 mmol/L (ref 98–111)
Creatinine, Ser: 1.84 mg/dL — ABNORMAL HIGH (ref 0.44–1.00)
GFR, Estimated: 28 mL/min — ABNORMAL LOW (ref >60)
Glucose, Bld: 117 mg/dL — ABNORMAL HIGH (ref 70–99)
Potassium: 3.5 mmol/L (ref 3.5–5.1)
Sodium: 129 mmol/L — ABNORMAL LOW (ref 135–145)

## 2022-04-18 LAB — GLUCOSE, CAPILLARY
Glucose-Capillary: 104 mg/dL — ABNORMAL HIGH (ref 70–99)
Glucose-Capillary: 108 mg/dL — ABNORMAL HIGH (ref 70–99)
Glucose-Capillary: 114 mg/dL — ABNORMAL HIGH (ref 70–99)
Glucose-Capillary: 133 mg/dL — ABNORMAL HIGH (ref 70–99)
Glucose-Capillary: 181 mg/dL — ABNORMAL HIGH (ref 70–99)
Glucose-Capillary: 67 mg/dL — ABNORMAL LOW (ref 70–99)
Glucose-Capillary: 99 mg/dL (ref 70–99)

## 2022-04-18 LAB — HEPATITIS B SURFACE ANTIBODY, QUANTITATIVE: Hep B S AB Quant (Post): <3.1 m[IU]/mL — ABNORMAL LOW (ref >9.9)

## 2022-04-18 LAB — MAGNESIUM: Magnesium: 2.1 mg/dL (ref 1.7–2.4)

## 2022-04-18 MED ORDER — DEXTROSE 10 % IV SOLN: INTRAVENOUS | Status: DC

## 2022-04-18 MED ORDER — DEXTROSE 50 % IV SOLN
INTRAVENOUS | Status: AC
  Filled 2022-04-18: qty 50

## 2022-04-18 MED ORDER — DEXTROSE 50 % IV SOLN
12.5000 g | INTRAVENOUS | Status: AC
  Administered 2022-04-18: 12.5 g via INTRAVENOUS

## 2022-04-18 MED ORDER — CHLORHEXIDINE GLUCONATE CLOTH 2 % EX PADS
6.0000 | MEDICATED_PAD | Freq: Every day | CUTANEOUS | Status: DC
  Administered 2022-04-19 – 2022-04-24 (×6): 6 via TOPICAL

## 2022-04-18 NOTE — Progress Notes (Signed)
[facility] Progress Note  Subjective: only 0.5 L UF w/ HD yesterday due to low BPs. Pt seen in ICU bed. Frail and HOH. No c/o's.   Vitals:   04/18/22 1030 04/18/22 1045 04/18/22 1100 04/18/22 1122  BP: 109/71 (!) 120/55 104/65   Pulse: 77 77 76   Resp: 18 20 13    Temp:    99.3 F (37.4 C)  TempSrc:    Oral  SpO2: 96% 98% 97%   Weight:        Exam: Gen alert, eldelry, quite frail and chronically ill appearing No jvd or bruits Chest clear bilat to bases RRR no MRG Abd soft ntnd no mass or ascites +bs MS L forearm is wrapped, +bruit over AVF Ext no LE edema, 1+ bilat UE edema Neuro is alert, Ox 3 , nf    RIJ TDC intact, L AVF +bruit          Home meds include - amlodipine 10, atenolol 25 qd, buspirone, cholecalciferol, clopidogrel, furosemide 20 qd, nydrocodone-aceta prn, paroxetine, ropinirole, sevelamer carbonate, tradjenta, trazodone, vits/ supps/ prns      OP HD: [facility] M-F  3.5h  400/600  42.5kg  3K/2.5Ca bath Heparin 2000   RIJ TDC/ LUE AVF (1st stage BB AVF on 6/21) - last HD 7/24, post wt 46.0kg - last Hb 10.8 on 7/24 - no esa, vdra or Fe     Assessment/ Plan: Sepsis/ shock/ HCAP - getting IV abx, pressors off. Midodrine started. For transfer out of ICU today.  ESRD - on HD M-F 2x per week. While here we will run her MWF. Started HD in April 2023. Next HD tomorrow.  Volume - some edema, +4kg, unable to pull much volume due to shock/ 3rd spacing Anemia esrd - Hb 8s, not on esa at OP unit. Will follow.  MBD ckd - CCa in range, phos is low. Not getting any vdra or sensipar. Hold any binders given low phos.  HTN - holding home meds Hx of dementia Hypocalcemia - Ca 7.1, corrects to 9.1 which is in range.  Hypomagnesemia - getting replacement Hypokalemia - use 4K bath w/ HD FTT - pt is very frail , on HD for 2-3 months. Will consult pall care team for [place].    Rob Chistian Kasler 04/18/2022, 11:55 AM   Recent Labs  Lab 04/15/22 1412 04/15/22 1427  04/16/22 0506 04/17/22 0106 04/17/22 2200  HGB 10.7*   < > 10.0* 8.6*  --   ALBUMIN <1.5*  --   --  <1.5*  --   CALCIUM 7.2*  --  7.1* 7.3* 7.1*  PHOS  --   --  2.6 2.1*  --   CREATININE 2.15*   < > 2.32* 2.54* 1.59*  K 3.0*   < > 3.7 3.0* 2.7*   < > = values in this interval not displayed.    No results for input(s): "IRON", "TIBC", "FERRITIN" in the last 168 hours. Inpatient medications:  Chlorhexidine Gluconate Cloth  6 each Topical Q0600   Chlorhexidine Gluconate Cloth  6 each Topical Q0600   Gerhardt's butt cream   Topical QID   heparin  2,000 Units Dialysis Once in dialysis   heparin  5,000 Units Subcutaneous Q8H   insulin aspart  0-6 Units Subcutaneous TID WC   midodrine  10 mg Oral TID WC   mupirocin ointment  1 Application Nasal BID   psyllium  1 packet Oral Daily   sodium chloride flush  10-40 mL Intracatheter Q12H  traZODone  50 mg Oral QHS    albumin human Stopped (04/17/22 1100)   ceFEPime (MAXIPIME) IV Stopped (04/17/22 2154)   dextrose 40 mL/hr at 04/18/22 0900   acetaminophen, albumin human, docusate sodium, ondansetron (ZOFRAN) IV, polyethylene glycol, sodium chloride flush

## 2022-04-18 NOTE — Progress Notes (Signed)
Pt receives out-pt HD at [facility] on Monday and Friday. Pt has a 11:30 chair time. Will assist as needed.   Melven Sartorius Renal Navigator [phone]

## 2022-04-18 NOTE — Progress Notes (Signed)
Physical Therapy Evaluation Patient Details Name: Molly Arroyo MRN: [id number] DOB: 10-28-1945 Today's Date: 04/18/2022  History of Present Illness  pt is a 76 y/o female admitted 7/24 to the ED from HD center after becoming minimally responsive with hypotension during HD.  In ED pt profoundly hypotensive and mildly hypothermic.  CT of chest revealed R LL and middle lobe PNA.  Pt treated for sepsis.  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  Pt was not at excellent historian and not fully sure of PLOF.  Presently, pt needing min to moderate assist for basic mobility.  Pt currently limited functionally due to the problems listed. ( See problems list.)   Pt will benefit from PT to maximize function and safety in order to get ready for next venue listed below.        Recommendations for follow up therapy are one component of a multi-disciplinary discharge planning process, led by the attending physician.  Recommendations may be updated based on patient status, additional functional criteria and insurance authorization.  Follow Up Recommendations Skilled nursing-short term rehab (<3 hours/day) Can patient physically be transported by private vehicle: No    Assistance Recommended at Discharge Frequent or constant Supervision/Assistance  Patient can return home with the following  A lot of help with walking and/or transfers;A little help with bathing/dressing/bathroom;Assistance with cooking/housework;Direct supervision/assist for medications management;Direct supervision/assist for financial management;Assist for transportation    Equipment Recommendations    Recommendations for Other Services       Functional Status Assessment Patient has had a recent decline in their functional status and demonstrates the ability to make significant  improvements in function in a reasonable and predictable amount of time.     Precautions / Restrictions Precautions Precautions: Fall      Mobility  Bed Mobility Overal bed mobility: Needs Assistance Bed Mobility: Supine to Sit, Sit to Supine     Supine to sit: Min assist Sit to supine: Mod assist   General bed mobility comments: cues for technique, truncal assist up and LE assist to supine    Transfers                   General transfer comment: pt deferred standing attempts or OOB to chair    Ambulation/Gait                  Stairs            Wheelchair Mobility    Modified Rankin (Stroke Patients Only)       Balance Overall balance assessment: Needs assistance Sitting-balance support: No upper extremity supported, Feet supported Sitting balance-Leahy Scale: Fair Sitting balance - Comments: sat unassisted, no UE assist and kicked her LE's out.                                     Pertinent Vitals/Pain Pain Assessment Pain Assessment: Faces Faces Pain Scale: No hurt Pain Intervention(s): Monitored during session    Home Living Family/patient expects to be discharged to:: Skilled nursing facility                        Prior Function Prior Level of Function : Needs assist  Mobility Comments: assisted transfer to a w/c, face to face or by lift. ADLs Comments: toilets on a bedpan, Eats in bed.     Hand Dominance        Extremity/Trunk Assessment   Upper Extremity Assessment Upper Extremity Assessment: Generalized weakness    Lower Extremity Assessment Lower Extremity Assessment: Generalized weakness (both are stiff and weak)    Cervical / Trunk Assessment Cervical / Trunk Assessment: Normal  Communication   Communication: No difficulties;HOH  Cognition Arousal/Alertness: Awake/alert Behavior During Therapy: WFL for tasks assessed/performed Overall Cognitive Status: No  family/caregiver present to determine baseline cognitive functioning                                 General Comments: O x3, didn't remember coming to [facility].        General Comments      Exercises Other Exercises Other Exercises: warm up hip/knee ROM  with graded assist/resistance x 10 reps   Assessment/Plan    PT Assessment Patient needs continued PT services  PT Problem List Decreased strength;Decreased activity tolerance;Decreased balance;Decreased mobility;Cardiopulmonary status limiting activity       PT Treatment Interventions Functional mobility training;DME instruction;Therapeutic activities;Therapeutic exercise;Balance training;Patient/family education    PT Goals (Current goals can be found in the Care Plan section)  Acute Rehab PT Goals Patient Stated Goal: pt did not relate her goals PT Goal Formulation: Patient unable to participate in goal setting Time For Goal Achievement: 05/02/22 Potential to Achieve Goals: Fair    Frequency Min 3X/week     Co-evaluation               AM-PAC PT "6 Clicks" Mobility  Outcome Measure Help needed turning from your back to your side while in a flat bed without using bedrails?: A Little Help needed moving from lying on your back to sitting on the side of a flat bed without using bedrails?: A Little Help needed moving to and from a bed to a chair (including a wheelchair)?: A Lot Help needed standing up from a chair using your arms (e.g., wheelchair or bedside chair)?: A Lot Help needed to walk in hospital room?: Total Help needed climbing 3-5 steps with a railing? : Total 6 Click Score: 12    End of Session   Activity Tolerance: Patient tolerated treatment well Patient left: in bed;with call bell/phone within reach;with bed alarm set Nurse Communication: Mobility status PT Visit Diagnosis: Other abnormalities of gait and mobility (R26.89);Muscle weakness (generalized) (M62.81)    Time: 3244-0102 PT  Time Calculation (min) (ACUTE ONLY): 26 min   Charges:   PT Evaluation $PT Eval Moderate Complexity: 1 Mod PT Treatments $Therapeutic Activity: 8-22 mins        04/18/2022  Ginger Carne., PT Acute Rehabilitation Services [phone]  (pager) [phone]  (office)  Tessie Fass Jaxsyn Azam 04/18/2022, 4:05 PM

## 2022-04-18 NOTE — Progress Notes (Signed)
Patient arrived to [facility] room 7 alert and oriented pain level 0/10. Tele box #10 applied, rectal tube in place. Bed in lowest position. Call light in reach. Patient currently eating her dinner. Will continue to monitor patient

## 2022-04-18 NOTE — Progress Notes (Signed)
[facility] Progress Note Patient Name: Molly Arroyo DOB: 1946/08/24 MRN: [id number]   Date of Service  04/18/2022  HPI/Events of Note  Hypoglycemia - Blood glucose = 67.  eICU Interventions  Plan: D10W IV infusion at 40 mL/hour.      Intervention Category Major Interventions: Other:  Kasara Schomer Cornelia Copa 04/18/2022, 12:22 AM

## 2022-04-18 NOTE — Progress Notes (Signed)
[facility]                                                                              Molly Arroyo, is a 76 y.o. 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  In ED, patient was profoundly hypotensive with blood pressure 79/40, mildly hypothermic with temperature 96.6 F.  Creatinine 2.15, albumin less than 1.5, sodium 134, potassium 3, WBC 18.2, hemoglobin 10.7. CT chest obtained showed right lower lobe and right middle lobe pneumonia.  CT head negative.  Patient was placed on vasopressors for severe sepsis and admitted to ICU 7/27: Patient transferred to telemetry, TRH assumed care  Assessment & Plan    Principal Problem: Severe sepsis ([place]) with septic shock secondary to multifocal pneumonia -Patient presented with hypotension, hypothermia, leukocytosis, lactic acidosis, CT chest with multifocal pneumonia/HCAP -Blood cultures negative so far -Sepsis physiology improving, currently off vasopressors.  BP still low, started on midodrine. -Vancomycin stopped, continue cefepime 5 days  Active Problems: ESRD on HD complicated with hypotension, hypoalbuminemia -Neurology following, HD per renal -Continue midodrine, received IV albumin overnight  Hypokalemia -Was replaced yesterday, will recheck potassium, magnesium  Diabetes mellitus type 2, uncontrolled, with hyperglycemia, ESRD Recent Labs    04/17/22 2318 04/18/22 0016 04/18/22 0043 04/18/22 0319 04/18/22 0741 04/18/22 1121  GLUCAP 75 67* 133* 108* 99 114*   - Currently on very  sensitive sliding scale insulin  Episode of SVT on 7/26 -Resolved, back in NSR  Dementia -Currently stable, no acute issues  Anemia of chronic disease/ESRD -H&H currently stable, monitor counts     Pressure injury of skin, sacral wound, POA Pressure Injury 04/15/22 Sacrum Mid Stage 2 -  Partial thickness loss of dermis presenting as a shallow open injury with a red, pink wound bed without slough. (Active)  04/15/22 1856  Location: Sacrum  Location Orientation: Mid  Staging: Stage 2 -  Partial thickness loss of dermis presenting as a shallow open injury with a red, pink wound bed without slough.  Wound Description (Comments):   Present on Admission: Yes  Dressing Type Foam - Lift dressing to assess site every shift 04/18/22 0800     Pressure Injury 04/15/22 Buttocks Right Stage 2 -  Partial thickness loss of dermis presenting as a shallow open injury with a red, pink wound bed without slough. skin broken (Active)  04/15/22 1857  Location: Buttocks  Location Orientation: Right  Staging: Stage 2 -  Partial thickness loss of dermis presenting as a shallow open injury with a red, pink wound bed without slough.  Wound Description (Comments): skin broken  Present on Admission: Yes  Dressing Type Foam - Lift dressing to assess site every shift  04/18/22 0800   Moderate protein calorie malnutrition, hypoalbuminemia less than 1.5 Estimated body mass index is 18.79 kg/m as calculated from the following:   Height as of 03/13/22: 5\' 2"  (1.575 m).   Weight as of this encounter: 46.6 kg.  Code Status: Full CODE STATUS DVT Prophylaxis:  heparin injection 5,000 Units Start: 04/15/22 2200 SCDs Start: 04/15/22 1730   Level of Care: Level of care: Telemetry Medical Family Communication: Updated patient.  No family at the bedside   Disposition Plan:      Remains inpatient appropriate: Transfer to telemetry floor, start PT OT.  DC when cleared by nephrology and BP stable   Procedures:   HD  Consultants:   Patient was admitted by [facility] Nephrology Vascular surgery  Antimicrobials:   Anti-infectives (From admission, onward)    Start     Dose/Rate Route Frequency Ordered Stop   04/17/22 1700  vancomycin (VANCOREADY) IVPB 500 mg/100 mL  Status:  Discontinued        500 mg 100 mL/hr over 60 Minutes Intravenous Every 48 hours 04/15/22 1816 04/15/22 1817   04/16/22 2200  ceFEPIme (MAXIPIME) 1 g in sodium chloride 0.9 % 100 mL IVPB        1 g 200 mL/hr over 30 Minutes Intravenous Every 24 hours 04/15/22 1818 04/21/22 2159   04/16/22 1600  ceFEPIme (MAXIPIME) 2 g in sodium chloride 0.9 % 100 mL IVPB  Status:  Discontinued        2 g 200 mL/hr over 30 Minutes Intravenous Every 24 hours 04/15/22 1816 04/15/22 1818   04/16/22 0802  vancomycin variable dose per unstable renal function (pharmacist dosing)  Status:  Discontinued         Does not apply See admin instructions 04/16/22 0803 04/17/22 1009   04/15/22 1445  ceFEPIme (MAXIPIME) 2 g in sodium chloride 0.9 % 100 mL IVPB        2 g 200 mL/hr over 30 Minutes Intravenous  Once 04/15/22 1438 04/15/22 1703   04/15/22 1445  metroNIDAZOLE (FLAGYL) IVPB 500 mg        500 mg 100 mL/hr over 60 Minutes Intravenous  Once 04/15/22 1438 04/15/22 1703   04/15/22 1445  vancomycin (VANCOCIN) IVPB 1000 mg/200 mL premix        1,000 mg 200 mL/hr over 60 Minutes Intravenous  Once 04/15/22 1438 04/15/22 1821          Medications  Chlorhexidine Gluconate Cloth  6 each Topical Q0600   Chlorhexidine Gluconate Cloth  6 each Topical Q0600   Gerhardt's butt cream   Topical QID   heparin  2,000 Units Dialysis Once in dialysis   heparin  5,000 Units Subcutaneous Q8H   insulin aspart  0-6 Units Subcutaneous TID WC   midodrine  10 mg Oral TID WC   mupirocin ointment  1 Application Nasal BID   psyllium  1 packet Oral Daily   sodium chloride flush  10-40 mL Intracatheter Q12H   traZODone  50 mg Oral QHS      Subjective:   Merica Prell was seen and examined today.  Hard of hearing otherwise no acute ongoing issues.  Patient denies dizziness, chest pain, shortness of breath, abdominal pain, N/V/D/C, new weakness, numbess, tingling. No acute events overnight.    Objective:   Vitals:   04/18/22 1030 04/18/22 1045 04/18/22 1100 04/18/22 1122  BP: 109/71 (!) 120/55 104/65   Pulse: 77 77 76   Resp: 18 20 13    Temp:  99.3 F (37.4 C)  TempSrc:    Oral  SpO2: 96% 98% 97%   Weight:        Intake/Output Summary (Last 24 hours) at 04/18/2022 1138 Last data filed at 04/18/2022 0900 Gross per 24 hour  Intake 1070.14 ml  Output 1800 ml  Net -729.86 ml     Wt Readings from Last 3 Encounters:  04/17/22 46.6 kg  03/13/22 44.9 kg  02/26/22 44 kg     Exam General: Alert and oriented, frail and chronically ill-appearing Cardiovascular: S1 S2 auscultated,  RRR Respiratory: Clear to auscultation bilaterally, no wheezing Gastrointestinal: Soft, nontender, nondistended, + bowel sounds Ext: right BKA Neuro: no new deficits Skin: Skin breakdown over buttocks, left arm Psych: Normal affect and demeanor, alert and oriented x3     Data Reviewed:  I have personally reviewed following labs    CBC Lab Results  Component Value Date   WBC 13.7 (H) 04/17/2022   RBC 2.71 (L) 04/17/2022   HGB 8.6 (L) 04/17/2022   HCT 24.1 (L) 04/17/2022   MCV 88.9 04/17/2022   MCH 31.7 04/17/2022   PLT 151 04/17/2022   MCHC 35.7 04/17/2022   RDW 17.3 (H) 04/17/2022   LYMPHSABS 0.7 04/15/2022   MONOABS 0.6 04/15/2022   EOSABS 0.1 04/15/2022   BASOSABS 0.1 40/04/6760     Last metabolic panel Lab Results  Component Value Date   NA 131 (L) 04/17/2022   K 2.7 (LL) 04/17/2022   CL 101 04/17/2022   CO2 24 04/17/2022   BUN 9 04/17/2022   CREATININE 1.59 (H) 04/17/2022   GLUCOSE 84 04/17/2022   GFRNONAA 33 (L) 04/17/2022   CALCIUM 7.1 (L) 04/17/2022   PHOS 2.1 (L) 04/17/2022   PROT 4.0 (L) 04/17/2022   ALBUMIN <1.5 (L)  04/17/2022   LABGLOB 2.9 02/13/2021   AGRATIO 1.0 02/13/2021   BILITOT 0.5 04/17/2022   ALKPHOS 74 04/17/2022   AST 15 04/17/2022   ALT 11 04/17/2022   ANIONGAP 6 04/17/2022    CBG (last 3)  Recent Labs    04/18/22 0319 04/18/22 0741 04/18/22 1121  GLUCAP 108* 99 114*      Coagulation Profile: Recent Labs  Lab 04/15/22 1418  INR 1.4*     Radiology Studies: I have personally reviewed the imaging studies  No results found.     Estill Cotta M.D. [facility] 04/18/2022, 11:38 AM  Available via Epic secure chat 7am-7pm After 7 pm, please refer to night coverage provider listed on amion.

## 2022-04-18 NOTE — TOC Progression Note (Signed)
Transition of Care [facility]) - Progression Note    Patient Details  Name: Molly Arroyo MRN: [id number] Date of Birth: [birth year]  [facility]) CM/SW [place], RN Phone Number:[phone]  04/18/2022, 10:15 AM  Clinical Narrative:    Patient is from [facility] in [place] and the facility is willing to take patient back when medically ready for discharge.  Contact person Carollee Leitz [phone])        Expected Discharge Plan and Services                                                 Social Determinants of Health (SDOH) Interventions    Readmission Risk Interventions     No data to display

## 2022-04-19 ENCOUNTER — Encounter (HOSPITAL_COMMUNITY): Payer: Self-pay | Admitting: Pulmonary Disease

## 2022-04-19 DIAGNOSIS — Z515 Encounter for palliative care: Secondary | ICD-10-CM

## 2022-04-19 DIAGNOSIS — N186 End stage renal disease: Secondary | ICD-10-CM

## 2022-04-19 DIAGNOSIS — R6521 Severe sepsis with septic shock: Secondary | ICD-10-CM | POA: Diagnosis not present

## 2022-04-19 DIAGNOSIS — Z992 Dependence on renal dialysis: Secondary | ICD-10-CM

## 2022-04-19 DIAGNOSIS — J189 Pneumonia, unspecified organism: Secondary | ICD-10-CM | POA: Diagnosis not present

## 2022-04-19 DIAGNOSIS — Z7189 Other specified counseling: Secondary | ICD-10-CM

## 2022-04-19 DIAGNOSIS — A419 Sepsis, unspecified organism: Secondary | ICD-10-CM | POA: Diagnosis not present

## 2022-04-19 LAB — RENAL FUNCTION PANEL
Albumin: 2.4 g/dL — ABNORMAL LOW (ref 3.5–5.0)
Anion gap: 11 (ref 5–15)
BUN: 5 mg/dL — ABNORMAL LOW (ref 8–23)
CO2: 19 mmol/L — ABNORMAL LOW (ref 22–32)
Calcium: 7.8 mg/dL — ABNORMAL LOW (ref 8.9–10.3)
Chloride: 102 mmol/L (ref 98–111)
Creatinine, Ser: 1.54 mg/dL — ABNORMAL HIGH (ref 0.44–1.00)
GFR, Estimated: 35 mL/min — ABNORMAL LOW (ref >60)
Glucose, Bld: 244 mg/dL — ABNORMAL HIGH (ref 70–99)
Phosphorus: <1 mg/dL — CL (ref 2.5–4.6)
Potassium: 4.3 mmol/L (ref 3.5–5.1)
Sodium: 132 mmol/L — ABNORMAL LOW (ref 135–145)

## 2022-04-19 LAB — GLUCOSE, CAPILLARY
Glucose-Capillary: 89 mg/dL (ref 70–99)
Glucose-Capillary: 65 mg/dL — ABNORMAL LOW (ref 70–99)
Glucose-Capillary: 45 mg/dL — ABNORMAL LOW (ref 70–99)
Glucose-Capillary: 69 mg/dL — ABNORMAL LOW (ref 70–99)
Glucose-Capillary: 72 mg/dL (ref 70–99)
Glucose-Capillary: 85 mg/dL (ref 70–99)
Glucose-Capillary: <10 mg/dL — CL (ref 70–99)
Glucose-Capillary: 145 mg/dL — ABNORMAL HIGH (ref 70–99)
Glucose-Capillary: 199 mg/dL — ABNORMAL HIGH (ref 70–99)
Glucose-Capillary: 216 mg/dL — ABNORMAL HIGH (ref 70–99)

## 2022-04-19 LAB — BASIC METABOLIC PANEL
Anion gap: 4 — ABNORMAL LOW (ref 5–15)
BUN: 10 mg/dL (ref 8–23)
CO2: 20 mmol/L — ABNORMAL LOW (ref 22–32)
Calcium: 7.9 mg/dL — ABNORMAL LOW (ref 8.9–10.3)
Chloride: 106 mmol/L (ref 98–111)
Creatinine, Ser: 2.15 mg/dL — ABNORMAL HIGH (ref 0.44–1.00)
GFR, Estimated: 23 mL/min — ABNORMAL LOW (ref >60)
Glucose, Bld: 86 mg/dL (ref 70–99)
Potassium: 2.9 mmol/L — ABNORMAL LOW (ref 3.5–5.1)
Sodium: 130 mmol/L — ABNORMAL LOW (ref 135–145)

## 2022-04-19 MED ORDER — HEPARIN SODIUM (PORCINE) 1000 UNIT/ML IJ SOLN
INTRAMUSCULAR | Status: AC
  Administered 2022-04-19: 3.8 [IU]
  Filled 2022-04-19: qty 4

## 2022-04-19 MED ORDER — TRAZODONE HCL 50 MG PO TABS: 50.0000 mg | ORAL_TABLET | Freq: Every evening | ORAL | Status: DC | PRN

## 2022-04-19 MED ORDER — SODIUM PHOSPHATES 45 MMOLE/15ML IV SOLN
30.0000 mmol | Freq: Once | INTRAVENOUS | Status: AC
  Administered 2022-04-19: 30 mmol via INTRAVENOUS
  Filled 2022-04-19: qty 10

## 2022-04-19 MED ORDER — POTASSIUM CHLORIDE CRYS ER 20 MEQ PO TBCR
40.0000 meq | EXTENDED_RELEASE_TABLET | Freq: Once | ORAL | Status: AC
  Administered 2022-04-19: 40 meq via ORAL
  Filled 2022-04-19: qty 2

## 2022-04-19 NOTE — Progress Notes (Signed)
Patient arrived back to [place] room 7 alert and oriented. Pain level 0/10. Bed in lowest position. Call light in reach. Will continue to monitor patient

## 2022-04-19 NOTE — Progress Notes (Signed)
Acid bath changed to 3K 2.5Ca per providers orders. BP 103/62.

## 2022-04-19 NOTE — Progress Notes (Signed)
[facility] Progress Note   Subjective:    Seen and examined patient on HD. Noted soft Bps (SBPs in 80s) but asymptomatic. On low UF and tolerating well.  Objective Vitals:   04/19/22 0930 04/19/22 0939 04/19/22 1000 04/19/22 1030  BP: (!) 89/56 103/62 101/67 99/66  Pulse: 82 84 86 81  Resp: 18 17 18 20   Temp:      TempSrc:      SpO2:  100%    Weight:       Physical Exam General: Elderly woman; frail and ill-appearing; on RA; NAD Heart: S1 and S2; No murmurs, gallops, or rubs Lungs:Clear anteriorly Abdomen: Soft and non-tender Extremities: R BKA; No edema LLE Dialysis Access: RIJ TDC; L AVF (+) B   Filed Weights   04/16/22 0500 04/17/22 0209 04/17/22 0814  Weight: 45.1 kg 46.7 kg 46.6 kg    Intake/Output Summary (Last 24 hours) at 04/19/2022 1109 Last data filed at 04/19/2022 0600 Gross per 24 hour  Intake 318.08 ml  Output 1300 ml  Net -981.92 ml    Additional Objective Labs: Basic Metabolic Panel: Recent Labs  Lab 04/16/22 0506 04/17/22 0106 04/17/22 2200 04/18/22 1246 04/19/22 0512  NA 126* 126* 131* 129* 130*  K 3.7 3.0* 2.7* 3.5 2.9*  CL 99 100 101 103 106  CO2 21* 20* 24 21* 20*  GLUCOSE 222* 74 84 117* 86  BUN 18 20 9 9 10   CREATININE 2.32* 2.54* 1.59* 1.84* 2.15*  CALCIUM 7.1* 7.3* 7.1* 7.6* 7.9*  PHOS 2.6 2.1*  --   --   --    Liver Function Tests: Recent Labs  Lab 04/15/22 1412 04/17/22 0106  AST 24 15  ALT 7 11  ALKPHOS 98 74  BILITOT 0.7 0.5  PROT 4.6* 4.0*  ALBUMIN <1.5* <1.5*   No results for input(s): "LIPASE", "AMYLASE" in the last 168 hours. CBC: Recent Labs  Lab 04/15/22 1412 04/15/22 1427 04/15/22 1844 04/16/22 0506 04/17/22 0106  WBC 18.2*  --  22.9* 21.9* 13.7*  NEUTROABS 16.7*  --   --   --   --   HGB 10.7*   < > 9.5* 10.0* 8.6*  HCT 33.0*   < > 27.1* 28.8* 24.1*  MCV 96.8  --  89.1 89.4 88.9  PLT 155  --  158 185 151   < > = values in this interval not displayed.   Blood Culture    Component  Value Date/Time   SDES BLOOD SITE NOT SPECIFIED 04/15/2022 1552   SPECREQUEST  04/15/2022 1552    BOTTLES DRAWN AEROBIC AND ANAEROBIC Blood Culture adequate volume   CULT  04/15/2022 1552    NO GROWTH 4 DAYS Performed at [facility], [place] [address], [place], [place] [address]    REPTSTATUS PENDING 04/15/2022 1552    Cardiac Enzymes: No results for input(s): "CKTOTAL", "CKMB", "CKMBINDEX", "TROPONINI" in the last 168 hours. CBG: Recent Labs  Lab 04/18/22 1121 04/18/22 1522 04/18/22 2156 04/19/22 0152 04/19/22 0739  GLUCAP 114* 181* 104* 89 65*   Iron Studies: No results for input(s): "IRON", "TIBC", "TRANSFERRIN", "FERRITIN" in the last 72 hours. Lab Results  Component Value Date   INR 1.4 (H) 04/15/2022   Studies/Results: No results found.  Medications:  albumin human Stopped (04/17/22 1100)   ceFEPime (MAXIPIME) IV 1 g (04/19/22 0100)    Chlorhexidine Gluconate Cloth  6 each Topical Q0600   Chlorhexidine Gluconate Cloth  6 each Topical Q0600   Chlorhexidine Gluconate  Cloth  6 each Topical Q0600   Gerhardt's butt cream   Topical QID   heparin  2,000 Units Dialysis Once in dialysis   heparin  5,000 Units Subcutaneous Q8H   insulin aspart  0-6 Units Subcutaneous TID WC   midodrine  10 mg Oral TID WC   mupirocin ointment  1 Application Nasal BID   potassium chloride  40 mEq Oral Once   psyllium  1 packet Oral Daily   sodium chloride flush  10-40 mL Intracatheter Q12H    Dialysis Orders: [facility]  3.5h  400/600  42.5kg  3K/2.5Ca bath Heparin 2000   RIJ TDC/ LUE AVF (1st stage BB AVF on 6/21) - last HD 7/24, post wt 46.0kg - last Hb 10.8 on 7/24 - no esa, vdra or Fe  Assessment/Plan: Sepsis/ shock/ HCAP - getting IV abx, pressors off. On Midodrine.  ESRD - on HD M-F 2x per week. While here we will run her MWF. Started HD in April 2023. On HD.  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 Will follow.  MBD ckd - CCa in range, phos is low. Not getting any vdra or sensipar. Hold any binders given low phos.  HTN - holding home meds Hx of dementia Hypocalcemia - Ca 7.1, corrects to 9.1 which is in range.  Hypomagnesemia - getting replacement Hypokalemia - use 4K bath w/ HD FTT - pt is very frail , on HD for 2-3 months. Palliative care team consulted for [place].   Tobie Poet, NP [facility] 04/19/2022,11:09 AM  LOS: 4 days

## 2022-04-19 NOTE — Progress Notes (Addendum)
[facility]                                                                              Molly Arroyo, is a 76 y.o. 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  In ED, patient was profoundly hypotensive with blood pressure 79/40, mildly hypothermic with temperature 96.6 F.  Creatinine 2.15, albumin less than 1.5, sodium 134, potassium 3, WBC 18.2, hemoglobin 10.7. CT chest obtained showed right lower lobe and right middle lobe pneumonia.  CT head negative.  Patient was placed on vasopressors for severe sepsis and admitted to ICU 7/27: Patient transferred to telemetry, TR[facility] assumed care  Assessment & Plan    Principal Problem: Severe sepsis ([place]) with septic shock secondary to multifocal pneumonia -Patient presented with hypotension, hypothermia, leukocytosis, lactic acidosis, CT chest with multifocal pneumonia/HCAP -Blood cultures negative so far, currently off vasopressors. -Sepsis physiology improving, continue midodrine -Vancomycin stopped, continue cefepime 5 days  Active Problems: ESRD on HD complicated with hypotension, hypoalbuminemia -Neurology following, HD per renal -Continue midodrine  Hypokalemia -Potassium 2.9, nephrology following, corrected with HD  Diabetes mellitus type 2, uncontrolled, with hypoglycemia ESRD Recent Labs    04/19/22 1232 04/19/22 1253 04/19/22 1302 04/19/22 1312 04/19/22 1320 04/19/22 1417  GLUCAP <10* 45* 69* 72 85 145*   - Currently on very sensitive sliding scale insulin  Episode of SVT on  7/26 -Resolved, back in NSR  Anemia of chronic disease/ESRD -H&H currently stable, monitor counts  Somnolent, severe debility, dementia -Discontinue trazodone, palliative medicine consulted for goals of care    Pressure injury of skin, sacral wound, POA Pressure Injury 04/15/22 Sacrum Mid Stage 2 -  Partial thickness loss of dermis presenting as a shallow open injury with a red, pink wound bed without slough. (Active)  04/15/22 1856  Location: Sacrum  Location Orientation: Mid  Staging: Stage 2 -  Partial thickness loss of dermis presenting as a shallow open injury with a red, pink wound bed without slough.  Wound Description (Comments):   Present on Admission: Yes  Dressing Type Foam - Lift dressing to assess site every shift 04/19/22 0815     Pressure Injury 04/15/22 Buttocks Right Stage 2 -  Partial thickness loss of dermis presenting as a shallow open injury with a red, pink wound bed without slough. skin broken (Active)  04/15/22 1857  Location: Buttocks  Location Orientation: Right  Staging: Stage 2 -  Partial thickness loss of dermis presenting as a shallow open injury with a red, pink wound bed without slough.  Wound Description (Comments): skin broken  Present on Admission: Yes  Dressing Type Foam - Lift dressing to assess site every shift 04/19/22 0815  Moderate protein calorie malnutrition, hypoalbuminemia less than 1.5 Estimated body mass index is 16.65 kg/m as calculated from the following:   Height as of 03/13/22: 5\' 2"  (1.575 m).   Weight as of this encounter: 41.3 kg.  Code Status: Full CODE STATUS DVT Prophylaxis:  heparin injection 5,000 Units Start: 04/15/22 2200 SCDs Start: 04/15/22 1730   Level of Care: Level of care: Telemetry Medical Family Communication:  No family at the bedside   Disposition Plan:      Remains inpatient appropriate   Procedures:  HD  Consultants:   Patient was admitted by [facility] Nephrology Vascular surgery  Antimicrobials:    Anti-infectives (From admission, onward)    Start     Dose/Rate Route Frequency Ordered Stop   04/17/22 1700  vancomycin (VANCOREADY) IVPB 500 mg/100 mL  Status:  Discontinued        500 mg 100 mL/hr over 60 Minutes Intravenous Every 48 hours 04/15/22 1816 04/15/22 1817   04/16/22 2200  ceFEPIme (MAXIPIME) 1 g in sodium chloride 0.9 % 100 mL IVPB        1 g 200 mL/hr over 30 Minutes Intravenous Every 24 hours 04/15/22 1818 04/21/22 2159   04/16/22 1600  ceFEPIme (MAXIPIME) 2 g in sodium chloride 0.9 % 100 mL IVPB  Status:  Discontinued        2 g 200 mL/hr over 30 Minutes Intravenous Every 24 hours 04/15/22 1816 04/15/22 1818   04/16/22 0802  vancomycin variable dose per unstable renal function (pharmacist dosing)  Status:  Discontinued         Does not apply See admin instructions 04/16/22 0803 04/17/22 1009   04/15/22 1445  ceFEPIme (MAXIPIME) 2 g in sodium chloride 0.9 % 100 mL IVPB        2 g 200 mL/hr over 30 Minutes Intravenous  Once 04/15/22 1438 04/15/22 1703   04/15/22 1445  metroNIDAZOLE (FLAGYL) IVPB 500 mg        500 mg 100 mL/hr over 60 Minutes Intravenous  Once 04/15/22 1438 04/15/22 1703   04/15/22 1445  vancomycin (VANCOCIN) IVPB 1000 mg/200 mL premix        1,000 mg 200 mL/hr over 60 Minutes Intravenous  Once 04/15/22 1438 04/15/22 1821          Medications  Chlorhexidine Gluconate Cloth  6 each Topical Q0600   Chlorhexidine Gluconate Cloth  6 each Topical Q0600   Chlorhexidine Gluconate Cloth  6 each Topical Q0600   Gerhardt's butt cream   Topical QID   heparin  2,000 Units Dialysis Once in dialysis   heparin  5,000 Units Subcutaneous Q8H   insulin aspart  0-6 Units Subcutaneous TID WC   midodrine  10 mg Oral TID WC   mupirocin ointment  1 Application Nasal BID   psyllium  1 packet Oral Daily   sodium chloride flush  10-40 mL Intracatheter Q12H      Subjective:   Sonna Lipsky was seen and examined today.  Hard of hearing, noted to be somewhat  somnolent this a.m., easily arousable and following commands.  Having hypoglycemia.  Objective:   Vitals:   04/19/22 1100 04/19/22 1130 04/19/22 1209 04/19/22 1421  BP: (!) 80/58 108/75  100/66  Pulse: 92 96  60  Resp: 19 20  20   Temp:    97.8 F (36.6 C)  TempSrc:    Oral  SpO2:    92%  Weight:   41.3 kg     Intake/Output Summary (Last  24 hours) at 04/19/2022 1507 Last data filed at 04/19/2022 1146 Gross per 24 hour  Intake 318.08 ml  Output 1100 ml  Net -781.92 ml     Wt Readings from Last 3 Encounters:  04/19/22 41.3 kg  03/13/22 44.9 kg  02/26/22 44 kg    Physical Exam General: Somnolent but arousable, frail and ill-appearing Cardiovascular: S1 S2 clear, RRR.  Respiratory: CTAB Gastrointestinal: Soft, nontender, nondistended, NBS Ext: right BKA, no edema LLE Neuro: no new deficits Psych: somnolent    Data Reviewed:  I have personally reviewed following labs    CBC Lab Results  Component Value Date   WBC 13.7 (H) 04/17/2022   RBC 2.71 (L) 04/17/2022   HGB 8.6 (L) 04/17/2022   HCT 24.1 (L) 04/17/2022   MCV 88.9 04/17/2022   MCH 31.7 04/17/2022   PLT 151 04/17/2022   MCHC 35.7 04/17/2022   RDW 17.3 (H) 04/17/2022   LYMPHSABS 0.7 04/15/2022   MONOABS 0.6 04/15/2022   EOSABS 0.1 04/15/2022   BASOSABS 0.1 50/35/4656     Last metabolic panel Lab Results  Component Value Date   NA 130 (L) 04/19/2022   K 2.9 (L) 04/19/2022   CL 106 04/19/2022   CO2 20 (L) 04/19/2022   BUN 10 04/19/2022   CREATININE 2.15 (H) 04/19/2022   GLUCOSE 86 04/19/2022   GFRNONAA 23 (L) 04/19/2022   CALCIUM 7.9 (L) 04/19/2022   PHOS 2.1 (L) 04/17/2022   PROT 4.0 (L) 04/17/2022   ALBUMIN <1.5 (L) 04/17/2022   LABGLOB 2.9 02/13/2021   AGRATIO 1.0 02/13/2021   BILITOT 0.5 04/17/2022   ALKPHOS 74 04/17/2022   AST 15 04/17/2022   ALT 11 04/17/2022   ANIONGAP 4 (L) 04/19/2022    CBG (last 3)  Recent Labs    04/19/22 1312 04/19/22 1320 04/19/22 1417  GLUCAP 72 85  145*      Coagulation Profile: Recent Labs  Lab 04/15/22 1418  INR 1.4*      Rosene Pilling M.D. [facility] 04/19/2022, 3:07 PM  Available via Epic secure chat 7am-7pm After 7 pm, please refer to night coverage provider listed on amion.

## 2022-04-19 NOTE — Progress Notes (Signed)
Received patient in bed, alert and oriented. Informed consent signed and in chart.  Tx duration:  1146  HD treatment completed. Treatment not tolerated as BP was between 80/58 and 108/75. Stopped fluid removal midway to keep hemodynamically stable. Patient was asymptomatic during treatment. Right HD catheter without signs and symptoms of complications. Patient transported back to the room, alert and orient and in no acute distress. Report given to bedside RN.  Total UF removed: 565mL  Medication given: n/a   Post HD VS: 122/79, 84, 20, 97.7, 100%.   Post HD weight: 41.3kg

## 2022-04-19 NOTE — Progress Notes (Signed)
Patients blood sugar when arriving back to unit from dialysis was 45 @12 :53. Rai,MD paged and notified. Orange juice and food given to patient. Patients blood sugar went up to 85 @ 13:20. Rai,MD made aware.

## 2022-04-19 NOTE — Progress Notes (Signed)
Physical Therapy Treatment Patient Details Name: Molly Arroyo MRN: [id number] DOB: [birth year] Today's Date: 04/19/2022   History of Present Illness pt is a 76 y/o female admitted 7/24 to the ED from HD center after becoming minimally responsive with hypotension during HD.  In ED pt profoundly hypotensive and mildly hypothermic.  CT of chest revealed R LL and middle lobe PNA.  Pt treated for sepsis.  PMHx:  ESRD on HD Monday and Friday, type 2 diabetes, s/p BKA secondary to diabetic foot ulcer hypertension, and hypothyroidism    PT Comments    Received pt semi-reclined in bed asleep with RN checking blood glucose levels. Pt extremely lethargic this morning and with difficulty sustaining arousal. Pt initially agreed to transferring into recliner to eat breakfast, but upon removing bedsheets pt found completely saturated. Pt declined sitting EOB or standing to change sheets therefore required max A to roll in bed and +2 assist from NT for hygiene and to change linens. Pt with little/no initiation or participation and falling in/out sleep entire time. MD arrived then transport arrived to take pt to HD. Acute PT to cont to follow.    Recommendations for follow up therapy are one component of a multi-disciplinary discharge planning process, led by the attending physician.  Recommendations may be updated based on patient status, additional functional criteria and insurance authorization.  Follow Up Recommendations  Skilled nursing-short term rehab (<3 hours/day) Can patient physically be transported by private vehicle: No   Assistance Recommended at Discharge Frequent or constant Supervision/Assistance  Patient can return home with the following A lot of help with walking and/or transfers;A little help with bathing/dressing/bathroom;Assistance with cooking/housework;Direct supervision/assist for medications management;Direct supervision/assist for financial management;Assist for transportation;Help  with stairs or ramp for entrance   Equipment Recommendations  Other (comment) (TBD)    Recommendations for Other Services       Precautions / Restrictions Precautions Precautions: Fall Restrictions Weight Bearing Restrictions: No     Mobility  Bed Mobility Overal bed mobility: Needs Assistance Bed Mobility: Rolling Rolling: Max assist         General bed mobility comments: pt refused sitting EOB or OOB mobility. Upon removing sheets, pt found to be completly saturated in urine and required convincing to allow therapist and NT to assist with hygeine. Pt with no awareness of incontinence and little/no participation or initation in getting cleaned up Patient Response: Flat affect (lethargic)  Transfers                   General transfer comment: pt deferred standing attempts or OOB to chair    Ambulation/Gait               General Gait Details: did not perform due to lethargy   Stairs             Wheelchair Mobility    Modified Rankin (Stroke Patients Only)       Balance       Sitting balance - Comments: did not assess due to refusal to sit EOB                                    Cognition Arousal/Alertness: Lethargic Behavior During Therapy: Flat affect Overall Cognitive Status: No family/caregiver present to determine baseline cognitive functioning  General Comments: Pt with difficulty sustaining arousal this morning. Pt only answered therapist's questions 25% of the time and with difficulty following commands (suspect due to lethargy)        Exercises      General Comments General comments (skin integrity, edema, etc.): session limited by lethargy, MD and RN/NT entering, and pt being transported to HD      Pertinent Vitals/Pain Pain Assessment Pain Assessment: Faces Faces Pain Scale: Hurts a little bit Pain Location: rectum with peri-care Pain Descriptors /  Indicators: Grimacing, Moaning Pain Intervention(s): Limited activity within patient's tolerance, Monitored during session, Repositioned    Home Living                          Prior Function            PT Goals (current goals can now be found in the care plan section) Acute Rehab PT Goals Patient Stated Goal: pt did not relate her goals PT Goal Formulation: Patient unable to participate in goal setting Time For Goal Achievement: 05/02/22 Potential to Achieve Goals: Fair    Frequency    Min 3X/week      PT Plan Current plan remains appropriate    Co-evaluation              AM-PAC PT "6 Clicks" Mobility   Outcome Measure                   End of Session   Activity Tolerance: Patient limited by lethargy Patient left: in bed;with nursing/sitter in room Nurse Communication: Mobility status PT Visit Diagnosis: Other abnormalities of gait and mobility (R26.89);Muscle weakness (generalized) (M62.81);Unsteadiness on feet (R26.81);Pain Pain - part of body:  (buttocks)     Time: 8110-3159 PT Time Calculation (min) (ACUTE ONLY): 28 min  Charges:  $Therapeutic Activity: 23-37 mins                     Becky Sax PT, DPT  Blenda Nicely 04/19/2022, 9:07 AM

## 2022-04-19 NOTE — Consult Note (Signed)
Consultation Note Date: 04/19/2022   Patient Name: Molly Arroyo  DOB: 1946-01-09  MRN: [id number]  Age / Sex: 76 y.o., female  PCP: [name], MD Referring Physician: Mendel Corning, MD  Reason for Consultation: Establishing goals of care  HPI/Patient Profile: 76 y.o. 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   Clinical Assessment and Goals of Care: I have reviewed medical records including EPIC notes, labs and imaging, received report from RN, assessed the patient.  [name] is lying quietly in bed.  She greets me, making and somewhat keeping eye contact.  She appears acutely/chronically ill and very frail.  She has profound hearing loss.  She is alert and oriented x3, although she has a diagnosis of dementia.  She is able to make her basic needs known.  There is no family present at bedside at this time.  I ask Mrs. Smart how she is tolerating HD.  She states that she is doing "okay".  She is unable to give me particulars.  I asked what would happen if she were to stop HD,  she tells me, "I don't know".  [name] asks for something to drink.  She is able to take a sip of water without overt signs and symptoms of aspiration.  She tells me that she is cold, I rearrange her blankets and increase the temperature in the room.  Although she is alert and oriented, with a known diagnosis of dementia, I do not believe that she is capable of making complex decisions.  Call to daughter, Clinton Sawyer, to discuss diagnosis prognosis, GOC, EOL wishes, disposition and options.  No answer, left voicemail message.  Conference with attending, bedside  nursing staff, transition of care team related to patient condition, needs, goals of care, disposition.   HCPOA NEXT OF KIN -only contact listed is daughter, Clinton Sawyer.    SUMMARY OF RECOMMENDATIONS   At this point continue full scope/full code by default. Time for outcomes. PMT to continue to follow    Code Status/Advance Care Planning: Full code -unable to have [place] discussions with patient due to diagnosis of dementia.  Left message for daughter.  Symptom Management:  Per hospitalist, no additional needs at this time.  Palliative Prophylaxis:  Frequent Pain Assessment, Oral Care, and Turn Reposition  Additional Recommendations (Limitations, Scope, Preferences): Full Scope Treatment  Psycho-social/Spiritual:  Desire for further Chaplaincy support:yes Additional Recommendations: Caregiving  Support/Resources and Education on Hospice  Prognosis:  Unable to determine, based on outcomes.  Extremely frail.  If stop HD, 2 weeks or less.  It seems that even with full scope/full code and continuing HD 2 to 3 months anticipated.  Discharge Planning:  To be determined, based on outcomes.  Anticipate return to [facility] under long-term care  versus residential hospice which would greatly benefit her for comfort and dignity.       Primary Diagnoses: Present on Admission: **None**   I have reviewed the medical record, interviewed the patient and family, and examined the patient. The following aspects are pertinent.  Past Medical History:  Diagnosis Date   Diabetes mellitus without complication ([place])    ESRD on hemodialysis ([place])    on dialysis   Hypertension    Hypothyroidism    Thyroid disease    Social History   Socioeconomic History   Marital status: Divorced    Spouse name: Not on file   Number of children: Not on file   Years of education: Not on file   Highest education level: Not on file  Occupational History   Not on file  Tobacco Use   Smoking  status: Never   Smokeless tobacco: Never  Vaping Use   Vaping Use: Never used  Substance and Sexual Activity   Alcohol use: Never   Drug use: Never   Sexual activity: Not Currently    Birth control/protection: Post-menopausal  Other Topics Concern   Not on file  Social History Narrative   Not on file   Social Determinants of Health   Financial Resource Strain: Not on file  Food Insecurity: Not on file  Transportation Needs: Not on file  Physical Activity: Not on file  Stress: Not on file  Social Connections: Not on file   History reviewed. No pertinent family history. Scheduled Meds:  Chlorhexidine Gluconate Cloth  6 each Topical Q0600   Chlorhexidine Gluconate Cloth  6 each Topical Q0600   Chlorhexidine Gluconate Cloth  6 each Topical Q0600   Gerhardt's butt cream   Topical QID   heparin  2,000 Units Dialysis Once in dialysis   heparin  5,000 Units Subcutaneous Q8H   insulin aspart  0-6 Units Subcutaneous TID WC   midodrine  10 mg Oral TID WC   mupirocin ointment  1 Application Nasal BID   psyllium  1 packet Oral Daily   sodium chloride flush  10-40 mL Intracatheter Q12H   Continuous Infusions:  albumin human Stopped (04/17/22 1100)   ceFEPime (MAXIPIME) IV 1 g (04/19/22 0100)   PRN Meds:.acetaminophen, albumin human, docusate sodium, ondansetron (ZOFRAN) IV, polyethylene glycol, sodium chloride flush Medications Prior to Admission:  Prior to Admission medications   Medication Sig Start Date End Date Taking? Authorizing Provider  acetaminophen (TYLENOL) 325 MG tablet Take 2 tablets (650 mg total) by mouth every 6 (six) hours as needed for mild pain, moderate pain or headache (or Fever >/= 101). Patient taking differently: Take 325 mg by mouth every 6 (six) hours as needed (pain). 02/14/21  Yes Jeralyn Bennett, MD  amLODipine (NORVASC) 10 MG tablet Take 1 tablet (10 mg total) by mouth daily. 02/14/21  Yes Jeralyn Bennett, MD  atenolol (TENORMIN) 50 MG tablet Take 25 mg  by mouth daily.   Yes [provider]  B Complex-C-Folic Acid (RENA-VITE PO) Take 1 tablet by mouth daily.   Yes [provider]  busPIRone (BUSPAR) 10 MG tablet Take 15 mg by mouth 3 (three) times daily. 07/18/21  Yes [provider]  Cholecalciferol (VITAMIN D3) 1.25 MG (50000 UT) CAPS Take 1 capsule by mouth every Sunday.   Yes [provider]  clopidogrel (PLAVIX) 75 MG tablet Take 75 mg by mouth daily. 01/18/21  Yes [provider]  Dextrose, Diabetic Use, (INSTA-GLUCOSE) 77.4 % GEL Take 1 Dose by mouth  as needed (BG < 70).   Yes [provider]  furosemide (LASIX) 20 MG tablet Take 1 tablet (20 mg total) by mouth daily as needed for edema (for weight gain >3 lbs in 1 day of >5lbs in 1 week or worsening lower extremity swelling / shortness of breath laying down). Patient taking differently: Take 20 mg by mouth daily. 02/14/21  Yes Jeralyn Bennett, MD  Glucagon, rDNA, (GLUCAGON EMERGENCY) 1 MG KIT Inject 1 mg into the muscle as needed (BG < 70).   Yes [provider]  HYDROcodone-acetaminophen (NORCO) 5-325 MG tablet Take 1 tablet by mouth every 6 (six) hours as needed for moderate pain. 03/13/22  Yes Dagoberto Ligas, PA-C  Nutritional Supplements (NUTRITIONAL DRINK PO) Take 1 Dose by mouth 2 (two) times daily. House supplement   Yes [provider]  Nutritional Supplements (NUTRITIONAL DRINK PO) Take 1 Dose by mouth in the morning and at bedtime. Protein liquid   Yes [provider]  ondansetron (ZOFRAN) 4 MG tablet Take 4 mg by mouth every 6 (six) hours as needed for nausea or vomiting.   Yes [provider]  PARoxetine (PAXIL) 40 MG tablet Take 40 mg by mouth daily. 03/08/22  Yes [provider]  rOPINIRole (REQUIP) 1 MG tablet Take 1 mg by mouth at bedtime.   Yes [provider]  sevelamer carbonate (RENVELA) 800 MG tablet Take 800 mg by mouth 3 (three) times daily with meals.   Yes  [provider]  TRADJENTA 5 MG TABS tablet Take 5 mg by mouth daily. 03/05/22  Yes [provider]  traZODone (DESYREL) 50 MG tablet Take 50 mg by mouth at bedtime.   Yes [provider]  vitamin B-12 (CYANOCOBALAMIN) 500 MCG tablet Take 500 mcg by mouth daily. 03/20/21  Yes [provider]  Zinc 50 MG TABS Take 50 mg by mouth daily.   Yes [provider]   No Known Allergies Review of Systems  Unable to perform ROS: Dementia    Physical Exam Vitals and nursing note reviewed.  Constitutional:      General: She is not in acute distress.    Appearance: She is ill-appearing.  Cardiovascular:     Rate and Rhythm: Normal rate.  Pulmonary:     Effort: Pulmonary effort is normal. No respiratory distress.  Musculoskeletal:     Comments: Cachectic, frail  Skin:    General: Skin is warm and dry.     Findings: Bruising present.  Neurological:     Mental Status: She is alert and oriented to person, place, and time.  Psychiatric:        Mood and Affect: Mood normal.        Behavior: Behavior normal.     Vital Signs: BP 96/61 (BP Location: Right Arm)   Pulse 79   Temp 98.1 F (36.7 C) (Oral)   Resp 16   Wt 41.3 kg   SpO2 92%   BMI 16.65 kg/m  Pain Scale: 0-10   Pain Score: 0-No pain   SpO2: SpO2: 92 % O2 Device:SpO2: 92 % O2 Flow Rate: .   IO: Intake/output summary:  Intake/Output Summary (Last 24 hours) at 04/19/2022 1549 Last data filed at 04/19/2022 1300 Gross per 24 hour  Intake 558.08 ml  Output 2100 ml  Net -1541.92 ml    LBM: Last BM Date : 04/18/22 Baseline Weight: Weight: 45.1 kg Most recent weight: Weight: 41.3 kg     Palliative Assessment/Data:   Flowsheet  Rows    Flowsheet Row Most Recent Value  Intake Tab   Referral Department Hospitalist  Unit at Time of Referral Cardiac/Telemetry Unit  Palliative Care Primary Diagnosis Nephrology  Date Notified 04/18/22  Palliative Care Type New Palliative care   Reason for referral Clarify Goals of Care  Date of Admission 04/15/22  Date first seen by Palliative Care 04/19/22  # of days Palliative referral response time 1 Day(s)  # of days IP prior to Palliative referral 3  Clinical Assessment   Palliative Performance Scale Score 30%  Pain Max last 24 hours Not able to report  Pain Min Last 24 hours Not able to report  Dyspnea Max Last 24 Hours Not able to report  Dyspnea Min Last 24 hours Not able to report  Psychosocial & Spiritual Assessment   Palliative Care Outcomes        Time In: 1400 Time Out: 1455 Time Total: 55 minutes  Greater than 50%  of this time was spent counseling and coordinating care related to the above assessment and plan.  Signed by: Drue Novel, NP   Please contact Palliative Medicine Team phone at [phone] for questions and concerns.  For individual provider: See Shea Evans

## 2022-04-20 DIAGNOSIS — N186 End stage renal disease: Secondary | ICD-10-CM | POA: Diagnosis not present

## 2022-04-20 DIAGNOSIS — Z7189 Other specified counseling: Secondary | ICD-10-CM | POA: Diagnosis not present

## 2022-04-20 DIAGNOSIS — J189 Pneumonia, unspecified organism: Secondary | ICD-10-CM | POA: Diagnosis not present

## 2022-04-20 DIAGNOSIS — A419 Sepsis, unspecified organism: Secondary | ICD-10-CM | POA: Diagnosis not present

## 2022-04-20 DIAGNOSIS — R6521 Severe sepsis with septic shock: Secondary | ICD-10-CM | POA: Diagnosis not present

## 2022-04-20 DIAGNOSIS — Z515 Encounter for palliative care: Secondary | ICD-10-CM | POA: Diagnosis not present

## 2022-04-20 LAB — BASIC METABOLIC PANEL
Anion gap: 5 (ref 5–15)
BUN: 7 mg/dL — ABNORMAL LOW (ref 8–23)
CO2: 24 mmol/L (ref 22–32)
Calcium: 7.6 mg/dL — ABNORMAL LOW (ref 8.9–10.3)
Chloride: 103 mmol/L (ref 98–111)
Creatinine, Ser: 1.66 mg/dL — ABNORMAL HIGH (ref 0.44–1.00)
GFR, Estimated: 32 mL/min — ABNORMAL LOW (ref >60)
Glucose, Bld: 155 mg/dL — ABNORMAL HIGH (ref 70–99)
Potassium: 3.7 mmol/L (ref 3.5–5.1)
Sodium: 132 mmol/L — ABNORMAL LOW (ref 135–145)

## 2022-04-20 LAB — CULTURE, BLOOD (ROUTINE X 2)
Culture: NO GROWTH
Culture: NO GROWTH
Special Requests: ADEQUATE
Special Requests: ADEQUATE

## 2022-04-20 LAB — GLUCOSE, CAPILLARY
Glucose-Capillary: 116 mg/dL — ABNORMAL HIGH (ref 70–99)
Glucose-Capillary: 124 mg/dL — ABNORMAL HIGH (ref 70–99)
Glucose-Capillary: 142 mg/dL — ABNORMAL HIGH (ref 70–99)
Glucose-Capillary: 151 mg/dL — ABNORMAL HIGH (ref 70–99)
Glucose-Capillary: 186 mg/dL — ABNORMAL HIGH (ref 70–99)
Glucose-Capillary: 90 mg/dL (ref 70–99)

## 2022-04-20 LAB — PHOSPHORUS: Phosphorus: 4.1 mg/dL (ref 2.5–4.6)

## 2022-04-20 NOTE — Progress Notes (Signed)
[facility]                                                                              Molly Arroyo, is a 76 y.o. 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  In ED, patient was profoundly hypotensive with blood pressure 79/40, mildly hypothermic with temperature 96.6 F.  Creatinine 2.15, albumin less than 1.5, sodium 134, potassium 3, WBC 18.2, hemoglobin 10.7. CT chest obtained showed right lower lobe and right middle lobe pneumonia.  CT head negative.  Patient was placed on vasopressors for severe sepsis and admitted to ICU 7/27: Patient transferred to telemetry, TR[facility] assumed care  Assessment & Plan    Principal Problem: Severe sepsis ([place]) with septic shock secondary to multifocal pneumonia -Patient presented with hypotension, hypothermia, leukocytosis, lactic acidosis, CT chest with multifocal pneumonia/HCAP -Blood cultures negative so far, currently off vasopressors. -Sepsis physiology improving, continue midodrine -Vancomycin stopped, continue cefepime 5 days  Active Problems: ESRD on HD complicated with hypotension, hypoalbuminemia -Neurology following, HD per renal -Continue midodrine  Hypokalemia -Corrected with HD  Diabetes mellitus type 2, uncontrolled, with hypoglycemia ESRD Recent Labs    04/19/22 1625 04/19/22 2107 04/20/22 0011 04/20/22 0551 04/20/22 0756 04/20/22 1201  GLUCAP 199* 216* 186* 151* 124* 90   - Currently on very sensitive sliding scale insulin  Episode of SVT on 7/26 -Resolved, back in  NSR  Anemia of chronic disease/ESRD -H&H currently stable, monitor counts  Somnolent, severe debility, dementia -Discontinued trazodone, palliative medicine consulted for goals of care    Pressure injury of skin, sacral wound, POA Pressure Injury 04/15/22 Sacrum Mid Stage 2 -  Partial thickness loss of dermis presenting as a shallow open injury with a red, pink wound bed without slough. (Active)  04/15/22 1856  Location: Sacrum  Location Orientation: Mid  Staging: Stage 2 -  Partial thickness loss of dermis presenting as a shallow open injury with a red, pink wound bed without slough.  Wound Description (Comments):   Present on Admission: Yes  Dressing Type Foam - Lift dressing to assess site every shift 04/20/22 0900     Pressure Injury 04/15/22 Buttocks Right Stage 2 -  Partial thickness loss of dermis presenting as a shallow open injury with a red, pink wound bed without slough. skin broken (Active)  04/15/22 1857  Location: Buttocks  Location Orientation: Right  Staging: Stage 2 -  Partial thickness loss of dermis presenting as a shallow open injury with a red, pink wound bed without slough.  Wound Description (Comments): skin broken  Present on Admission: Yes  Dressing Type Foam - Lift dressing to assess site every shift 04/20/22 0900   Moderate protein calorie  malnutrition, hypoalbuminemia less than 1.5 Estimated body mass index is 16.65 kg/m as calculated from the following:   Height as of 03/13/22: 5\' 2"  (1.575 m).   Weight as of this encounter: 41.3 kg.  Code Status: Full CODE STATUS DVT Prophylaxis:  heparin injection 5,000 Units Start: 04/15/22 2200 SCDs Start: 04/15/22 1730   Level of Care: Level of care: Telemetry Medical Family Communication:  No family at the bedside   Disposition Plan:      Remains inpatient appropriate   Procedures:  HD  Consultants:   Patient was admitted by [facility]ph[facility] Vascular surgery  Antimicrobials:   Anti-infectives  (From admission, onward)    Start     Dose/Rate Route Frequency Ordered Stop   04/17/22 1700  vancomycin (VANCOREADY) IVPB 500 mg/100 mL  Status:  Discontinued        500 mg 100 mL/hr over 60 Minutes Intravenous Every 48 hours 04/15/22 1816 04/15/22 1817   04/16/22 2200  ceFEPIme (MAXIPIME) 1 g in sodium chloride 0.9 % 100 mL IVPB        1 g 200 mL/hr over 30 Minutes Intravenous Every 24 hours 04/15/22 1818 04/21/22 2159   04/16/22 1600  ceFEPIme (MAXIPIME) 2 g in sodium chloride 0.9 % 100 mL IVPB  Status:  Discontinued        2 g 200 mL/hr over 30 Minutes Intravenous Every 24 hours 04/15/22 1816 04/15/22 1818   04/16/22 0802  vancomycin variable dose per unstable renal function (pharmacist dosing)  Status:  Discontinued         Does not apply See admin instructions 04/16/22 0803 04/17/22 1009   04/15/22 1445  ceFEPIme (MAXIPIME) 2 g in sodium chloride 0.9 % 100 mL IVPB        2 g 200 mL/hr over 30 Minutes Intravenous  Once 04/15/22 1438 04/15/22 1703   04/15/22 1445  metroNIDAZOLE (FLAGYL) IVPB 500 mg        500 mg 100 mL/hr over 60 Minutes Intravenous  Once 04/15/22 1438 04/15/22 1703   04/15/22 1445  vancomycin (VANCOCIN) IVPB 1000 mg/200 mL premix        1,000 mg 200 mL/hr over 60 Minutes Intravenous  Once 04/15/22 1438 04/15/22 1821          Medications  Chlorhexidine Gluconate Cloth  6 each Topical Q0600   Chlorhexidine Gluconate Cloth  6 each Topical Q0600   Gerhardt's butt cream   Topical QID   heparin  2,000 Units Dialysis Once in dialysis   heparin  5,000 Units Subcutaneous Q8H   insulin aspart  0-6 Units Subcutaneous TID WC   midodrine  10 mg Oral TID WC   psyllium  1 packet Oral Daily   sodium chloride flush  10-40 mL Intracatheter Q12H      Subjective:   Molly Arroyo was seen and examined today.  Alert and awake, however not very convincing, difficult to obtain review of system from the patient.  No fevers or chills, no acute issues overnight.      Objective:   Vitals:   04/19/22 1538 04/19/22 2108 04/20/22 0553 04/20/22 0756  BP: 96/61 (!) 113/55 125/65 129/63  Pulse: 79 79 72 67  Resp: 16 16 16 19   Temp: 98.1 F (36.7 C) 98.1 F (36.7 C) 97.6 F (36.4 C) 98.1 F (36.7 C)  TempSrc: Oral   Oral  SpO2: 92% 98% 98% 93%  Weight:        Intake/Output Summary (Last 24 hours) at 04/20/2022  1440 Last data filed at 04/20/2022 0903 Gross per 24 hour  Intake 40 ml  Output --  Net 40 ml     Wt Readings from Last 3 Encounters:  04/19/22 41.3 kg  03/13/22 44.9 kg  02/26/22 44 kg   Physical Exam General: Alert and awake, frail and ill-appearing Cardiovascular: S1 S2 clear, RRR. Respiratory: CTAB, no wheezing Gastrointestinal: Soft, nontender, nondistended, NBS Ext: right BKA, no edema LLE Psych: flat affect    Data Reviewed:  I have personally reviewed following labs    CBC Lab Results  Component Value Date   WBC 13.7 (H) 04/17/2022   RBC 2.71 (L) 04/17/2022   HGB 8.6 (L) 04/17/2022   HCT 24.1 (L) 04/17/2022   MCV 88.9 04/17/2022   MCH 31.7 04/17/2022   PLT 151 04/17/2022   MCHC 35.7 04/17/2022   RDW 17.3 (H) 04/17/2022   LYMPHSABS 0.7 04/15/2022   MONOABS 0.6 04/15/2022   EOSABS 0.1 04/15/2022   BASOSABS 0.1 59/29/2446     Last metabolic panel Lab Results  Component Value Date   NA 132 (L) 04/20/2022   K 3.7 04/20/2022   CL 103 04/20/2022   CO2 24 04/20/2022   BUN 7 (L) 04/20/2022   CREATININE 1.66 (H) 04/20/2022   GLUCOSE 155 (H) 04/20/2022   GFRNONAA 32 (L) 04/20/2022   CALCIUM 7.6 (L) 04/20/2022   PHOS 4.1 04/20/2022   PROT 4.0 (L) 04/17/2022   ALBUMIN 2.4 (L) 04/19/2022   LABGLOB 2.9 02/13/2021   AGRATIO 1.0 02/13/2021   BILITOT 0.5 04/17/2022   ALKPHOS 74 04/17/2022   AST 15 04/17/2022   ALT 11 04/17/2022   ANIONGAP 5 04/20/2022    CBG (last 3)  Recent Labs    04/20/22 0551 04/20/22 0756 04/20/22 1201  GLUCAP 151* 124* 90      Coagulation Profile: Recent Labs  Lab  04/15/22 1418  INR 1.4*      Molly Arroyo M.D. [facility] 04/20/2022, 2:40 PM  Available via Epic secure chat 7am-7pm After 7 pm, please refer to night coverage provider listed on amion.

## 2022-04-20 NOTE — Progress Notes (Addendum)
[facility] Progress Note   Subjective:    Seen and examined patient at bedside. Currently resting. Denies SOB and CP. Ran even during yesterday's HD which was appropriate-she had low Bps throughout treatment. Patient seen by Palliative Care yesterday.  Objective Vitals:   04/19/22 1538 04/19/22 2108 04/20/22 0553 04/20/22 0756  BP: 96/61 (!) 113/55 125/65 129/63  Pulse: 79 79 72 67  Resp: 16 16 16 19   Temp: 98.1 F (36.7 C) 98.1 F (36.7 C) 97.6 F (36.4 C) 98.1 F (36.7 C)  TempSrc: Oral   Oral  SpO2: 92% 98% 98% 93%  Weight:       Physical Exam General: Elderly woman; frail and ill-appearing; on RA; NAD Heart: S1 and S2; No murmurs, gallops, or rubs Lungs:Clear anteriorly Abdomen: Soft and non-tender Extremities: R BKA; Trace edema noted L thigh Dialysis Access: RIJ TDC; L AVF (+) B  Filed Weights   04/17/22 0814 04/19/22 0822 04/19/22 1209  Weight: 46.6 kg 41.8 kg 41.3 kg    Intake/Output Summary (Last 24 hours) at 04/20/2022 1025 Last data filed at 04/20/2022 1610 Gross per 24 hour  Intake 280 ml  Output 500 ml  Net -220 ml    Additional Objective Labs: Basic Metabolic Panel: Recent Labs  Lab 04/17/22 0106 04/17/22 2200 04/19/22 0512 04/19/22 2000 04/20/22 0348 04/20/22 0822  NA 126*   < > 130* 132* 132*  --   K 3.0*   < > 2.9* 4.3 3.7  --   CL 100   < > 106 102 103  --   CO2 20*   < > 20* 19* 24  --   GLUCOSE 74   < > 86 244* 155*  --   BUN 20   < > 10 5* 7*  --   CREATININE 2.54*   < > 2.15* 1.54* 1.66*  --   CALCIUM 7.3*   < > 7.9* 7.8* 7.6*  --   PHOS 2.1*  --   --  <1.0*  --  4.1   < > = values in this interval not displayed.   Liver Function Tests: Recent Labs  Lab 04/15/22 1412 04/17/22 0106 04/19/22 2000  AST 24 15  --   ALT 7 11  --   ALKPHOS 98 74  --   BILITOT 0.7 0.5  --   PROT 4.6* 4.0*  --   ALBUMIN <1.5* <1.5* 2.4*   No results for input(s): "LIPASE", "AMYLASE" in the last 168 hours. CBC: Recent Labs   Lab 04/15/22 1412 04/15/22 1427 04/15/22 1844 04/16/22 0506 04/17/22 0106  WBC 18.2*  --  22.9* 21.9* 13.7*  NEUTROABS 16.7*  --   --   --   --   HGB 10.7*   < > 9.5* 10.0* 8.6*  HCT 33.0*   < > 27.1* 28.8* 24.1*  MCV 96.8  --  89.1 89.4 88.9  PLT 155  --  158 185 151   < > = values in this interval not displayed.   Blood Culture    Component Value Date/Time   SDES BLOOD SITE NOT SPECIFIED 04/15/2022 1552   SPECREQUEST  04/15/2022 1552    BOTTLES DRAWN AEROBIC AND ANAEROBIC Blood Culture adequate volume   CULT  04/15/2022 1552    NO GROWTH 5 DAYS Performed at [facility], [place] [address] [place], [place] [address]    REPTSTATUS 04/20/2022 FINAL 04/15/2022 1552    Cardiac Enzymes: No results for input(s): "CKTOTAL", "CKMB", "CKMBINDEX", "  TROPONINI" in the last 168 hours. CBG: Recent Labs  Lab 04/19/22 1625 04/19/22 2107 04/20/22 0011 04/20/22 0551 04/20/22 0756  GLUCAP 199* 216* 186* 151* 124*   Iron Studies: No results for input(s): "IRON", "TIBC", "TRANSFERRIN", "FERRITIN" in the last 72 hours. Lab Results  Component Value Date   INR 1.4 (H) 04/15/2022   Studies/Results: No results found.  Medications:  albumin human Stopped (04/17/22 1100)   ceFEPime (MAXIPIME) IV 1 g (04/19/22 2243)    Chlorhexidine Gluconate Cloth  6 each Topical Q0600   Chlorhexidine Gluconate Cloth  6 each Topical Q0600   Gerhardt's butt cream   Topical QID   heparin  2,000 Units Dialysis Once in dialysis   heparin  5,000 Units Subcutaneous Q8H   insulin aspart  0-6 Units Subcutaneous TID WC   midodrine  10 mg Oral TID WC   psyllium  1 packet Oral Daily   sodium chloride flush  10-40 mL Intracatheter Q12H    Dialysis Orders: [facility]  3.5h  400/600  42.5kg  3K/2.5Ca bath Heparin 2000   RIJ TDC/ LUE AVF (1st stage BB AVF on 6/21) - last HD 7/24, post wt 46.0kg - last Hb 10.8 on 7/24 - no esa, vdra or Fe  Assessment/Plan: Sepsis/ shock/ HCAP - getting IV abx, pressors  off. On Midodrine.  ESRD - on HD M-F 2x per week. While here we will run her MWF. Started HD in April 2023. Next HD 7/31.  Volume - some edema, unable to pull much volume due to shock/ 3rd spacing. She's now under her EDW after yesterday's tx-will need to run even for next treatment. 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 Will follow.  MBD ckd - CCa in range. Noted very low PO4 yesterday evening-Na phos IV given overnight. PO4 now at goal. Not getting any vdra or sensipar. Hold any binders given low phos.  HTN - holding home meds. On Midodrine Hx of dementia Hypocalcemia - Ca 7.1, corrects to 9.1 which is in range.  Hypomagnesemia - getting replacement Hypokalemia - KCL 51meq PO X 1 given yesterday for K+ 2.9. K+ now 3.7. Monitor trend closely. Continue 4K bath with HD. FTT - pt is very frail , on HD for 2-3 months. Palliative care team consulted for [place]. Reviewed note: patient not capable to make complex decisions d/t dementia. Daughter has been called to discuss [place]. Awaiting family meeting. Patient remains Full Code by default at this time. Patient severely frail, would support transition to hospice, appreciate palliative assistance.   Tobie Poet, NP [facility] 04/20/2022,10:25 AM  LOS: 5 days    Pt seen, examined and agree w assess/plan as above with additions as indicated.  [facility] 04/20/2022, 2:07 PM

## 2022-04-20 NOTE — Progress Notes (Signed)
Palliative: Molly Arroyo is sitting up quietly in bed.  She is resting comfortably, but wakes easily when I touch her.  She appears acutely/chronically ill and very frail.  She has extreme hearing loss and a diagnosis of dementia.  Although at times she can be alert and oriented x3, with a diagnosis of dementia she is not able to make complex decisions.  I believe that she can make her basic needs known.  There is no family present at bedside at this time.  I ask Molly Arroyo how she is tolerating dialysis.  She tells me, "not good".  I offer her something to drink, and she is able to take a sip with a straw without overt signs and symptoms of aspiration.  There is applesauce at bedside which she declines.  I ask if she is seeing her daughter and she tells me she has not.  I asked her daughter's name, she is able to tell me Robin.  I share that I will reach out to Molly Arroyo for an update.  She agrees.   Detail conference with bedside nursing staff related to patient condition, needs, resident of [facility] in [place].  Call to daughter, Molly Arroyo.  No answer.  Left generic voicemail message as voicemail is not identified by name. PMT to continue to attempt to reach responsible party/HCPOA.  Conference with attending, bedside nursing staff, transition of care team related to patient condition, needs, goals of care, disposition.  Plan: At this point continue full scope/full code by default.  Unable to have meaningful goals of care discussion with Molly Arroyo due to her diagnosis of dementia.  Unable to reach daughter, have requested a return call.  Long-term care resident at [facility] in [place].  Would benefit from outpatient palliative services if inpatient service is unable to reach daughter.  72 minutes  Quinn Axe, NP Palliative medicine team Team phone [phone] Greater than 50% of this time was spent counseling and coordinating care related to the above assessment and plan.

## 2022-04-21 DIAGNOSIS — R6521 Severe sepsis with septic shock: Secondary | ICD-10-CM | POA: Diagnosis not present

## 2022-04-21 DIAGNOSIS — A419 Sepsis, unspecified organism: Secondary | ICD-10-CM | POA: Diagnosis not present

## 2022-04-21 DIAGNOSIS — J189 Pneumonia, unspecified organism: Secondary | ICD-10-CM | POA: Diagnosis not present

## 2022-04-21 LAB — GLUCOSE, CAPILLARY
Glucose-Capillary: 112 mg/dL — ABNORMAL HIGH (ref 70–99)
Glucose-Capillary: 127 mg/dL — ABNORMAL HIGH (ref 70–99)
Glucose-Capillary: 168 mg/dL — ABNORMAL HIGH (ref 70–99)
Glucose-Capillary: 79 mg/dL (ref 70–99)
Glucose-Capillary: 89 mg/dL (ref 70–99)
Glucose-Capillary: 90 mg/dL (ref 70–99)

## 2022-04-21 MED ORDER — HEPARIN SODIUM (PORCINE) 1000 UNIT/ML DIALYSIS
1000.0000 [IU] | INTRAMUSCULAR | Status: DC | PRN
  Filled 2022-04-21: qty 1

## 2022-04-21 MED ORDER — LIDOCAINE HCL (PF) 1 % IJ SOLN: 5.0000 mL | INTRAMUSCULAR | Status: DC | PRN

## 2022-04-21 MED ORDER — ALTEPLASE 2 MG IJ SOLR: 2.0000 mg | Freq: Once | INTRAMUSCULAR | Status: DC | PRN

## 2022-04-21 MED ORDER — LIDOCAINE-PRILOCAINE 2.5-2.5 % EX CREA
1.0000 | TOPICAL_CREAM | CUTANEOUS | Status: DC | PRN
  Filled 2022-04-21: qty 5

## 2022-04-21 MED ORDER — PENTAFLUOROPROP-TETRAFLUOROETH EX AERO: 1.0000 | INHALATION_SPRAY | CUTANEOUS | Status: DC | PRN

## 2022-04-21 NOTE — Progress Notes (Cosign Needed Addendum)
[facility] Progress Note   Subjective:    Seen and examined patient at bedside. Sitting up eating breakfast. More interactive today. She reports feeling alittle better today. Plan for HD 7/31.  Objective Vitals:   04/20/22 1531 04/20/22 2027 04/21/22 0449 04/21/22 0755  BP: 138/75 138/74 (!) 182/82 135/63  Pulse: 91 76 95 68  Resp: 18 18 17 14   Temp: 98.9 F (37.2 C) 98.6 F (37 C) 98.7 F (37.1 C) 97.6 F (36.4 C)  TempSrc: Oral Oral Oral   SpO2: 100% 100% 97% 100%  Weight:       Physical Exam General: Elderly woman; frail and ill-appearing; on RA; NAD Heart: S1 and S2; No murmurs, gallops, or rubs Lungs:Clear anteriorly Abdomen: Soft and non-tender Extremities: R BKA; Trace edema noted L thigh Dialysis Access: RIJ TDC; L AVF (+) B  Filed Weights   04/17/22 0814 04/19/22 0822 04/19/22 1209  Weight: 46.6 kg 41.8 kg 41.3 kg    Intake/Output Summary (Last 24 hours) at 04/21/2022 1000 Last data filed at 04/20/2022 2158 Gross per 24 hour  Intake 240 ml  Output --  Net 240 ml    Additional Objective Labs: Basic Metabolic Panel: Recent Labs  Lab 04/17/22 0106 04/17/22 2200 04/19/22 0512 04/19/22 2000 04/20/22 0348 04/20/22 0822  NA 126*   < > 130* 132* 132*  --   K 3.0*   < > 2.9* 4.3 3.7  --   CL 100   < > 106 102 103  --   CO2 20*   < > 20* 19* 24  --   GLUCOSE 74   < > 86 244* 155*  --   BUN 20   < > 10 5* 7*  --   CREATININE 2.54*   < > 2.15* 1.54* 1.66*  --   CALCIUM 7.3*   < > 7.9* 7.8* 7.6*  --   PHOS 2.1*  --   --  <1.0*  --  4.1   < > = values in this interval not displayed.   Liver Function Tests: Recent Labs  Lab 04/15/22 1412 04/17/22 0106 04/19/22 2000  AST 24 15  --   ALT 7 11  --   ALKPHOS 98 74  --   BILITOT 0.7 0.5  --   PROT 4.6* 4.0*  --   ALBUMIN <1.5* <1.5* 2.4*   No results for input(s): "LIPASE", "AMYLASE" in the last 168 hours. CBC: Recent Labs  Lab 04/15/22 1412 04/15/22 1427 04/15/22 1844  04/16/22 0506 04/17/22 0106  WBC 18.2*  --  22.9* 21.9* 13.7*  NEUTROABS 16.7*  --   --   --   --   HGB 10.7*   < > 9.5* 10.0* 8.6*  HCT 33.0*   < > 27.1* 28.8* 24.1*  MCV 96.8  --  89.1 89.4 88.9  PLT 155  --  158 185 151   < > = values in this interval not displayed.   Blood Culture    Component Value Date/Time   SDES BLOOD SITE NOT SPECIFIED 04/15/2022 1552   SPECREQUEST  04/15/2022 1552    BOTTLES DRAWN AEROBIC AND ANAEROBIC Blood Culture adequate volume   CULT  04/15/2022 1552    NO GROWTH 5 DAYS Performed at [facility], [place] [address] [place], [place] [address]    REPTSTATUS 04/20/2022 FINAL 04/15/2022 1552    Cardiac Enzymes: No results for input(s): "CKTOTAL", "CKMB", "CKMBINDEX", "TROPONINI" in the last 168 hours. CBG: Recent Labs  Lab 04/20/22 1642 04/20/22 2029 04/21/22 0016 04/21/22 0451 04/21/22 0757  GLUCAP 116* 142* 127* 89 79   Iron Studies: No results for input(s): "IRON", "TIBC", "TRANSFERRIN", "FERRITIN" in the last 72 hours. Lab Results  Component Value Date   INR 1.4 (H) 04/15/2022   Studies/Results: No results found.  Medications:  albumin human Stopped (04/17/22 1100)    Chlorhexidine Gluconate Cloth  6 each Topical Q0600   Chlorhexidine Gluconate Cloth  6 each Topical Q0600   Gerhardt's butt cream   Topical QID   heparin  2,000 Units Dialysis Once in dialysis   heparin  5,000 Units Subcutaneous Q8H   insulin aspart  0-6 Units Subcutaneous TID WC   midodrine  10 mg Oral TID WC   psyllium  1 packet Oral Daily   sodium chloride flush  10-40 mL Intracatheter Q12H    Dialysis Orders: [facility]  3.5h  400/600  42.5kg  3K/2.5Ca bath Heparin 2000   RIJ TDC/ LUE AVF (1st stage BB AVF on 6/21) - last HD 7/24, post wt 46.0kg - last Hb 10.8 on 7/24 - no esa, vdra or Fe  Assessment/Plan: Sepsis/ shock/ HCAP - getting IV abx, pressors off. On Midodrine.  ESRD - on HD M-F 2x per week. While here we will run her MWF. Started HD in  April 2023. Next HD 7/31.  Volume - No edema on exam. She's now under her EDW after yesterday's tx-will need to run even for next treatment. 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 Checking labs in AM. May need to start ESA. MBD ckd - CCa in range. Noted very low PO4 7/28-Na phos IV given. PO4 now at goal. Not getting any vdra or sensipar. Hold any binders given low phos.  HTN - holding home meds. On Midodrine Hx of dementia Hypocalcemia - Ca 7.1, corrects to 9.1 which is in range.  Hypomagnesemia - getting replacement Hypokalemia - KCL 20meq PO X 1 given 7/28 for K+ 2.9. K+ now 3.7. Monitor trend closely. Continue 4K bath with HD. FTT - pt is very frail , on HD for 2-3 months. Palliative care team consulted for [place]. Reviewed note: patient not capable to make complex decisions d/t dementia. Daughter has been called to discuss [place]. Awaiting family meeting. Patient remains Full Code by default at this time. Patient severely frail, would support transition to hospice, appreciate palliative assistance.   Tobie Poet, NP [facility] 04/21/2022,10:00 AM  LOS: 6 days

## 2022-04-21 NOTE — Progress Notes (Addendum)
[facility]                                                                              Molly Arroyo, is a 76 y.o. 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  In ED, patient was profoundly hypotensive with blood pressure 79/40, mildly hypothermic with temperature 96.6 F.  Creatinine 2.15, albumin less than 1.5, sodium 134, potassium 3, WBC 18.2, hemoglobin 10.7. CT chest obtained showed right lower lobe and right middle lobe pneumonia.  CT head negative.  Patient was placed on vasopressors for severe sepsis and admitted to ICU 7/27: Patient transferred to telemetry, TRH assumed care  Assessment & Plan    Principal Problem: Severe sepsis ([place]) with septic shock secondary to multifocal pneumonia -Patient presented with hypotension, hypothermia, leukocytosis, lactic acidosis, CT chest with multifocal pneumonia/HCAP -Blood cultures negative so far, currently off vasopressors. -Sepsis resolved, BP stable, can taper midodrine if okay with renal -Vancomycin stopped, continue cefepime 5 days  Active Problems: ESRD on HD complicated with hypotension, hypoalbuminemia -Nephrology consulted.  HD per schedule -Currently on midodrine, BP stable  Hypokalemia -Corrected with HD  Diabetes mellitus type 2, uncontrolled, with hypoglycemia ESRD Recent Labs    04/20/22 1642 04/20/22 2029 04/21/22 0016 04/21/22 0451 04/21/22 0757 04/21/22 1154  GLUCAP 116* 142* 127* 89 79 90   - Continue sliding scale insulin  Episode of SVT on 7/26 -Resolved,  back in NSR  Anemia of chronic disease/ESRD -H&H currently stable, monitor counts  Severe debility, dementia -Discontinued trazodone, palliative medicine consulted, waiting on family discussion    Pressure injury of skin, sacral wound, POA Pressure Injury 04/15/22 Sacrum Mid Stage 2 -  Partial thickness loss of dermis presenting as a shallow open injury with a red, pink wound bed without slough. (Active)  04/15/22 1856  Location: Sacrum  Location Orientation: Mid  Staging: Stage 2 -  Partial thickness loss of dermis presenting as a shallow open injury with a red, pink wound bed without slough.  Wound Description (Comments):   Present on Admission: Yes  Dressing Type Foam - Lift dressing to assess site every shift 04/20/22 2130     Pressure Injury 04/15/22 Buttocks Right Stage 2 -  Partial thickness loss of dermis presenting as a shallow open injury with a red, pink wound bed without slough. skin broken (Active)  04/15/22 1857  Location: Buttocks  Location Orientation: Right  Staging: Stage 2 -  Partial thickness loss of dermis presenting as a shallow open injury with a red, pink wound bed without slough.  Wound Description (Comments): skin broken  Present on Admission: Yes  Dressing Type Foam - Lift dressing to assess site every shift 04/20/22  2130   Moderate protein calorie malnutrition, hypoalbuminemia less than 1.5 Estimated body mass index is 16.65 kg/m as calculated from the following:   Height as of 03/13/22: 5\' 2"  (1.575 m).   Weight as of this encounter: 41.3 kg.  Code Status: Full CODE STATUS DVT Prophylaxis:  heparin injection 5,000 Units Start: 04/15/22 2200 SCDs Start: 04/15/22 1730   Level of Care: Level of care: Telemetry Medical Family Communication:  No family at the bedside   Disposition Plan:      Remains inpatient appropriate   Procedures:  HD  Consultants:   Patient was [facility] by [facility] Nephrology Vascular surgery  Antimicrobials:    Anti-infectives (From admission, onward)    Start     Dose/Rate Route Frequency Ordered Stop   04/17/22 1700  vancomycin (VANCOREADY) IVPB 500 mg/100 mL  Status:  Discontinued        500 mg 100 mL/hr over 60 Minutes Intravenous Every 48 hours 04/15/22 1816 04/15/22 1817   04/16/22 2200  ceFEPIme (MAXIPIME) 1 g in sodium chloride 0.9 % 100 mL IVPB        1 g 200 mL/hr over 30 Minutes Intravenous Every 24 hours 04/15/22 1818 04/20/22 2221   04/16/22 1600  ceFEPIme (MAXIPIME) 2 g in sodium chloride 0.9 % 100 mL IVPB  Status:  Discontinued        2 g 200 mL/hr over 30 Minutes Intravenous Every 24 hours 04/15/22 1816 04/15/22 1818   04/16/22 0802  vancomycin variable dose per unstable renal function (pharmacist dosing)  Status:  Discontinued         Does not apply See admin instructions 04/16/22 0803 04/17/22 1009   04/15/22 1445  ceFEPIme (MAXIPIME) 2 g in sodium chloride 0.9 % 100 mL IVPB        2 g 200 mL/hr over 30 Minutes Intravenous  Once 04/15/22 1438 04/15/22 1703   04/15/22 1445  metroNIDAZOLE (FLAGYL) IVPB 500 mg        500 mg 100 mL/hr over 60 Minutes Intravenous  Once 04/15/22 1438 04/15/22 1703   04/15/22 1445  vancomycin (VANCOCIN) IVPB 1000 mg/200 mL premix        1,000 mg 200 mL/hr over 60 Minutes Intravenous  Once 04/15/22 1438 04/15/22 1821          Medications  Chlorhexidine Gluconate Cloth  6 each Topical Q0600   Chlorhexidine Gluconate Cloth  6 each Topical Q0600   Gerhardt's butt cream   Topical QID   heparin  2,000 Units Dialysis Once in dialysis   heparin  5,000 Units Subcutaneous Q8H   insulin aspart  0-6 Units Subcutaneous TID WC   midodrine  10 mg Oral TID WC   psyllium  1 packet Oral Daily   sodium chloride flush  10-40 mL Intracatheter Q12H      Subjective:   Jenesa Foresta was seen and examined today.  Currently alert and awake, no acute issues overnight.  No fevers or chills.  Very frail and debilitated.  Objective:   Vitals:    04/20/22 1531 04/20/22 2027 04/21/22 0449 04/21/22 0755  BP: 138/75 138/74 (!) 182/82 135/63  Pulse: 91 76 95 68  Resp: 18 18 17 14   Temp: 98.9 F (37.2 C) 98.6 F (37 C) 98.7 F (37.1 C) 97.6 F (36.4 C)  TempSrc: Oral Oral Oral   SpO2: 100% 100% 97% 100%  Weight:        Intake/Output Summary (Last 24 hours) at 04/21/2022 1400 Last data filed  at 04/21/2022 1016 Gross per 24 hour  Intake 280 ml  Output --  Net 280 ml     Wt Readings from Last 3 Encounters:  04/19/22 41.3 kg  03/13/22 44.9 kg  02/26/22 44 kg   Physical Exam General: Alert and oriented to self, frail and ill-appearing Cardiovascular: S1 S2 clear, RRR.  Respiratory: CTAB, no wheezing, rales or rhonchi Gastrointestinal: Soft, nontender, nondistended, NBS Ext: right BKA, no edema LLE Psych: flat affect   Data Reviewed:  I have personally reviewed following labs    CBC Lab Results  Component Value Date   WBC 13.7 (H) 04/17/2022   RBC 2.71 (L) 04/17/2022   HGB 8.6 (L) 04/17/2022   HCT 24.1 (L) 04/17/2022   MCV 88.9 04/17/2022   MCH 31.7 04/17/2022   PLT 151 04/17/2022   MCHC 35.7 04/17/2022   RDW 17.3 (H) 04/17/2022   LYMPHSABS 0.7 04/15/2022   MONOABS 0.6 04/15/2022   EOSABS 0.1 04/15/2022   BASOSABS 0.1 06/15/3006     Last metabolic panel Lab Results  Component Value Date   NA 132 (L) 04/20/2022   K 3.7 04/20/2022   CL 103 04/20/2022   CO2 24 04/20/2022   BUN 7 (L) 04/20/2022   CREATININE 1.66 (H) 04/20/2022   GLUCOSE 155 (H) 04/20/2022   GFRNONAA 32 (L) 04/20/2022   CALCIUM 7.6 (L) 04/20/2022   PHOS 4.1 04/20/2022   PROT 4.0 (L) 04/17/2022   ALBUMIN 2.4 (L) 04/19/2022   LABGLOB 2.9 02/13/2021   AGRATIO 1.0 02/13/2021   BILITOT 0.5 04/17/2022   ALKPHOS 74 04/17/2022   AST 15 04/17/2022   ALT 11 04/17/2022   ANIONGAP 5 04/20/2022    CBG (last 3)  Recent Labs    04/21/22 0451 04/21/22 0757 04/21/22 1154  GLUCAP 89 79 90      Coagulation Profile: Recent Labs  Lab  04/15/22 1418  INR 1.4*      Tajuan Dufault M.D. [facility] 04/21/2022, 2:00 PM  Available via Epic secure chat 7am-7pm After 7 pm, please refer to night coverage provider listed on amion.

## 2022-04-22 DIAGNOSIS — R6521 Severe sepsis with septic shock: Secondary | ICD-10-CM | POA: Diagnosis not present

## 2022-04-22 DIAGNOSIS — J189 Pneumonia, unspecified organism: Secondary | ICD-10-CM | POA: Diagnosis not present

## 2022-04-22 DIAGNOSIS — A419 Sepsis, unspecified organism: Secondary | ICD-10-CM | POA: Diagnosis not present

## 2022-04-22 LAB — GLUCOSE, CAPILLARY
Glucose-Capillary: 118 mg/dL — ABNORMAL HIGH (ref 70–99)
Glucose-Capillary: 134 mg/dL — ABNORMAL HIGH (ref 70–99)
Glucose-Capillary: 155 mg/dL — ABNORMAL HIGH (ref 70–99)
Glucose-Capillary: 79 mg/dL (ref 70–99)
Glucose-Capillary: 97 mg/dL (ref 70–99)

## 2022-04-22 LAB — RENAL FUNCTION PANEL
Albumin: 2.1 g/dL — ABNORMAL LOW (ref 3.5–5.0)
Anion gap: 8 (ref 5–15)
BUN: 17 mg/dL (ref 8–23)
CO2: 18 mmol/L — ABNORMAL LOW (ref 22–32)
Calcium: 8 mg/dL — ABNORMAL LOW (ref 8.9–10.3)
Chloride: 98 mmol/L (ref 98–111)
Creatinine, Ser: 2.46 mg/dL — ABNORMAL HIGH (ref 0.44–1.00)
GFR, Estimated: 20 mL/min — ABNORMAL LOW (ref >60)
Glucose, Bld: 109 mg/dL — ABNORMAL HIGH (ref 70–99)
Phosphorus: 3.8 mg/dL (ref 2.5–4.6)
Potassium: 3.3 mmol/L — ABNORMAL LOW (ref 3.5–5.1)
Sodium: 124 mmol/L — ABNORMAL LOW (ref 135–145)

## 2022-04-22 LAB — CBC WITH DIFFERENTIAL/PLATELET
Abs Immature Granulocytes: 0.02 10*3/uL (ref 0.00–0.07)
Basophils Absolute: 0 10*3/uL (ref 0.0–0.1)
Basophils Relative: 1 %
Eosinophils Absolute: 0.3 10*3/uL (ref 0.0–0.5)
Eosinophils Relative: 3 %
HCT: 24.4 % — ABNORMAL LOW (ref 36.0–46.0)
Hemoglobin: 8.4 g/dL — ABNORMAL LOW (ref 12.0–15.0)
Immature Granulocytes: 0 %
Lymphocytes Relative: 20 %
Lymphs Abs: 1.6 10*3/uL (ref 0.7–4.0)
MCH: 31.2 pg (ref 26.0–34.0)
MCHC: 34.4 g/dL (ref 30.0–36.0)
MCV: 90.7 fL (ref 80.0–100.0)
Monocytes Absolute: 0.6 10*3/uL (ref 0.1–1.0)
Monocytes Relative: 8 %
Neutro Abs: 5.4 10*3/uL (ref 1.7–7.7)
Neutrophils Relative %: 68 %
Platelets: 118 10*3/uL — ABNORMAL LOW (ref 150–400)
RBC: 2.69 MIL/uL — ABNORMAL LOW (ref 3.87–5.11)
RDW: 17 % — ABNORMAL HIGH (ref 11.5–15.5)
WBC: 8 10*3/uL (ref 4.0–10.5)
nRBC: 0 % (ref 0.0–0.2)

## 2022-04-22 MED ORDER — POTASSIUM CHLORIDE CRYS ER 20 MEQ PO TBCR
20.0000 meq | EXTENDED_RELEASE_TABLET | Freq: Once | ORAL | Status: AC
Start: 2022-04-22 — End: 2022-04-22
  Administered 2022-04-22: 20 meq via ORAL
  Filled 2022-04-22: qty 1

## 2022-04-22 MED ORDER — DARBEPOETIN ALFA 40 MCG/0.4ML IJ SOSY
40.0000 ug | PREFILLED_SYRINGE | INTRAMUSCULAR | Status: DC
  Administered 2022-04-22: 40 ug via INTRAVENOUS
  Filled 2022-04-22 (×2): qty 0.4

## 2022-04-22 MED ORDER — HEPARIN SODIUM (PORCINE) 1000 UNIT/ML DIALYSIS
2000.0000 [IU] | Freq: Once | INTRAMUSCULAR | Status: DC
  Filled 2022-04-22: qty 2

## 2022-04-22 NOTE — Progress Notes (Addendum)
[facility] Progress Note   Subjective:   Patient seen and examined at bedside.  Best friends at bedside. Vomiting this AM.  Not able to eat.  Denies CP, SOB and abdominal pain.   Objective Vitals:   04/21/22 1551 04/21/22 2113 04/22/22 0536 04/22/22 0814  BP: 123/73 134/76 135/67 (!) 149/82  Pulse: 82 78 73 76  Resp: 18 17 17 16   Temp: 98.7 F (37.1 C) 98 F (36.7 C) 98 F (36.7 C) 98 F (36.7 C)  TempSrc:  Oral Oral Oral  SpO2: 100% 100% 100% 100%  Weight:       Physical Exam General:thin, chronically ill appearing female in NAD Heart:RRR, no mrg Lungs:CTAB, nml WOB Abdomen:soft, NTND Extremities:2-3+ dependent edema in hips, R BKA, no LE edema Dialysis Access: TDC, LU AVF +b  Filed Weights   04/17/22 0814 04/19/22 0822 04/19/22 1209  Weight: 46.6 kg 41.8 kg 41.3 kg    Intake/Output Summary (Last 24 hours) at 04/22/2022 1012 Last data filed at 04/21/2022 1016 Gross per 24 hour  Intake 40 ml  Output --  Net 40 ml    Additional Objective Labs: Basic Metabolic Panel: Recent Labs  Lab 04/19/22 2000 04/20/22 0348 04/20/22 0822 04/22/22 0517  NA 132* 132*  --  124*  K 4.3 3.7  --  3.3*  CL 102 103  --  98  CO2 19* 24  --  18*  GLUCOSE 244* 155*  --  109*  BUN 5* 7*  --  17  CREATININE 1.54* 1.66*  --  2.46*  CALCIUM 7.8* 7.6*  --  8.0*  PHOS <1.0*  --  4.1 3.8   Liver Function Tests: Recent Labs  Lab 04/15/22 1412 04/17/22 0106 04/19/22 2000 04/22/22 0517  AST 24 15  --   --   ALT 7 11  --   --   ALKPHOS 98 74  --   --   BILITOT 0.7 0.5  --   --   PROT 4.6* 4.0*  --   --   ALBUMIN <1.5* <1.5* 2.4* 2.1*   CBC: Recent Labs  Lab 04/15/22 1412 04/15/22 1427 04/15/22 1844 04/16/22 0506 04/17/22 0106 04/22/22 0517  WBC 18.2*  --  22.9* 21.9* 13.7* 8.0  NEUTROABS 16.7*  --   --   --   --  5.4  HGB 10.7*   < > 9.5* 10.0* 8.6* 8.4*  HCT 33.0*   < > 27.1* 28.8* 24.1* 24.4*  MCV 96.8  --  89.1 89.4 88.9 90.7  PLT 155  --  158 185  151 118*   < > = values in this interval not displayed.   Blood Culture    Component Value Date/Time   SDES BLOOD SITE NOT SPECIFIED 04/15/2022 1552   SPECREQUEST  04/15/2022 1552    BOTTLES DRAWN AEROBIC AND ANAEROBIC Blood Culture adequate volume   CULT  04/15/2022 1552    NO GROWTH 5 DAYS Performed at [facility], [place] [address], [place], [place] [address]    REPTSTATUS 04/20/2022 FINAL 04/15/2022 1552   CBG: Recent Labs  Lab 04/21/22 1554 04/21/22 2108 04/22/22 0053 04/22/22 0537 04/22/22 0816  GLUCAP 112* 168* 155* 97 118*    Medications:  albumin human Stopped (04/17/22 1100)    Chlorhexidine Gluconate Cloth  6 each Topical Q0600   Chlorhexidine Gluconate Cloth  6 each Topical Q0600   Gerhardt's butt cream   Topical QID   heparin  2,000 Units Dialysis Once in  dialysis   heparin  5,000 Units Subcutaneous Q8H   insulin aspart  0-6 Units Subcutaneous TID WC   midodrine  10 mg Oral TID WC   potassium chloride  20 mEq Oral Once   psyllium  1 packet Oral Daily   sodium chloride flush  10-40 mL Intracatheter Q12H    Dialysis Orders: [facility]  3.5h  400/600  42.5kg  3K/2.5Ca bath Heparin 2000   RIJ TDC/ LUE AVF (1st stage BB AVF on 6/21) - last HD 7/24, post wt 46.0kg - last Hb 10.8 on 7/24 - no esa, vdra or Fe   Assessment/Plan: Sepsis/ shock/ HCAP - completed IV abx, pressors off. On Midodrine.  ESRD - on HD M-F 2x per week. While here we will run her MWF. Started HD in April 2023. Next HD 7/31.  Volume - 2-3+ edema in hips.  Under edw, likely weight loss with chronic n/v.  Titrate down volume as tolerated.  Nausea/vomiting - chronic issue ongoing for months.  On zofran. Anemia esrd - Hbg 8.4, start aranesp 92mg qwk. 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 Noted very low PO4 7/28-Na phos IV given. Not getting any vdra or sensipar. Hold any binders given low phos.  HTN - holding home meds. On Midodrine Hx of dementia Hypomagnesemia - s/p replacement. Last in  goal. Hypokalemia - KCL 440m PO X 1 given 7/28 for K+ 2.9. K+ now 3.3 this AM, given additional 2073mPO today. Monitor trend closely. Continue 4K bath with HD. FTT - pt is very frail , on HD for 2-3 months. Palliative care team consulted for [place] note: patient not capable to make complex decisions d/t dementia. Daughter has been called to discuss [place] family meeting. Patient remains Full Code by default at this time. Patient severely frail, would support transition to hospice, appreciate palliative assistance.   LinJen MowA-C [facility] 04/22/2022,10:12 AM  LOS: 7 days   Nephrology attending: The patient was seen and examined.  Chart reviewed.  I agree with above. Met with patient's parents at the bedside.  ESRD on HD admitted with pneumonia, sepsis, recovering well.  Issue with failure to thrive, dementia etc. Noted hyponatremia and hypokalemia which will be managed with dialysis.  Minimize fluid restriction. Appreciate palliative care consult for goals of care discussion.  Discussion ongoing.  [facility].

## 2022-04-22 NOTE — Plan of Care (Signed)
Vomited once, PRN given.  Problem: Elimination: Goal: Will not experience complications related to bowel motility Outcome: Progressing

## 2022-04-22 NOTE — Progress Notes (Signed)
Received patient in bed to unit. Alert and oriented. Informed consent signed and in  chart.   Treatment completed: 1900   Patient tolerated well. Transported back to the room alert, without acute distress. Hand-off given to patient's nurse.   Access used: Right HD Catheter  Access issues: none   Total UF removed: 0.0L  Medication(s) given: Aranesp 57mcg  Post HD VS: 111/61 (77), 100%, 91, 98.2, 18 Post HD weight: 41.3kg   [facility]

## 2022-04-22 NOTE — Progress Notes (Signed)
[facility]                                                                              Molly Arroyo, is a 76 y.o. 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  In ED, patient was profoundly hypotensive with blood pressure 79/40, mildly hypothermic with temperature 96.6 F.  Creatinine 2.15, albumin less than 1.5, sodium 134, potassium 3, WBC 18.2, hemoglobin 10.7. CT chest obtained showed right lower lobe and right middle lobe pneumonia.  CT head negative.  Patient was placed on vasopressors for severe sepsis and admitted to ICU 7/27: Patient transferred to telemetry, TRH assumed care  Assessment & Plan    Principal Problem: Severe sepsis ([place]) with septic shock secondary to multifocal pneumonia -Patient presented with hypotension, hypothermia, leukocytosis, lactic acidosis, CT chest with multifocal pneumonia/HCAP -Blood cultures negative so far, currently off vasopressors. -Sepsis physiology resolved, completed vancomycin, cefepime  Active Problems: ESRD on HD complicated with hypotension, hypoalbuminemia, hyponatremia, metabolic acidosis -Nephrology consulted.  HD per schedule -BP now stable, on midodrine, taper per nephrology  Hypokalemia -Corrected with HD  Diabetes mellitus type 2, uncontrolled, with hypoglycemia ESRD Recent Labs    04/21/22 1554 04/21/22 2108 04/22/22 0053 04/22/22 0537 04/22/22 0816 04/22/22 1202  GLUCAP 112* 168* 155* 97 118* 134*   - Continue sliding scale insulin  Episode of SVT on 7/26 -Resolved, back  in NSR  Anemia of chronic disease/ESRD -H&H currently stable, monitor counts  Severe debility, dementia -Discontinued trazodone -Palliative medicine has reached out to the patient's daughter and left voicemails, however she has not returned call.  Waiting for family discussion for [place].  Overall poor prognosis, extremely frail, dementia, ESRD     Pressure injury of skin, sacral wound, POA Pressure Injury 04/15/22 Sacrum Mid Stage 2 -  Partial thickness loss of dermis presenting as a shallow open injury with a red, pink wound bed without slough. (Active)  04/15/22 1856  Location: Sacrum  Location Orientation: Mid  Staging: Stage 2 -  Partial thickness loss of dermis presenting as a shallow open injury with a red, pink wound bed without slough.  Wound Description (Comments):   Present on Admission: Yes  Dressing Type Foam - Lift dressing to assess site every shift 04/21/22 2223     Pressure Injury 04/15/22 Buttocks Right Stage 2 -  Partial thickness loss of dermis presenting as a shallow open injury with a red, pink wound bed without slough. skin broken (Active)  04/15/22 1857  Location: Buttocks  Location Orientation: Right  Staging: Stage 2 -  Partial thickness loss of dermis presenting as a shallow open injury with a red, pink wound bed without slough.  Wound  Description (Comments): skin broken  Present on Admission: Yes  Dressing Type Foam - Lift dressing to assess site every shift 04/21/22 2223   Moderate protein calorie malnutrition, hypoalbuminemia less than 1.5 Estimated body mass index is 16.65 kg/m as calculated from the following:   Height as of 03/13/22: 5\' 2"  (1.575 m).   Weight as of this encounter: 41.3 kg.  Code Status: Full CODE STATUS DVT Prophylaxis:  heparin injection 5,000 Units Start: 04/15/22 2200 SCDs Start: 04/15/22 1730   Level of Care: Level of care: Telemetry Medical Family Communication:  No family at the bedside   Disposition Plan:      Remains  inpatient appropriate   Procedures:  HD  Consultants:   Patient was admitted by [facility] Nephrology Vascular surgery  Antimicrobials:   Anti-infectives (From admission, onward)    Start     Dose/Rate Route Frequency Ordered Stop   04/17/22 1700  vancomycin (VANCOREADY) IVPB 500 mg/100 mL  Status:  Discontinued        500 mg 100 mL/hr over 60 Minutes Intravenous Every 48 hours 04/15/22 1816 04/15/22 1817   04/16/22 2200  ceFEPIme (MAXIPIME) 1 g in sodium chloride 0.9 % 100 mL IVPB        1 g 200 mL/hr over 30 Minutes Intravenous Every 24 hours 04/15/22 1818 04/20/22 2221   04/16/22 1600  ceFEPIme (MAXIPIME) 2 g in sodium chloride 0.9 % 100 mL IVPB  Status:  Discontinued        2 g 200 mL/hr over 30 Minutes Intravenous Every 24 hours 04/15/22 1816 04/15/22 1818   04/16/22 0802  vancomycin variable dose per unstable renal function (pharmacist dosing)  Status:  Discontinued         Does not apply See admin instructions 04/16/22 0803 04/17/22 1009   04/15/22 1445  ceFEPIme (MAXIPIME) 2 g in sodium chloride 0.9 % 100 mL IVPB        2 g 200 mL/hr over 30 Minutes Intravenous  Once 04/15/22 1438 04/15/22 1703   04/15/22 1445  metroNIDAZOLE (FLAGYL) IVPB 500 mg        500 mg 100 mL/hr over 60 Minutes Intravenous  Once 04/15/22 1438 04/15/22 1703   04/15/22 1445  vancomycin (VANCOCIN) IVPB 1000 mg/200 mL premix        1,000 mg 200 mL/hr over 60 Minutes Intravenous  Once 04/15/22 1438 04/15/22 1821          Medications  Chlorhexidine Gluconate Cloth  6 each Topical Q0600   Chlorhexidine Gluconate Cloth  6 each Topical Q0600   darbepoetin (ARANESP) injection - DIALYSIS  40 mcg Intravenous Q Mon-HD   Gerhardt's butt cream   Topical QID   heparin  2,000 Units Dialysis Once in dialysis   heparin  5,000 Units Subcutaneous Q8H   insulin aspart  0-6 Units Subcutaneous TID WC   midodrine  10 mg Oral TID WC   psyllium  1 packet Oral Daily   sodium chloride flush  10-40 mL Intracatheter  Q12H      Subjective:   Molly Arroyo was seen and examined today.  Alert and awake, having vomiting.  Oriented, on asking how she is doing, patient reports okay  Objective:   Vitals:   04/21/22 1551 04/21/22 2113 04/22/22 0536 04/22/22 0814  BP: 123/73 134/76 135/67 (!) 149/82  Pulse: 82 78 73 76  Resp: 18 17 17 16   Temp: 98.7 F (37.1 C) 98 F (36.7 C) 98 F (36.7 C) 98 F (36.7  C)  TempSrc:  Oral Oral Oral  SpO2: 100% 100% 100% 100%  Weight:        Wt Readings from Last 3 Encounters:  04/19/22 41.3 kg  03/13/22 44.9 kg  02/26/22 44 kg    Physical Exam General: Alert and oriented, frail and ill-appearing Cardiovascular: S1 S2 clear, RRR.  Respiratory: CTAB, no wheezing Gastrointestinal: Soft, nontender, nondistended, NBS Ext: right BKA Psych: Normal affect, dementia   Data Reviewed:  I have personally reviewed following labs    CBC Lab Results  Component Value Date   WBC 8.0 04/22/2022   RBC 2.69 (L) 04/22/2022   HGB 8.4 (L) 04/22/2022   HCT 24.4 (L) 04/22/2022   MCV 90.7 04/22/2022   MCH 31.2 04/22/2022   PLT 118 (L) 04/22/2022   MCHC 34.4 04/22/2022   RDW 17.0 (H) 04/22/2022   LYMPHSABS 1.6 04/22/2022   MONOABS 0.6 04/22/2022   EOSABS 0.3 04/22/2022   BASOSABS 0.0 34/19/6222     Last metabolic panel Lab Results  Component Value Date   NA 124 (L) 04/22/2022   K 3.3 (L) 04/22/2022   CL 98 04/22/2022   CO2 18 (L) 04/22/2022   BUN 17 04/22/2022   CREATININE 2.46 (H) 04/22/2022   GLUCOSE 109 (H) 04/22/2022   GFRNONAA 20 (L) 04/22/2022   CALCIUM 8.0 (L) 04/22/2022   PHOS 3.8 04/22/2022   PROT 4.0 (L) 04/17/2022   ALBUMIN 2.1 (L) 04/22/2022   LABGLOB 2.9 02/13/2021   AGRATIO 1.0 02/13/2021   BILITOT 0.5 04/17/2022   ALKPHOS 74 04/17/2022   AST 15 04/17/2022   ALT 11 04/17/2022   ANIONGAP 8 04/22/2022    CBG (last 3)  Recent Labs    04/22/22 0537 04/22/22 0816 04/22/22 1202  GLUCAP 97 118* 134*      Coagulation  Profile: Recent Labs  Lab 04/15/22 1418  INR 1.4*      Alilah Mcmeans M.D. [facility] 04/22/2022, 12:11 PM  Available via Epic secure chat 7am-7pm After 7 pm, please refer to night coverage provider listed on amion.

## 2022-04-22 NOTE — Progress Notes (Signed)
Physical Therapy Treatment Patient Details Name: Molly Arroyo MRN: [id number] DOB: 10-06-1945 Today's Date: 04/22/2022   History of Present Illness pt is a 76 y/o female admitted 7/24 to the ED from HD center after becoming minimally responsive with hypotension during HD.  In ED pt profoundly hypotensive and mildly hypothermic.  CT of chest revealed R LL and middle lobe PNA.  Pt treated for sepsis.  PMHx:  ESRD on HD Monday and Friday, type 2 diabetes, s/p BKA secondary to diabetic foot ulcer hypertension, and hypothyroidism    PT Comments    Received pt semi-reclined in bed asleep. Pt required max verbal and tactile stimuli to arouse, but able to maintain alertness throughout session. Pt with increased difficulty understanding therapist's cues (question due to poor motor planning/sequencing vs HOH). Pt required max A for bed mobility and to scoot hips to EOB. Pt c/o increased dizziness but was able to perform seated LAQ on LLE with close supervision prior to requesting to lie back down due to fatigue. Scooted to [facility] with total A and once positioned comfortably, pt began vomiting - NT arrived to assess vitals and took over with care. Acute PT to cont to follow.    Recommendations for follow up therapy are one component of a multi-disciplinary discharge planning process, led by the attending physician.  Recommendations may be updated based on patient status, additional functional criteria and insurance authorization.  Follow Up Recommendations  Skilled nursing-short term rehab (<3 hours/day) Can patient physically be transported by private vehicle: No   Assistance Recommended at Discharge Frequent or constant Supervision/Assistance  Patient can return home with the following A lot of help with walking and/or transfers;A little help with bathing/dressing/bathroom;Assistance with cooking/housework;Direct supervision/assist for medications management;Direct supervision/assist for financial  management;Assist for transportation;Help with stairs or ramp for entrance;Assistance with feeding   Equipment Recommendations  Other (comment) (TBD)    Recommendations for Other Services       Precautions / Restrictions Precautions Precautions: Fall Precaution Comments: HOH Restrictions Weight Bearing Restrictions: No     Mobility  Bed Mobility Overal bed mobility: Needs Assistance Bed Mobility: Rolling, Supine to Sit, Sit to Supine Rolling: Max assist   Supine to sit: Max assist Sit to supine: Max assist   General bed mobility comments: Pt made no initation to sit EOB and required max A to scoot hips to EOB. Pt with posterior lean and heavy sacral sitting but able to maintain static sitting balance with close supervision once upright. Patient Response: Flat affect  Transfers                   General transfer comment: pt deferred standing attempts or transferring OOB due to fatigue    Ambulation/Gait                   Stairs             Wheelchair Mobility    Modified Rankin (Stroke Patients Only)       Balance Overall balance assessment: Needs assistance Sitting-balance support: Feet unsupported, Bilateral upper extremity supported Sitting balance-Leahy Scale: Fair Sitting balance - Comments: able to maintain static sitting balance with close supervision Postural control: Posterior lean                                  Cognition Arousal/Alertness: Lethargic Behavior During Therapy: Flat affect Overall Cognitive Status: No family/caregiver present to determine baseline cognitive  functioning                                 General Comments: session limited by emesis        Exercises General Exercises - Lower Extremity Long Arc Quad: AROM, Left, 10 reps    General Comments General comments (skin integrity, edema, etc.): Pt reported fatigue and requested to lie back down. Once getting comfortable in  supine, pt began vomitting      Pertinent Vitals/Pain Pain Assessment Pain Assessment: No/denies pain    Home Living                          Prior Function            PT Goals (current goals can now be found in the care plan section) Acute Rehab PT Goals Patient Stated Goal: pt did not relate her goals PT Goal Formulation: Patient unable to participate in goal setting Time For Goal Achievement: 05/02/22 Potential to Achieve Goals: Fair    Frequency    Min 3X/week      PT Plan Current plan remains appropriate    Co-evaluation              AM-PAC PT "6 Clicks" Mobility   Outcome Measure  Help needed turning from your back to your side while in a flat bed without using bedrails?: A Lot Help needed moving from lying on your back to sitting on the side of a flat bed without using bedrails?: A Lot Help needed moving to and from a bed to a chair (including a wheelchair)?: A Lot Help needed standing up from a chair using your arms (e.g., wheelchair or bedside chair)?: A Lot Help needed to walk in hospital room?: Total Help needed climbing 3-5 steps with a railing? : Total 6 Click Score: 10    End of Session   Activity Tolerance: Patient limited by fatigue;Other (comment) (emesis episode at end of session) Patient left: in bed;with call bell/phone within reach;with bed alarm set;with nursing/sitter in room Nurse Communication: Mobility status PT Visit Diagnosis: Other abnormalities of gait and mobility (R26.89);Muscle weakness (generalized) (M62.81);Unsteadiness on feet (R26.81)     Time: 3474-2595 PT Time Calculation (min) (ACUTE ONLY): 20 min  Charges:  $Therapeutic Activity: 8-22 mins                     Becky Sax PT, DPT  Blenda Nicely 04/22/2022, 8:22 AM

## 2022-04-22 NOTE — Care Management Important Message (Signed)
Important Message  Patient Details  Name: Molly Arroyo MRN: [id number] Date of Birth: [birth year]   Medicare Important Message Given:  Yes     Orbie Pyo 04/22/2022, 2:07 PM

## 2022-04-23 DIAGNOSIS — R6521 Severe sepsis with septic shock: Secondary | ICD-10-CM | POA: Diagnosis not present

## 2022-04-23 DIAGNOSIS — J189 Pneumonia, unspecified organism: Secondary | ICD-10-CM | POA: Diagnosis not present

## 2022-04-23 DIAGNOSIS — A419 Sepsis, unspecified organism: Secondary | ICD-10-CM | POA: Diagnosis not present

## 2022-04-23 LAB — GLUCOSE, CAPILLARY
Glucose-Capillary: 81 mg/dL (ref 70–99)
Glucose-Capillary: 101 mg/dL — ABNORMAL HIGH (ref 70–99)
Glucose-Capillary: 191 mg/dL — ABNORMAL HIGH (ref 70–99)
Glucose-Capillary: 151 mg/dL — ABNORMAL HIGH (ref 70–99)

## 2022-04-23 MED ORDER — CHLORHEXIDINE GLUCONATE CLOTH 2 % EX PADS
6.0000 | MEDICATED_PAD | Freq: Every day | CUTANEOUS | Status: DC
  Administered 2022-04-23 – 2022-04-24 (×2): 6 via TOPICAL

## 2022-04-23 NOTE — Progress Notes (Addendum)
[facility] Progress Note   Subjective:   Patient seen and examined at bedside.  Getting ready to eat breakfast.  Reports dialysis went well.  Denies CP, SOB, abdominal pain and n/v/d.   Objective Vitals:   04/22/22 1859 04/22/22 2027 04/23/22 0625 04/23/22 0817  BP: 111/61 113/68 (!) 115/57 (!) 105/54  Pulse: 91 80 84 82  Resp: 18 16 18 18   Temp: 98.2 F (36.8 C) 98.6 F (37 C) (!) 97.5 F (36.4 C) 98 F (36.7 C)  TempSrc:  Oral Oral Oral  SpO2: 100% 100% 97% 99%  Weight:       Physical Exam General:pleasantly confused, thin, elderly female in NAD Heart:RRR, no mrg Lungs:CTAB, nml WOB on RA Abdomen:soft, NTND Extremities:2+ edema in hips, trace edema in thighs, R BKA Dialysis Access: TDC, LU AVF +b/t   Filed Weights   04/17/22 0814 04/19/22 0822 04/19/22 1209  Weight: 46.6 kg 41.8 kg 41.3 kg    Intake/Output Summary (Last 24 hours) at 04/23/2022 1012 Last data filed at 04/22/2022 1859 Gross per 24 hour  Intake --  Output 0 ml  Net 0 ml    Additional Objective Labs: Basic Metabolic Panel: Recent Labs  Lab 04/19/22 2000 04/20/22 0348 04/20/22 0822 04/22/22 0517  NA 132* 132*  --  124*  K 4.3 3.7  --  3.3*  CL 102 103  --  98  CO2 19* 24  --  18*  GLUCOSE 244* 155*  --  109*  BUN 5* 7*  --  17  CREATININE 1.54* 1.66*  --  2.46*  CALCIUM 7.8* 7.6*  --  8.0*  PHOS <1.0*  --  4.1 3.8   Liver Function Tests: Recent Labs  Lab 04/17/22 0106 04/19/22 2000 04/22/22 0517  AST 15  --   --   ALT 11  --   --   ALKPHOS 74  --   --   BILITOT 0.5  --   --   PROT 4.0*  --   --   ALBUMIN <1.5* 2.4* 2.1*   CBC: Recent Labs  Lab 04/17/22 0106 04/22/22 0517  WBC 13.7* 8.0  NEUTROABS  --  5.4  HGB 8.6* 8.4*  HCT 24.1* 24.4*  MCV 88.9 90.7  PLT 151 118*   CBG: Recent Labs  Lab 04/22/22 0537 04/22/22 0816 04/22/22 1202 04/22/22 2030 04/23/22 0814  GLUCAP 97 118* 134* 79 81    Medications:  albumin human Stopped (04/17/22 1100)     Chlorhexidine Gluconate Cloth  6 each Topical Q0600   Chlorhexidine Gluconate Cloth  6 each Topical Q0600   darbepoetin (ARANESP) injection - DIALYSIS  40 mcg Intravenous Q Mon-HD   Gerhardt's butt cream   Topical QID   heparin  2,000 Units Dialysis Once in dialysis   heparin  5,000 Units Subcutaneous Q8H   insulin aspart  0-6 Units Subcutaneous TID WC   midodrine  10 mg Oral TID WC   psyllium  1 packet Oral Daily   sodium chloride flush  10-40 mL Intracatheter Q12H    Dialysis Orders: [facility]  3.5h  400/600  42.5kg  3K/2.5Ca bath Heparin 2000   RIJ TDC/ LUE AVF (1st stage BB AVF on 6/21) - last HD 7/24, post wt 46.0kg - last Hb 10.8 on 7/24 - no esa, vdra or Fe   Assessment/Plan: Sepsis/ shock/ HCAP - completed IV abx, pressors off. On Midodrine.  ESRD - on HD M-F 2x per week. While here we will run  her MWF. Started HD in April 2023. Next HD 8/2.  Volume - 2-3+ edema in hips.  Under edw, likely weight loss with chronic n/v.  Titrate down volume as tolerated.  Nausea/vomiting - chronic issue ongoing for months.  On zofran. Anemia esrd - last Hbg 8.4, aranesp 66mcg qMon. 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 Noted very low PO4 7/28-Na phos IV given. Not getting any vdra or sensipar. Hold any binders given low phos.  HTN - holding home meds. On Midodrine Hx of dementia Hypomagnesemia - s/p replacement. Last in goal. Hypokalemia - KCL 65meq PO X 1 given 7/28 for K+ 2.9. K+ now 3.3 this AM, given additional 79meq PO today. Monitor trend closely. Continue 4K bath with HD. FTT - pt is very frail , on HD for 2-3 months. Palliative care team consulted for [place]. Reviewed note: patient not capable to make complex decisions d/t dementia. Daughter has been called to discuss [place]. Awaiting family meeting. Patient remains Full Code by default at this time. Patient severely frail, would support transition to hospice, appreciate palliative assistance.  Jen Mow, PA-C [facility] 04/23/2022,10:12 AM  LOS: 8 days   Nephrology attending: The patient was seen and examined at the bedside.  Chart reviewed.  I agree with above.  ESRD on HD, last dialysis yesterday.  We will plan for next regular treatment tomorrow.  Manage hyponatremia and hypokalemia with the dialysis.  Recommend fluid restriction.  The main issue is failure to thrive, generalized weakness and dementia.  Palliative care team is following and discussion for safe discharge planning ongoing.  Katheran James, [facility].

## 2022-04-23 NOTE — Plan of Care (Signed)
  Problem: Elimination: Goal: Will not experience complications related to bowel motility Outcome: Progressing   

## 2022-04-23 NOTE — Progress Notes (Signed)
[facility]                                                                              Molly Arroyo, is a 76 y.o. 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  In ED, patient was profoundly hypotensive with blood pressure 79/40, mildly hypothermic with temperature 96.6 F.  Creatinine 2.15, albumin less than 1.5, sodium 134, potassium 3, WBC 18.2, hemoglobin 10.7. CT chest obtained showed right lower lobe and right middle lobe pneumonia.  CT head negative.  Patient was placed on vasopressors for severe sepsis and admitted to ICU 7/27: Patient transferred to telemetry, TRH assumed care  Assessment & Plan    Principal Problem: Severe sepsis ([place]) with septic shock secondary to multifocal pneumonia -Patient presented with hypotension, hypothermia, leukocytosis, lactic acidosis, CT chest with multifocal pneumonia/HCAP -Blood cultures negative so far, currently off vasopressors. -Sepsis physiology resolved, completed vancomycin, cefepime  Active Problems: ESRD on HD complicated with hypotension, hypoalbuminemia, hyponatremia, metabolic acidosis -Nephrology consulted.  HD per schedule -BP stable, continue midodrine  Hypokalemia -Corrected with HD  Diabetes mellitus type 2, uncontrolled, with hypoglycemia ESRD Recent Labs    04/22/22 0537 04/22/22 0816 04/22/22 1202 04/22/22 2030 04/23/22 0814 04/23/22 1159  GLUCAP 97 118* 134* 79 81 101*    -CBG stable, continue sliding scale insulin  Episode of SVT on 7/26 -Resolved, back in  NSR  Anemia of chronic disease/ESRD -H&H currently stable, monitor counts  Severe debility, dementia -Discontinued trazodone due to somnolence  -Palliative medicine has reached out to the patient's daughter and left voicemails, however she has not returned call.  Waiting for family discussion for [place].  Overall poor prognosis, extremely frail, dementia, ESRD - I called the patient's daughter Clinton Sawyer x 2 on phone number [phone] listed on contacts to discuss goals of care and update however phone was picked up by a female and he continued to hang up the phone.      Pressure injury of skin, -Sacral wound, stage II, POA - Right buttocks, stage II, POA    Moderate protein calorie malnutrition, hypoalbuminemia less than 1.5 Estimated body mass index is 16.65 kg/m as calculated from the following:   Height as of 03/13/22: 5\' 2"  (1.575 m).   Weight as of this encounter: 41.3 kg.  Code Status: Full CODE STATUS DVT Prophylaxis:  heparin injection 5,000 Units Start: 04/15/22 2200 SCDs Start: 04/15/22 1730   Level of Care: Level of care: Telemetry Medical Family Communication:  No family at the bedside. Unable to reach patient's daughter.   Disposition Plan: Difficult disposition as palliative medicine and myself have not been able to reach the daughter to address plan of care/GOC.  Procedures:  HD  Consultants:   Patient was admitted by [facility]ph[facility] Vascular surgery  Antimicrobials: Completed vancomycin on 04/17/2022                             Completed cefepime on 04/20/2022    Medications  Chlorhexidine Gluconate Cloth  6 each Topical Q0600   Chlorhexidine Gluconate Cloth  6 each Topical Q0600   Chlorhexidine Gluconate Cloth  6 each Topical Q0600   darbepoetin (ARANESP) injection - DIALYSIS  40 mcg Intravenous Q Mon-HD   Gerhardt's butt cream   Topical QID   heparin  2,000 Units Dialysis Once in dialysis   heparin  5,000 Units Subcutaneous Q8H   insulin aspart  0-6  Units Subcutaneous TID WC   midodrine  10 mg Oral TID WC   psyllium  1 packet Oral Daily   sodium chloride flush  10-40 mL Intracatheter Q12H      Subjective:   Molly Arroyo was seen and examined today.  Alert and awake, no acute complaints.  Has dementia.  Difficult to obtain review of system from the patient.  Objective:   Vitals:   04/22/22 1859 04/22/22 2027 04/23/22 0625 04/23/22 0817  BP: 111/61 113/68 (!) 115/57 (!) 105/54  Pulse: 91 80 84 82  Resp: 18 16 18 18   Temp: 98.2 F (36.8 C) 98.6 F (37 C) (!) 97.5 F (36.4 C) 98 F (36.7 C)  TempSrc:  Oral Oral Oral  SpO2: 100% 100% 97% 99%  Weight:        Wt Readings from Last 3 Encounters:  04/19/22 41.3 kg  03/13/22 44.9 kg  02/26/22 44 kg   Physical Exam General: Alert and oriented, frail and ill-appearing Cardiovascular: S1 S2 clear, RRR.  Respiratory: CTAB, no wheezing, rales or rhonchi Gastrointestinal: Soft, nontender, nondistended, NBS Ext: right BKA Psych: dementia, pleasant   Data Reviewed:  I have personally reviewed following labs    CBC Lab Results  Component Value Date   WBC 8.0 04/22/2022   RBC 2.69 (L) 04/22/2022   HGB 8.4 (L) 04/22/2022   HCT 24.4 (L) 04/22/2022   MCV 90.7 04/22/2022   MCH 31.2 04/22/2022   PLT 118 (L) 04/22/2022   MCHC 34.4 04/22/2022   RDW 17.0 (H) 04/22/2022   LYMPHSABS 1.6 04/22/2022   MONOABS 0.6 04/22/2022   EOSABS 0.3 04/22/2022   BASOSABS 0.0 95/62/1308     Last metabolic panel Lab Results  Component Value Date   NA 124 (L) 04/22/2022   K 3.3 (L) 04/22/2022   CL 98 04/22/2022   CO2 18 (L) 04/22/2022   BUN 17 04/22/2022   CREATININE 2.46 (H) 04/22/2022   GLUCOSE 109 (H) 04/22/2022   GFRNONAA 20 (L) 04/22/2022   CALCIUM 8.0 (L) 04/22/2022   PHOS 3.8 04/22/2022   PROT 4.0 (L) 04/17/2022   ALBUMIN 2.1 (L) 04/22/2022   LABGLOB 2.9 02/13/2021   AGRATIO 1.0 02/13/2021   BILITOT 0.5 04/17/2022   ALKPHOS 74 04/17/2022   AST 15 04/17/2022   ALT  11 04/17/2022   ANIONGAP 8 04/22/2022    CBG (last 3)  Recent Labs    04/22/22 2030 04/23/22 0814 04/23/22 1159  GLUCAP 79 Molly Arroyo       Molly Arroyo M.D. [facility] 04/23/2022, 2:48 PM  Available via Epic secure chat 7am-7pm After 7 pm, please refer to night coverage provider listed on amion.

## 2022-04-24 DIAGNOSIS — R6521 Severe sepsis with septic shock: Secondary | ICD-10-CM | POA: Diagnosis not present

## 2022-04-24 DIAGNOSIS — A419 Sepsis, unspecified organism: Secondary | ICD-10-CM | POA: Diagnosis not present

## 2022-04-24 DIAGNOSIS — Z515 Encounter for palliative care: Secondary | ICD-10-CM | POA: Diagnosis not present

## 2022-04-24 DIAGNOSIS — Z7189 Other specified counseling: Secondary | ICD-10-CM | POA: Diagnosis not present

## 2022-04-24 LAB — GLUCOSE, CAPILLARY
Glucose-Capillary: 115 mg/dL — ABNORMAL HIGH (ref 70–99)
Glucose-Capillary: 121 mg/dL — ABNORMAL HIGH (ref 70–99)
Glucose-Capillary: 155 mg/dL — ABNORMAL HIGH (ref 70–99)
Glucose-Capillary: 66 mg/dL — ABNORMAL LOW (ref 70–99)
Glucose-Capillary: 83 mg/dL (ref 70–99)
Glucose-Capillary: 89 mg/dL (ref 70–99)

## 2022-04-24 LAB — CBC
HCT: 20.6 % — ABNORMAL LOW (ref 36.0–46.0)
Hemoglobin: 7 g/dL — ABNORMAL LOW (ref 12.0–15.0)
MCH: 31.1 pg (ref 26.0–34.0)
MCHC: 34 g/dL (ref 30.0–36.0)
MCV: 91.6 fL (ref 80.0–100.0)
Platelets: 106 10*3/uL — ABNORMAL LOW (ref 150–400)
RBC: 2.25 MIL/uL — ABNORMAL LOW (ref 3.87–5.11)
RDW: 17.6 % — ABNORMAL HIGH (ref 11.5–15.5)
WBC: 10.8 10*3/uL — ABNORMAL HIGH (ref 4.0–10.5)
nRBC: 0 % (ref 0.0–0.2)

## 2022-04-24 LAB — BASIC METABOLIC PANEL
Anion gap: 3 — ABNORMAL LOW (ref 5–15)
BUN: 11 mg/dL (ref 8–23)
CO2: 26 mmol/L (ref 22–32)
Calcium: 7.5 mg/dL — ABNORMAL LOW (ref 8.9–10.3)
Chloride: 99 mmol/L (ref 98–111)
Creatinine, Ser: 2.26 mg/dL — ABNORMAL HIGH (ref 0.44–1.00)
GFR, Estimated: 22 mL/min — ABNORMAL LOW (ref >60)
Glucose, Bld: 142 mg/dL — ABNORMAL HIGH (ref 70–99)
Potassium: 3.2 mmol/L — ABNORMAL LOW (ref 3.5–5.1)
Sodium: 128 mmol/L — ABNORMAL LOW (ref 135–145)

## 2022-04-24 LAB — RENAL FUNCTION PANEL
Albumin: 1.7 g/dL — ABNORMAL LOW (ref 3.5–5.0)
Anion gap: 3 — ABNORMAL LOW (ref 5–15)
BUN: 11 mg/dL (ref 8–23)
CO2: 26 mmol/L (ref 22–32)
Calcium: 7.7 mg/dL — ABNORMAL LOW (ref 8.9–10.3)
Chloride: 99 mmol/L (ref 98–111)
Creatinine, Ser: 2.25 mg/dL — ABNORMAL HIGH (ref 0.44–1.00)
GFR, Estimated: 22 mL/min — ABNORMAL LOW (ref >60)
Glucose, Bld: 142 mg/dL — ABNORMAL HIGH (ref 70–99)
Phosphorus: 1.3 mg/dL — ABNORMAL LOW (ref 2.5–4.6)
Potassium: 3.1 mmol/L — ABNORMAL LOW (ref 3.5–5.1)
Sodium: 128 mmol/L — ABNORMAL LOW (ref 135–145)

## 2022-04-24 MED ORDER — LORAZEPAM 2 MG/ML PO CONC: 1.0000 mg | ORAL | Status: DC | PRN

## 2022-04-24 MED ORDER — GLYCOPYRROLATE 0.2 MG/ML IJ SOLN: 0.2000 mg | INTRAMUSCULAR | Status: DC | PRN

## 2022-04-24 MED ORDER — RENA-VITE PO TABS: 1.0000 | ORAL_TABLET | Freq: Every day | ORAL | Status: DC

## 2022-04-24 MED ORDER — LORAZEPAM 2 MG/ML IJ SOLN: 1.0000 mg | INTRAMUSCULAR | Status: DC | PRN

## 2022-04-24 MED ORDER — ALBUMIN HUMAN 25 % IV SOLN
INTRAVENOUS | Status: AC
  Administered 2022-04-24: 25 g via INTRAVENOUS
  Filled 2022-04-24: qty 50

## 2022-04-24 MED ORDER — ACETAMINOPHEN 325 MG PO TABS
ORAL_TABLET | ORAL | Status: AC
  Administered 2022-04-24: 650 mg via ORAL
  Filled 2022-04-24: qty 2

## 2022-04-24 MED ORDER — HALOPERIDOL 0.5 MG PO TABS: 0.5000 mg | ORAL_TABLET | ORAL | Status: DC | PRN

## 2022-04-24 MED ORDER — ENSURE ENLIVE PO LIQD: 237.0000 mL | Freq: Two times a day (BID) | ORAL | Status: DC

## 2022-04-24 MED ORDER — POTASSIUM CHLORIDE CRYS ER 20 MEQ PO TBCR
20.0000 meq | EXTENDED_RELEASE_TABLET | Freq: Once | ORAL | Status: DC
  Filled 2022-04-24: qty 1

## 2022-04-24 MED ORDER — INSULIN ASPART 100 UNIT/ML IJ SOLN
0.0000 [IU] | Freq: Three times a day (TID) | INTRAMUSCULAR | Status: DC
  Administered 2022-04-25 (×2): 2 [IU] via SUBCUTANEOUS
  Administered 2022-04-26: 3 [IU] via SUBCUTANEOUS
  Administered 2022-04-26: 1 [IU] via SUBCUTANEOUS

## 2022-04-24 MED ORDER — LORAZEPAM 1 MG PO TABS: 1.0000 mg | ORAL_TABLET | ORAL | Status: DC | PRN

## 2022-04-24 MED ORDER — ONDANSETRON 4 MG PO TBDP: 4.0000 mg | ORAL_TABLET | Freq: Four times a day (QID) | ORAL | Status: DC | PRN

## 2022-04-24 MED ORDER — HALOPERIDOL LACTATE 5 MG/ML IJ SOLN: 0.5000 mg | INTRAMUSCULAR | Status: DC | PRN

## 2022-04-24 MED ORDER — HEPARIN SODIUM (PORCINE) 1000 UNIT/ML DIALYSIS: 1000.0000 [IU] | INTRAMUSCULAR | Status: DC | PRN

## 2022-04-24 MED ORDER — ALTEPLASE 2 MG IJ SOLR: 2.0000 mg | Freq: Once | INTRAMUSCULAR | Status: DC | PRN

## 2022-04-24 MED ORDER — HEPARIN SODIUM (PORCINE) 1000 UNIT/ML IJ SOLN
INTRAMUSCULAR | Status: AC
  Administered 2022-04-24: 1000 [IU]
  Filled 2022-04-24: qty 4

## 2022-04-24 MED ORDER — BIOTENE DRY MOUTH MT LIQD: 15.0000 mL | OROMUCOSAL | Status: DC | PRN

## 2022-04-24 MED ORDER — FENTANYL CITRATE PF 50 MCG/ML IJ SOSY
12.5000 ug | PREFILLED_SYRINGE | INTRAMUSCULAR | Status: DC | PRN
  Administered 2022-04-24: 12.5 ug via INTRAVENOUS
  Filled 2022-04-24: qty 1

## 2022-04-24 MED ORDER — ANTICOAGULANT SODIUM CITRATE 4% (200MG/5ML) IV SOLN: 5.0000 mL | Status: DC | PRN

## 2022-04-24 MED ORDER — ONDANSETRON HCL 4 MG/2ML IJ SOLN: 4.0000 mg | Freq: Four times a day (QID) | INTRAMUSCULAR | Status: DC | PRN

## 2022-04-24 MED ORDER — ACETAMINOPHEN 650 MG RE SUPP
650.0000 mg | Freq: Four times a day (QID) | RECTAL | Status: DC | PRN
  Filled 2022-04-24: qty 1

## 2022-04-24 MED ORDER — GLYCOPYRROLATE 1 MG PO TABS: 1.0000 mg | ORAL_TABLET | ORAL | Status: DC | PRN

## 2022-04-24 MED ORDER — POLYVINYL ALCOHOL 1.4 % OP SOLN: 1.0000 [drp] | Freq: Four times a day (QID) | OPHTHALMIC | Status: DC | PRN

## 2022-04-24 MED ORDER — HALOPERIDOL LACTATE 2 MG/ML PO CONC
0.5000 mg | ORAL | Status: DC | PRN
  Filled 2022-04-24: qty 5

## 2022-04-24 MED ORDER — ACETAMINOPHEN 325 MG PO TABS: 650.0000 mg | ORAL_TABLET | Freq: Four times a day (QID) | ORAL | Status: DC | PRN

## 2022-04-24 MED ORDER — INSULIN ASPART 100 UNIT/ML IJ SOLN: 0.0000 [IU] | Freq: Every day | INTRAMUSCULAR | Status: DC
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
## 2022-04-24 NOTE — Progress Notes (Signed)
Initial Nutrition Assessment  DOCUMENTATION CODES:  Severe malnutrition in context of chronic illness  INTERVENTION:  Continue current diet as ordered, encourage PO intake Start renavite daily Ensure Enlive po BID, each supplement provides 350 kcal and 20 grams of protein.  NUTRITION DIAGNOSIS:  Severe Malnutrition related to chronic illness (ESRD on HD) as evidenced by severe fat depletion, severe muscle depletion.  GOAL:  Patient will meet greater than or equal to 90% of their needs  MONITOR:  PO intake, Supplement acceptance, Weight trends, I & O's  REASON FOR ASSESSMENT:  Consult Assessment of nutrition requirement/status  ASSESSMENT:  Pt with hx of ESRD on HD, dementia, DM type 2, right BKA, HTN, and hypothyroidism presented to ED from HD with hypotension and lethargy. Found to be septic secondary to pneumonia.  Pt in recliner at the time of assessment. Briefly opened her eyes during physical exam, otherwise did not interact. RN reports that she was interactive this AM while her NT was feeding her. Breakfast tray at bedside noted to be mostly consumed. Meal intake ranges from 10-50% consumed since admission.   Pt has significant muscle and fat deficits present on exam. Would benefit from a nutrition supplement and MVI. Noted that care team is attempting to reach family to have goals of care discussions. Has been on HD for a couple of months and lives at SNF at baseline.  EDW: 42.5 kg Net IO Since Admission: 2,545.37 mL [04/24/22 1310]   Average Meal Intake: 7/26-8/3: 31% intake x 7 recorded meals  Nutritionally Relevant Medications: Scheduled Meds:  insulin aspart  0-6 Units Subcutaneous TID WC   psyllium  1 packet Oral Daily   Labs Reviewed: Na 124 K 3.3 Creatinine 2.46 CBG ranges from 66-191 mg/dL over the last 24 hours  NUTRITION - FOCUSED PHYSICAL EXAM:  Flowsheet Row Most Recent Value  Orbital Region Severe depletion  Upper Arm Region Severe depletion   Thoracic and Lumbar Region Severe depletion  Buccal Region Severe depletion  Temple Region Severe depletion  Clavicle Bone Region Moderate depletion  Clavicle and Acromion Bone Region Severe depletion  Scapular Bone Region Severe depletion  Dorsal Hand Moderate depletion  Patellar Region Severe depletion  Anterior Thigh Region Severe depletion  Posterior Calf Region Severe depletion  [right BKA]  Edema (RD Assessment) None  Hair Reviewed  Eyes Reviewed  Mouth Reviewed  Skin Reviewed  [ecchymosis]  Nails Reviewed    Diet Order:   Diet Order             DIET DYS 3 Room service appropriate? Yes; Fluid consistency: Thin  Diet effective now                   EDUCATION NEEDS:   Not appropriate for education at this time  Skin:  Skin Assessment: Skin Integrity Issues: Skin Integrity Issues:: Stage II Stage II: sacrum and buttocks  Last BM:  8/2  Height:  Ht Readings from Last 1 Encounters:  04/24/22 5' 2.01" (1.575 m)   Weight:  Wt Readings from Last 1 Encounters:  04/24/22 23 kg   Adjusted Ideal Body Weight:  47.1 kg (hx BKA)  BMI:  Body mass index is 9.27 kg/m. Invalid - wt not accurate  Estimated Nutritional Needs:  Kcal:  1400-1600 kcal/d Protein:  70-90g/d Fluid:  1L+UOP    Ranell Patrick, RD, LDN Clinical Dietitian RD pager # available in AMION  After hours/weekend pager # available in [facility]

## 2022-04-24 NOTE — Progress Notes (Signed)
Received information from pt's out-pt HD clinic with pt's daughter name and phone number. This information was provided to treatment team via secure chat.   Melven Sartorius Renal Navigator [phone]

## 2022-04-24 NOTE — Progress Notes (Signed)
Received patient in bed to unit.  Alert and oriented.  Informed consent signed and in  chart.   Treatment initiated: 1418 Treatment completed: 1757  Patient tolerated well.  Transported back to the room  alert, without acute distress.  Hand-off given to patient's nurse.   Access used: HD cath Access issues: none  Total UF removed: 1562ml Medication(s) given: Heparin 2,000 unit bolus, Albumin 25% 25G IVPB, heparin dwells 3800 units Post HD VS: 97.9 ax 120/59   94 16    94% RA Post HD weight: UTA d/t bed not zeroed, let primary nurse know that they would need to get pt up from bed to zero it out    [facility]

## 2022-04-24 NOTE — Progress Notes (Signed)
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  On presentation, she was found to be profoundly hypertensive.  Chest CT showed right lower lobe and right middle lobe pneumonia.  Head CT negative.  Patient was placed on vasopressors for severe sepsis and admitted to ICU.  After achieving hemodynamically stability, she was transferred to [facility] service.  Palliative care also following for goals of care.  Unable to reach family.  Nephrology following for dialysis.PT/OT recommending skilled nursing facility on discharge.  TOC following.  Assessment & Plan:  Principal Problem:   Septic shock ([place]) Active Problems:   Pressure injury of skin   Multifocal pneumonia  Severe sepsis with septic shock secondary to multifocal pneumonia: Presented with encephalopathy, hypotension, hypothermia, leukocytosis, lactic acidosis.  Chest CT as above.  Blood cultures negative so far .  She was admitted to ICU on presentation for vasopressors.  Sepsis physiology has resolved.  Completed antibiotics course.  She remains on room air without any respiratory distress.  ESRD on dialysis: Nephrology consulted and following.  Hemodialysis as per nephrology.  She is on midodrine for hypotension. Home antihypertensives on hold.  Hyponatremia/hypokalemia: Currently being monitored and supplemented potassium as needed.  Hyponatremia is most likely secondary to volume overload.  Volume management as per dialysis.  Diabetes type 2: Currently on sliding scale insulin.  Monitor blood sugars.  Takes Tradjenta at home  Episode of SVT: Happened on 7/26.  Currently in normal sinus rhythm Monitor in telemetry.  Anemia of  chronic renal disease: Secondary to ESRD.  Currently hemoglobin stable.  Also has chronic mild thrombocytopenia  Moderate protein calorie malnutrition/hypoalbuminemia: We will request for nutritionist consult  Dementia/deconditioning/poor prognosis: Palliative care consulted for goals of care due to her poor prognosis.  No success on contacting family.  Waiting for family discussion for goals of care.  Overall prognosis poor, patient is extremely frail, demented and also on dialysis. PT/OT has recommended skilled nursing facility on discharge.  Pressure Injury 04/15/22 Sacrum Mid Stage 2 -  Partial thickness loss of dermis presenting as a shallow open injury with a red, pink wound bed without slough. (Active)  04/15/22 1856  Location: Sacrum  Location Orientation: Mid  Staging: Stage 2 -  Partial thickness loss of dermis presenting as a shallow open injury with a red, pink wound bed without slough.  Wound Description (Comments):   Present on Admission: Yes     Pressure Injury 04/15/22 Buttocks Right Stage 2 -  Partial thickness loss of dermis presenting as a shallow open injury with a red, pink wound bed without slough. skin broken (Active)  04/15/22 1857  Location: Buttocks  Location Orientation: Right  Staging: Stage 2 -  Partial thickness loss of dermis presenting as a shallow open injury with a red, pink wound bed without slough.  Wound Description (Comments): skin broken  Present on Admission: Yes           Pressure Injury 04/15/22 Sacrum Mid Stage 2 -  Partial thickness loss of dermis presenting as a shallow open injury with a red, pink wound bed without slough. (Active)  04/15/22 1856  Location: Sacrum  Location Orientation: Mid  Staging: Stage 2 -  Partial thickness loss of dermis presenting as a shallow open  injury with a red, pink wound bed without slough.  Wound Description (Comments):   Present on Admission: Yes  Dressing Type Foam - Lift dressing to assess site every  shift 04/23/22 2040     Pressure Injury 04/15/22 Buttocks Right Stage 2 -  Partial thickness loss of dermis presenting as a shallow open injury with a red, pink wound bed without slough. skin broken (Active)  04/15/22 1857  Location: Buttocks  Location Orientation: Right  Staging: Stage 2 -  Partial thickness loss of dermis presenting as a shallow open injury with a red, pink wound bed without slough.  Wound Description (Comments): skin broken  Present on Admission: Yes  Dressing Type Foam - Lift dressing to assess site every shift 04/23/22 2040    DVT prophylaxis:heparin injection 5,000 Units Start: 04/15/22 2200 SCDs Start: 04/15/22 1730     Code Status: Full Code  Family Communication:   Patient status:Inpatient  Patient is from :SNF: [facility] in [place]  Anticipated discharge to:SNF  Estimated DC date:Not sure   Consultants: Nephrology, PCCM, vascular surgery, palliative care  Procedures: Dialysis  Antimicrobials:  Anti-infectives (From admission, onward)    Start     Dose/Rate Route Frequency Ordered Stop   04/17/22 1700  vancomycin (VANCOREADY) IVPB 500 mg/100 mL  Status:  Discontinued        500 mg 100 mL/hr over 60 Minutes Intravenous Every 48 hours 04/15/22 1816 04/15/22 1817   04/16/22 2200  ceFEPIme (MAXIPIME) 1 g in sodium chloride 0.9 % 100 mL IVPB        1 g 200 mL/hr over 30 Minutes Intravenous Every 24 hours 04/15/22 1818 04/20/22 2221   04/16/22 1600  ceFEPIme (MAXIPIME) 2 g in sodium chloride 0.9 % 100 mL IVPB  Status:  Discontinued        2 g 200 mL/hr over 30 Minutes Intravenous Every 24 hours 04/15/22 1816 04/15/22 1818   04/16/22 0802  vancomycin variable dose per unstable renal function (pharmacist dosing)  Status:  Discontinued         Does not apply See admin instructions 04/16/22 0803 04/17/22 1009   04/15/22 1445  ceFEPIme (MAXIPIME) 2 g in sodium chloride 0.9 % 100 mL IVPB        2 g 200 mL/hr over 30 Minutes Intravenous  Once  04/15/22 1438 04/15/22 1703   04/15/22 1445  metroNIDAZOLE (FLAGYL) IVPB 500 mg        500 mg 100 mL/hr over 60 Minutes Intravenous  Once 04/15/22 1438 04/15/22 1703   04/15/22 1445  vancomycin (VANCOCIN) IVPB 1000 mg/200 mL premix        1,000 mg 200 mL/hr over 60 Minutes Intravenous  Once 04/15/22 1438 04/15/22 1821       Subjective: Patient seen and examined at the bedside this morning.  Hemodynamically stable.  Lying on bed.  Appears comfortable.  Denies any new complaints.  She was alert and oriented today, and knew the current day and month.  Objective: Vitals:   04/23/22 1619 04/23/22 1938 04/24/22 0431 04/24/22 0808  BP: 100/70 124/68 124/70 123/63  Pulse: 95 92 97 83  Resp: 19 18 18 16   Temp: 98.8 F (37.1 C) 98.7 F (37.1 C) 98.8 F (37.1 C) 98.4 F (36.9 C)  TempSrc: Oral Oral Oral Oral  SpO2:  100% 100% 92%  Weight:   23 kg     Intake/Output Summary (Last 24 hours) at 04/24/2022 0858 Last data filed at 04/24/2022 0856 Gross per 24  hour  Intake 1000 ml  Output --  Net 1000 ml   Filed Weights   04/19/22 0822 04/19/22 1209 04/24/22 0431  Weight: 41.8 kg 41.3 kg 23 kg    Examination:  General exam: Overall comfortable, not in distress, deconditioned, frail HEENT: PERRL, central line on the left neck Respiratory system: Crackles on the right base Cardiovascular system: S1 & S2 heard, RRR.  Dialysis catheter on the right chest Gastrointestinal system: Abdomen is nondistended, soft and nontender. Central nervous system: Alert and oriented Extremities: No edema, no clubbing ,no cyanosis, right BKA Skin: No rashes,no icterus, pressure ulcer as above   Data Reviewed: I have personally reviewed following labs and imaging studies  CBC: Recent Labs  Lab 04/22/22 0517  WBC 8.0  NEUTROABS 5.4  HGB 8.4*  HCT 24.4*  MCV 90.7  PLT 272*   Basic Metabolic Panel: Recent Labs  Lab 04/17/22 2200 04/18/22 1246 04/19/22 0512 04/19/22 2000 04/20/22 0348  04/20/22 0822 04/22/22 0517  NA 131* 129* 130* 132* 132*  --  124*  K 2.7* 3.5 2.9* 4.3 3.7  --  3.3*  CL 101 103 106 102 103  --  98  CO2 24 21* 20* 19* 24  --  18*  GLUCOSE 84 117* 86 244* 155*  --  109*  BUN 9 9 10  5* 7*  --  17  CREATININE 1.59* 1.84* 2.15* 1.54* 1.66*  --  2.46*  CALCIUM 7.1* 7.6* 7.9* 7.8* 7.6*  --  8.0*  MG 2.0 2.1  --   --   --   --   --   PHOS  --   --   --  <1.0*  --  4.1 3.8     Recent Results (from the past 240 hour(s))  Blood Culture (routine x 2)     Status: None   Collection Time: 04/15/22  2:48 PM   Specimen: BLOOD RIGHT FOREARM  Result Value Ref Range Status   Specimen Description BLOOD RIGHT FOREARM  Final   Special Requests   Final    BOTTLES DRAWN AEROBIC AND ANAEROBIC Blood Culture adequate volume   Culture   Final    NO GROWTH 5 DAYS Performed at [facility], [address], [place], [place] [address]    Report Status 04/20/2022 FINAL  Final  Blood Culture (routine x 2)     Status: None   Collection Time: 04/15/22  3:52 PM   Specimen: BLOOD  Result Value Ref Range Status   Specimen Description BLOOD SITE NOT SPECIFIED  Final   Special Requests   Final    BOTTLES DRAWN AEROBIC AND ANAEROBIC Blood Culture adequate volume   Culture   Final    NO GROWTH 5 DAYS Performed at [facility], [place] [address], [place], [place] [address]    Report Status 04/20/2022 FINAL  Final  MRSA Next Gen by PCR, Nasal     Status: Abnormal   Collection Time: 04/15/22  6:44 PM   Specimen: Nasal Mucosa; Nasal Swab  Result Value Ref Range Status   MRSA by PCR Next Gen DETECTED (A) NOT DETECTED Final    Comment: RESULT CALLED TO, READ BACK BY AND VERIFIED WITH: RN SARAH DIENER ON 04/15/22 @ 2024 BY DRT (NOTE) The GeneXpert MRSA Assay (FDA approved for NASAL specimens only), is one component of a comprehensive MRSA colonization surveillance program. It is not intended to diagnose MRSA infection nor to guide or monitor treatment for MRSA  infections.  Test performance is not FDA approved in patients less than 85 years old. Performed at [facility], [place] [address], [place], [place] [address]      Radiology Studies: No results found.  Scheduled Meds:  Chlorhexidine Gluconate Cloth  6 each Topical Q0600   Chlorhexidine Gluconate Cloth  6 each Topical Q0600   Chlorhexidine Gluconate Cloth  6 each Topical Q0600   darbepoetin (ARANESP) injection - DIALYSIS  40 mcg Intravenous Q Mon-HD   Gerhardt's butt cream   Topical QID   heparin  2,000 Units Dialysis Once in dialysis   heparin  5,000 Units Subcutaneous Q8H   insulin aspart  0-6 Units Subcutaneous TID WC   midodrine  10 mg Oral TID WC   psyllium  1 packet Oral Daily   sodium chloride flush  10-40 mL Intracatheter Q12H   Continuous Infusions:  albumin human Stopped (04/17/22 1100)     LOS: 9 days   Shelly Coss, MD [facility] P8/10/2021, 8:58 AM

## 2022-04-24 NOTE — Progress Notes (Signed)
PT Cancellation Note  Patient Details Name: Molly Arroyo MRN: [id number] DOB: 1946-02-08   Cancelled Treatment:    Reason Eval/Treat Not Completed: Patient at procedure or test/unavailable Transport present to take down for HD. PT will re-attempt as time allows.   Molly Arroyo A. Gilford Rile PT, DPT Acute Rehabilitation Services Office [phone]    Linna Hoff 04/24/2022, 2:08 PM
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
## 2022-04-24 NOTE — Care Management (Addendum)
13:30 Attempted to reach patient's daughter Shawnae Leiva at [phone]. Call went to VM, and VM was full, unable to leave massage. HIPPA compliant text, with no patient identifying information, sent to number requesting callback from "[facility]" sent. Awaiting call back 16:00 Reached out to patient's daughter again, she answered. She verified that she would like [facility] facility in [place]. Referral made to Cheri who will call Robin. Shirlean Mylar is aware that she will be called and that she will need to answer her phone.

## 2022-04-25 DIAGNOSIS — A419 Sepsis, unspecified organism: Secondary | ICD-10-CM | POA: Diagnosis not present

## 2022-04-25 DIAGNOSIS — R6521 Severe sepsis with septic shock: Secondary | ICD-10-CM | POA: Diagnosis not present

## 2022-04-25 LAB — GLUCOSE, CAPILLARY
Glucose-Capillary: 118 mg/dL — ABNORMAL HIGH (ref 70–99)
Glucose-Capillary: 120 mg/dL — ABNORMAL HIGH (ref 70–99)
Glucose-Capillary: 142 mg/dL — ABNORMAL HIGH (ref 70–99)
Glucose-Capillary: 196 mg/dL — ABNORMAL HIGH (ref 70–99)
Glucose-Capillary: 201 mg/dL — ABNORMAL HIGH (ref 70–99)
Glucose-Capillary: 99 mg/dL (ref 70–99)

## 2022-04-25 MED ORDER — LIDOCAINE-EPINEPHRINE 1 %-1:100000 IJ SOLN
20.0000 mL | Freq: Once | INTRAMUSCULAR | Status: DC
  Filled 2022-04-25: qty 20

## 2022-04-25 NOTE — Progress Notes (Signed)
Matilde Bash who is pt 's niece called and provided some more contact numbers including hers and another family member's who is pt's sister. The listed daughter Shirlean Mylar has some phone/signal issues and is not the best contact and all communications should be made to [facility] per her request. Contact details updated in chart

## 2022-04-25 NOTE — Progress Notes (Signed)
8/3 IM Letter mailed to the patient's address on file due to the nature of patient's present health state. IM Letter previously given to patient.

## 2022-04-25 NOTE — Discharge Summary (Signed)
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  On presentation, she was found to be profoundly hypertensive.  Chest CT showed right lower lobe and right middle lobe pneumonia.  Head CT negative.  Patient was placed on vasopressors for severe sepsis and admitted to ICU.  After achieving hemodynamically stability, she was transferred to [facility] service.  Palliative care also following for goals of care.  Due to her multiple comorbidities, advanced age, nephrology recommended hospice approach/stopping of dialysis.  We discussed with family, daughter wants to make her comfort care.  Initiated full comfort care.  Plan is to send her to residential hospice.  Following problems were addressed during her hospitalization:  Severe sepsis with septic shock secondary to multifocal pneumonia: Presented with encephalopathy, hypotension, hypothermia, leukocytosis, lactic acidosis.  Chest CT as above.  Blood cultures negative so far .  She was admitted to ICU on presentation for vasopressors.  Sepsis physiology has resolved.  Completed antibiotics course.  She remains on room air without any respiratory distress.   ESRD on dialysis: Nephrology were consulted and following.  She underwent dialysis here.  Now plan is to stop dialysis, seeking comfort care    Diabetes type 2:  Takes Tradjenta at home   Episode of SVT: Happened on 7/26.  Currently in normal sinus rhythm   Anemia of chronic renal disease:  Secondary to ESRD.  Currently hemoglobin stable.  Also has chronic mild thrombocytopenia    Dementia/deconditioning/poor prognosis: Palliative care consulted for goals of care due to her poor prognosis.  Discussed with daughter, she wants to transition her care to full comfort, residential hospice placement     Discharge Diagnoses:  Principal Problem:   Septic shock [facility]) Active Problems:   Pressure injury of skin   Multifocal pneumonia    Discharge Instructions  Discharge Instructions     Diet general   Complete by: As directed    Increase activity slowly   Complete by: As directed    No wound care   Complete by: As directed       Allergies as of 04/25/2022   No Known Allergies      Medication List     STOP taking these medications    acetaminophen 325 MG tablet Commonly known as: TYLENOL   amLODipine 10 MG tablet Commonly known as: NORVASC   atenolol 50 MG tablet Commonly known as: TENORMIN   busPIRone 10 MG tablet Commonly known as: BUSPAR   clopidogrel 75 MG tablet Commonly known as: PLAVIX   cyanocobalamin 500 MCG tablet Commonly known as: VITAMIN B12   furosemide 20 MG tablet Commonly known as: LASIX   Glucagon Emergency 1 MG Kit   HYDROcodone-acetaminophen 5-325 MG tablet Commonly known as: Norco   Insta-Glucose 77.4 % Gel   NUTRITIONAL DRINK PO   ondansetron 4 MG tablet Commonly known as: ZOFRAN   PARoxetine 40 MG tablet Commonly known as: PAXIL   RENA-VITE PO   rOPINIRole 1 MG tablet Commonly known as: REQUIP   sevelamer carbonate 800 MG tablet Commonly known as: RENVELA   Tradjenta 5 MG Tabs tablet Generic drug: linagliptin  traZODone 50 MG tablet Commonly known as: DESYREL   Vitamin D3 1.25 MG (50000 UT) Caps   Zinc 50 MG Tabs        No Known Allergies  Consultations: Nephrology, palliative care   Procedures/Studies: CT CHEST ABDOMEN PELVIS WO CONTRAST  Result Date: 04/15/2022 CLINICAL DATA:  Sepsis  EXAM: CT CHEST, ABDOMEN AND PELVIS WITHOUT CONTRAST TECHNIQUE: Multidetector CT imaging of the chest, abdomen and pelvis was performed following the standard protocol without IV contrast. RADIATION DOSE REDUCTION: This exam was performed according to the departmental dose-optimization program which includes automated exposure control, adjustment of the mA and/or kV according to patient size and/or use of iterative reconstruction technique. COMPARISON:  Chest radiography same day.  CT 08/29/2021. FINDINGS: CT CHEST FINDINGS Cardiovascular: Heart size is normal. No pericardial fluid. Coronary artery calcification and aortic atherosclerotic calcification as seen previously. Left internal jugular catheter with tip at the SVC RA junction. Mediastinum/Nodes: No mediastinal or hilar mass or lymphadenopathy seen on this study done without contrast. Lungs/Pleura: Right lower lobe collapse/pneumonia. Right middle lobe collapse. Right upper lobe is clear. Small amount of layering pleural fluid on the right. On the left, there is a small amount of layering pleural fluid with dependent atelectasis. Patchy perihilar pneumonia in the lingula and left upper lobe. Musculoskeletal: No thoracic spinal fracture.  No rib fracture. CT ABDOMEN PELVIS FINDINGS Hepatobiliary: Liver parenchyma appears normal. No calcified gallstones. Pancreas: Chronic calcifications throughout the pancreas consistent with previous pancreatitis. No evidence of acute inflammation. Spleen: Normal Adrenals/Urinary Tract: Adrenal glands are normal. Multiple indeterminate renal lesions as seen previously that could represent cysts. Mass is not excluded. As noted previously, MRI could be performed to more accurately characterize the renal lesions. Mild wall thickening of the bladder. Cystitis not excluded. Stomach/Bowel: Stomach and small intestine are normal. No evidence of acute colon pathology. The patient does have a large amount of fecal matter in the rectum  but no inflammatory changes. Vascular/Lymphatic: Aortic atherosclerosis. No aneurysm. IVC is normal. No adenopathy. Reproductive: No pelvic mass. Other: No free fluid or air. Nonspecific body wall edema/anasarca, particularly along the flanks. Musculoskeletal: Chronic degenerative changes throughout the lumbar region. IMPRESSION: Right lower lobe and right middle lobe pneumonia/volume loss. Patchy infiltrates in the lingula and left upper lobe. Pleural effusions layering dependently. Coronary artery calcification and aortic atherosclerotic calcification. No acute intra-abdominal finding. No sign of bowel inflammatory disease. Thick-walled bladder.  Cystitis not excluded. Chronic pancreatic calcifications. Chronic renal lesions. These may simply be cysts, but they cannot be accurately characterize. As recommended previously, renal/abdominal MRI could be performed electively to accurately characterize these renal lesions. Nonspecific body wall edema/anasarca. Electronically Signed   By: Nelson Chimes M.D.   On: 04/15/2022 16:49   CT HEAD WO CONTRAST  Result Date: 04/15/2022 CLINICAL DATA:  Mental status change, unknown cause EXAM: CT HEAD WITHOUT CONTRAST TECHNIQUE: Contiguous axial images were obtained from the base of the skull through the vertex without intravenous contrast. RADIATION DOSE REDUCTION: This exam was performed according to the departmental dose-optimization program which includes automated exposure control, adjustment of the mA and/or kV according to patient size and/or use of iterative reconstruction technique. COMPARISON:  Head CT 08/28/2021 FINDINGS: Brain: No evidence of acute intracranial hemorrhage or extra-axial collection.Focal small area of encephalomalacia in the high convexity right parietal lobe compatible with prior small cortical infarct.The ventricles are unchanged in size.Scattered subcortical and periventricular white matter hypodensities, nonspecific but likely sequela of chronic  small vessel ischemic disease.Mild cerebral atrophy Vascular:  No hyperdense vessel.  Vascular calcifications. Skull: Negative for skull fracture. Sinuses/Orbits: Mild ethmoid air cell mucosal thickening. Mastoid air cells are clear. Orbits are unremarkable. Other: None. IMPRESSION: No acute intracranial abnormality. Mild sequela of chronic small vessel ischemic disease. Electronically Signed   By: Maurine Simmering M.D.   On: 04/15/2022 16:38   DG Chest Port 1 View  Result Date: 04/15/2022 CLINICAL DATA:  Syncope. EXAM: PORTABLE CHEST 1 VIEW COMPARISON:  10/19/2021 and CT chest 08/28/2021. FINDINGS: Patient is rotated. Trachea is midline. Right IJ dialysis catheter tip is in the low right atrium. Heart size stable. Thoracic aorta is calcified. Lungs are low in volume with an elevated right hemidiaphragm. Bibasilar streaky atelectasis, left greater than right. IMPRESSION: Bibasilar streaky atelectasis, left greater than right Electronically Signed   By: Lorin Picket M.D.   On: 04/15/2022 14:30      Subjective: Patient seen and examined at the bedside this morning.  Hemodynamically stable, comfortable, sitting in the chair.  On full comfort care.  Very deconditioned  Discharge Exam: Vitals:   04/25/22 0434 04/25/22 0822  BP: 125/61 134/66  Pulse: 94 (!) 110  Resp: 17 16  Temp: 98.9 F (37.2 C) 100.3 F (37.9 C)  SpO2: 99% 97%   Vitals:   04/24/22 1800 04/24/22 1837 04/25/22 0434 04/25/22 0822  BP: (!) 120/59 107/74 125/61 134/66  Pulse: 94 84 94 (!) 110  Resp: 16  17 16   Temp: 97.9 F (36.6 C) 97.6 F (36.4 C) 98.9 F (37.2 C) 100.3 F (37.9 C)  TempSrc: Axillary  Oral Oral  SpO2: 94% 97% 99% 97%  Weight:      Height:        General: Pt is alert, awake, not in acute distress, very deconditioned, chronically Cardiovascular: RRR, S1/S2 +, no rubs, no gallops Respiratory: CTA bilaterally, no wheezing, no rhonchi Abdominal: Soft, NT, ND, bowel sounds + Extremities: no edema, no  cyanosis    The results of significant diagnostics from this hospitalization (including imaging, microbiology, ancillary and laboratory) are listed below for reference.     Microbiology: Recent Results (from the past 240 hour(s))  Blood Culture (routine x 2)     Status: None   Collection Time: 04/15/22  2:48 PM   Specimen: BLOOD RIGHT FOREARM  Result Value Ref Range Status   Specimen Description BLOOD RIGHT FOREARM  Final   Special Requests   Final    BOTTLES DRAWN AEROBIC AND ANAEROBIC Blood Culture adequate volume   Culture   Final    NO GROWTH 5 DAYS Performed at [facility], [address] [place], [place] [address]    Report Status 04/20/2022 FINAL  Final  Blood Culture (routine x 2)     Status: None   Collection Time: 04/15/22  3:52 PM   Specimen: BLOOD  Result Value Ref Range Status   Specimen Description BLOOD SITE NOT SPECIFIED  Final   Special Requests   Final    BOTTLES DRAWN AEROBIC AND ANAEROBIC Blood Culture adequate volume   Culture   Final    NO GROWTH 5 DAYS Performed at [facility], [place] [address] [place],  [address]    Report Status 04/20/2022 FINAL  Final  MRSA Next Gen by PCR, Nasal     Status: Abnormal   Collection Time: 04/15/22  6:44 PM   Specimen: Nasal Mucosa; Nasal Swab  Result Value Ref Range Status   MRSA by PCR Next Gen DETECTED (A) NOT DETECTED  Final    Comment: RESULT CALLED TO, READ BACK BY AND VERIFIED WITH: RN SARAH DIENER ON 04/15/22 @ 2024 BY DRT (NOTE) The GeneXpert MRSA Assay (FDA approved for NASAL specimens only), is one component of a comprehensive MRSA colonization surveillance program. It is not intended to diagnose MRSA infection nor to guide or monitor treatment for MRSA infections. Test performance is not FDA approved in patients less than 41 years old. Performed at [facility], [place] [address], [place], [place] [address]      Labs: BNP (last 3 results) No results for input(s): "BNP" in the  last 8760 hours. Basic Metabolic Panel: Recent Labs  Lab 04/18/22 1246 04/19/22 0512 04/19/22 2000 04/20/22 0348 04/20/22 0822 04/22/22 0517 04/24/22 1204  NA 129* 130* 132* 132*  --  124* 128*  128*  K 3.5 2.9* 4.3 3.7  --  3.3* 3.2*  3.1*  CL 103 106 102 103  --  98 99  99  CO2 21* 20* 19* 24  --  18* 26  26  GLUCOSE 117* 86 244* 155*  --  109* 142*  142*  BUN 9 10 5* 7*  --  17 11  11   CREATININE 1.84* 2.15* 1.54* 1.66*  --  2.46* 2.26*  2.25*  CALCIUM 7.6* 7.9* 7.8* 7.6*  --  8.0* 7.5*  7.7*  MG 2.1  --   --   --   --   --   --   PHOS  --   --  <1.0*  --  4.1 3.8 1.3*   Liver Function Tests: Recent Labs  Lab 04/19/22 2000 04/22/22 0517 04/24/22 1204  ALBUMIN 2.4* 2.1* 1.7*   No results for input(s): "LIPASE", "AMYLASE" in the last 168 hours. No results for input(s): "AMMONIA" in the last 168 hours. CBC: Recent Labs  Lab 04/22/22 0517 04/24/22 1204  WBC 8.0 10.8*  NEUTROABS 5.4  --   HGB 8.4* 7.0*  HCT 24.4* 20.6*  MCV 90.7 91.6  PLT 118* 106*   Cardiac Enzymes: No results for input(s): "CKTOTAL", "CKMB", "CKMBINDEX", "TROPONINI" in the last 168 hours. BNP: Invalid input(s): "POCBNP" CBG: Recent Labs  Lab 04/24/22 1121 04/24/22 2019 04/25/22 0002 04/25/22 0433 04/25/22 0812  GLUCAP 83 155* 99 142* 118*   D-Dimer No results for input(s): "DDIMER" in the last 72 hours. Hgb A1c No results for input(s): "HGBA1C" in the last 72 hours. Lipid Profile No results for input(s): "CHOL", "HDL", "LDLCALC", "TRIG", "CHOLHDL", "LDLDIRECT" in the last 72 hours. Thyroid function studies No results for input(s): "TSH", "T4TOTAL", "T3FREE", "THYROIDAB" in the last 72 hours.  Invalid input(s): "FREET3" Anemia work up No results for input(s): "VITAMINB12", "FOLATE", "FERRITIN", "TIBC", "IRON", "RETICCTPCT" in the last 72 hours. Urinalysis    Component Value Date/Time   COLORURINE YELLOW 02/11/2021 1540   APPEARANCEUR TURBID (A) 02/11/2021 1540    LABSPEC 1.009 02/11/2021 1540   PHURINE 5.0 02/11/2021 1540   GLUCOSEU >=500 (A) 02/11/2021 1540   HGBUR MODERATE (A) 02/11/2021 1540   BILIRUBINUR NEGATIVE 02/11/2021 [place] 02/11/2021 1540   PROTEINUR >=300 (A) 02/11/2021 1540   NITRITE NEGATIVE 02/11/2021 1540   LEUKOCYTESUR LARGE (A) 02/11/2021 1540   Sepsis Labs Recent Labs  Lab 04/22/22 0517 04/24/22 1204  WBC 8.0 10.8*   Microbiology Recent Results (from the past 240 hour(s))  Blood Culture (routine x 2)     Status: None   Collection Time: 04/15/22  2:48 PM   Specimen: BLOOD RIGHT  FOREARM  Result Value Ref Range Status   Specimen Description BLOOD RIGHT FOREARM  Final   Special Requests   Final    BOTTLES DRAWN AEROBIC AND ANAEROBIC Blood Culture adequate volume   Culture   Final    NO GROWTH 5 DAYS Performed at [facility], [address] [place], [place] [address]    Report Status 04/20/2022 FINAL  Final  Blood Culture (routine x 2)     Status: None   Collection Time: 04/15/22  3:52 PM   Specimen: BLOOD  Result Value Ref Range Status   Specimen Description BLOOD SITE NOT SPECIFIED  Final   Special Requests   Final    BOTTLES DRAWN AEROBIC AND ANAEROBIC Blood Culture adequate volume   Culture   Final    NO GROWTH 5 DAYS Performed at [facility], [place] [address], [place], [place] [address]    Report Status 04/20/2022 FINAL  Final  MRSA Next Gen by PCR, Nasal     Status: Abnormal   Collection Time: 04/15/22  6:44 PM   Specimen: Nasal Mucosa; Nasal Swab  Result Value Ref Range Status   MRSA by PCR Next Gen DETECTED (A) NOT DETECTED Final    Comment: RESULT CALLED TO, READ BACK BY AND VERIFIED WITH: RN SARAH DIENER ON 04/15/22 @ 2024 BY DRT (NOTE) The GeneXpert MRSA Assay (FDA approved for NASAL specimens only), is one component of a comprehensive MRSA colonization surveillance program. It is not intended to diagnose MRSA infection nor to guide or monitor treatment for MRSA  infections. Test performance is not FDA approved in patients less than 41 years old. Performed at [facility], [place] [address] [place], [place] [address]     Please note: You were cared for by a hospitalist during your hospital stay. Once you are discharged, your primary care physician will handle any further medical issues. Please note that NO REFILLS for any discharge medications will be authorized once you are discharged, as it is imperative that you return to your primary care physician (or establish a relationship with a primary care physician if you do not have one) for your post hospital discharge needs so that they can reassess your need for medications and monitor your lab values.    Time coordinating discharge: 40 minutes  SIGNED:   Shelly Coss, MD  [facility] 04/25/2022, 11:01 AM Pager [phone]  If 7PM-7AM, please contact night-coverage [email or web address] Password [email or web address]

## 2022-04-25 NOTE — TOC CM/SW Note (Addendum)
[facility] has a bed available , but has been unsuccessful in reaching daughter.   1444 NCM has called Robin [phone] no answer and voicemail box full. NCM has also texted Robin. Patient cannot be discharged until [place] has spoken to Shirlean Mylar and she consents to admission at [facility] and signs consents   [facility] again no answer and voicemail box is full. Also sent another text asking her to call NCM

## 2022-04-25 NOTE — Progress Notes (Signed)
Patient CG:[id number] Derk      DOB: 08-16-1946      LUD:[id number]      Palliative Medicine Team    Subjective: Bedside symptom check completed. No family or visitors present at time of visit.    Physical exam: Patient resting in bed watching TV at time of visit. Breathing even and non-labored on room air, no excessive secretions noted. Patient without physical or non-verbal signs of pain or discomfort at this time. Patient very hard of hearing but pleasantly conversational.   Assessment and plan: Patient endorses comfort at this time. She reports intermittent back pain from laying in bed but does not want pain medication at this time. She tells this RN that she enjoys watching her game shows, which are on at the moment, declines further visiting. Patient appears stable for transfer to residential hospice this morning, will continue to follow for any changes or advances.    Thank you for allowing the Palliative Medicine Team to assist in the care of this patient.     Damian Leavell, MSN, RN Palliative Medicine Team Team Phone: [phone]  This phone is monitored 7a-7p, please reach out to attending physician outside of these hours for urgent needs.

## 2022-04-25 NOTE — Progress Notes (Signed)
Nephrology brief noted.  Noted patient is now DNR/comfort care with no plan to continue dialysis.  Hospice care.   Sign off, call us back with question.   Katheran James,  CKA

## 2022-04-25 NOTE — Care Management Important Message (Signed)
Important Message  Patient Details  Name: Molly Arroyo MRN: [id number] Date of Birth: September 20, 1946   Medicare Important Message Given:  Other (see comment)     [name] 04/25/2022, 12:50 PM

## 2022-04-25 NOTE — Plan of Care (Signed)
  Problem: Education: Goal: Knowledge of General Education information will improve Description: Including pain rating scale, medication(s)/side effects and non-pharmacologic comfort measures Outcome: Not Applicable   

## 2022-04-25 NOTE — Progress Notes (Signed)
Patient's Sister Peggy's daughter Matilde Bash [phone]) called and stated that Molly Arroyo is patients POA. Peggy's Number is [phone]. She stated that they will not be able to get a hold of Robin but NCM / MD can give her Helene Kelp) or her mother Molly Arroyo tomorrow.

## 2022-04-26 LAB — GLUCOSE, CAPILLARY
Glucose-Capillary: 115 mg/dL — ABNORMAL HIGH (ref 70–99)
Glucose-Capillary: 157 mg/dL — ABNORMAL HIGH (ref 70–99)
Glucose-Capillary: 188 mg/dL — ABNORMAL HIGH (ref 70–99)
Glucose-Capillary: 291 mg/dL — ABNORMAL HIGH (ref 70–99)

## 2022-04-26 NOTE — Progress Notes (Signed)
Patient Molly Arroyo      DOB: [birth year]      WYS:[id number]      Palliative Medicine Team    Subjective: Bedside symptom check completed. No family or visitors bedside at this time.    Physical exam: Patient resting in bed with eyes closed at time of visit. Breathing even and non-labored on room air, no excessive secretions noted. Patient without physical or non-verbal signs of pain or discomfort at this time. This RN did not attempt to wake patient, to encourage comfort and rest.    Assessment and plan: This RN touched base with bedside RN, Mickel Baas, she has no additional needs or concerns today. Discussed with bedside RN that once patient elects to no longer receive dialysis, prognosis is days to weeks. Confirmed that [facility] is expecting transfer to their [facility] residential facility once consents are signed and bed is offered, they directly confirmed it is okay to discharge to hospice with HD catheter and central line in place, per patient wishes. No need for IR procedure for removal, family in agreement. Will continue to follow for any changes or advances.    Thank you for allowing the Palliative Medicine Team to assist in the care of this patient.     Damian Leavell, MSN, RN Palliative Medicine Team Team Phone: [phone]  This phone is monitored 7a-7p, please reach out to attending physician outside of these hours for urgent needs.

## 2022-04-26 NOTE — TOC Progression Note (Addendum)
Transition of Care [facility]) - Progression Note    Patient Details  Name: Lakiya Cottam MRN: [id number] Date of Birth: March 16, 1946  Transition of [facility]) CM/SW Contact  Jacalyn Lefevre Edson Snowball, RN Phone Number: 04/26/2022, 8:23 AM  Clinical Narrative:     Spoke to patient's daughter Shirlean Mylar [phone] . She states she has HPOA. She does want her mother to be transferred to [place].   NCM explained there are no HCPOA paperwork on file.   Explained last evening patient's niece called and provided more contact numbers niece Matilde Bash [phone]  and stated that Vickii Chafe is patients POA. Peggy's Number is [place] needs to get in touch with her and have paperwork completed before her mother can be transferred. There were multiple attempts made yesterday and no answer , voicemail full and no response from text messages. Shirlean Mylar states her phone was not charged yesterday but is today and she will answer.   Shirlean Mylar stated that she or Vickii Chafe will do paperwork for hospice.   Cheri with [facility] aware and calling Shirlean Mylar now    [facility] ready for patient to be transported to [place] . [facility] called. Paperwork in chart  Expected Discharge Plan: [place]    Expected Discharge Plan and Services Expected Discharge Plan: [place]         Expected Discharge Date: 04/25/22                                     Social Determinants of Health (SDOH) Interventions    Readmission Risk Interventions     No data to display

## 2022-04-26 NOTE — Progress Notes (Signed)
Request seen for removal of the HD catheter.   I spoke with Patient's daughter for consent and I explained the procedure to her.   She questioned if it absolutely had to be removed.   When I told her it did not necessarily have to be removed, she decided that she does NOT want her to have ANY additional procedures at all.   She did NOT give consent for removal of the HD cath and would like her transitioned to Hospice with the catheter in place.  I have informed the team.  Judie Grieve Kiarrah Rausch PA-C 04/26/2022 10:01 AM

## 2022-04-26 NOTE — Progress Notes (Signed)
   This referral was received from [facility] for our hospice facility in [place]. I have been able to contact the pt's daughter Shirlean Mylar by phone this am. She is in agreement to hospice services and would like to have her mother transferred to our facility for a focus of comfort end of life care.   Shirlean Mylar is unable to meet me at the hospital to compete the paperwork. She is also unable to meet me at the Hospice facility. She has some health concerns and is not able to drive. She wanted [place] to mail the paperwork to her. However, after discussion she was in agreement with a nurse coming to her home to complete paperwork with her. Her address is [place]. [place] [address].   Once our nurse gets paperwork signed we will be able to move forward with d/c plan.     I have updated TOC. Thank you for this referral and the opportunity to work along with you and your staff. Webb Silversmith RN [phone]

## 2022-04-26 NOTE — Progress Notes (Signed)
Patient seen and examined at the bedside this morning.  No new change in the medical management.  She is waiting to go to residential hospice.  Discharge summary and orders are already in place.  Looks comfortable this morning.

## 2022-07-24 DEATH — deceased

## 2023-02-23 IMAGING — US US RENAL
1 series · 14 of 25 positions shown · non-contrast
Comparison: None.

CLINICAL DATA: Acute on chronic renal disease.

EXAM:
RENAL / URINARY TRACT ULTRASOUND COMPLETE

[Series 1: us renal · 76 acquisitions, 14 frames shown]
[im 1/76]
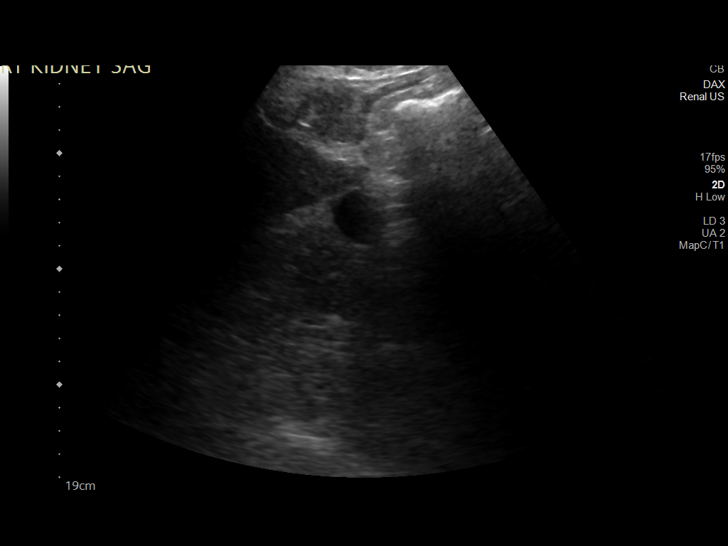
[im 7/76]
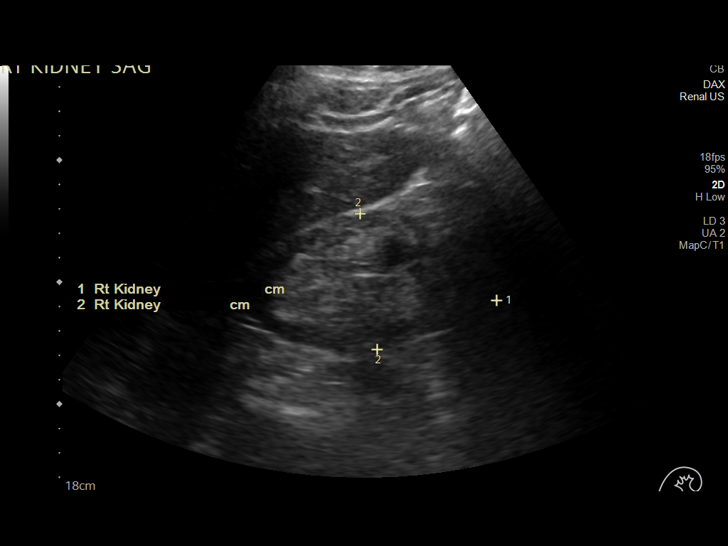
[im 13/76]
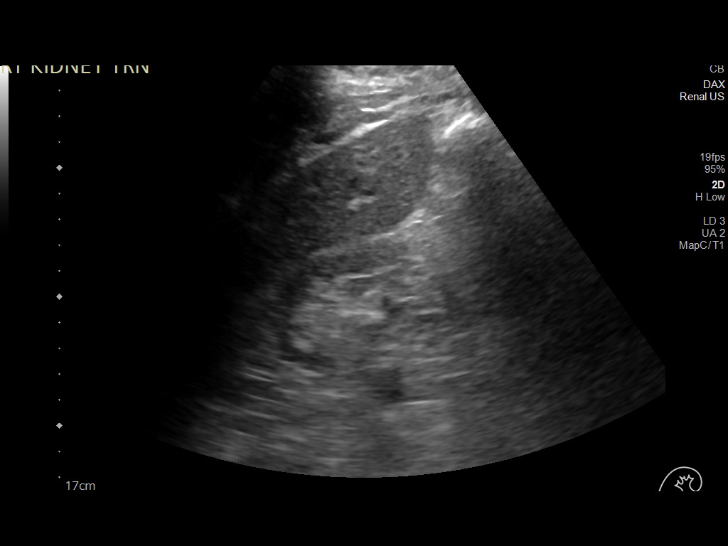
[im 19/76]
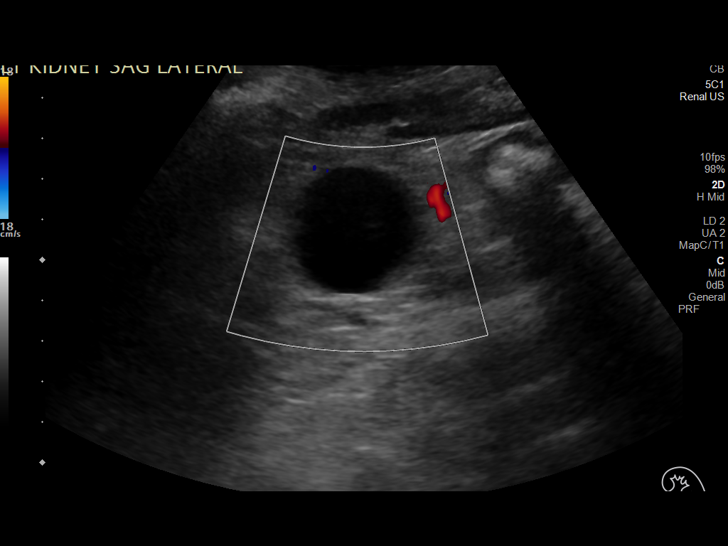
[im 26/76]
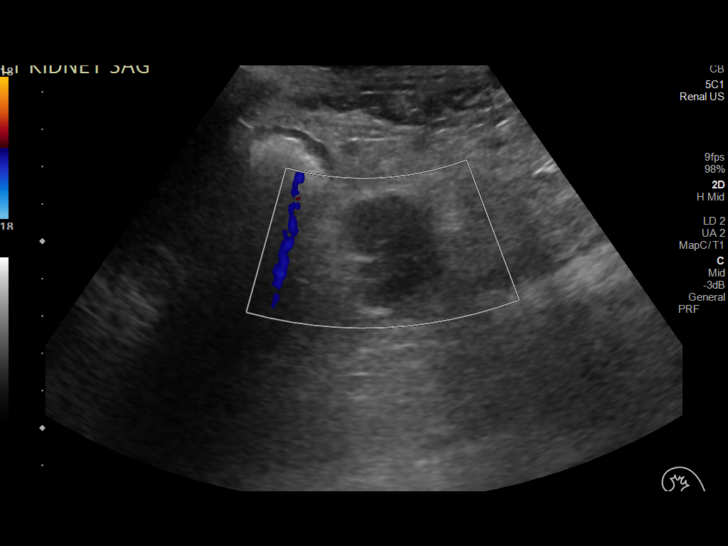
[im 29/76]
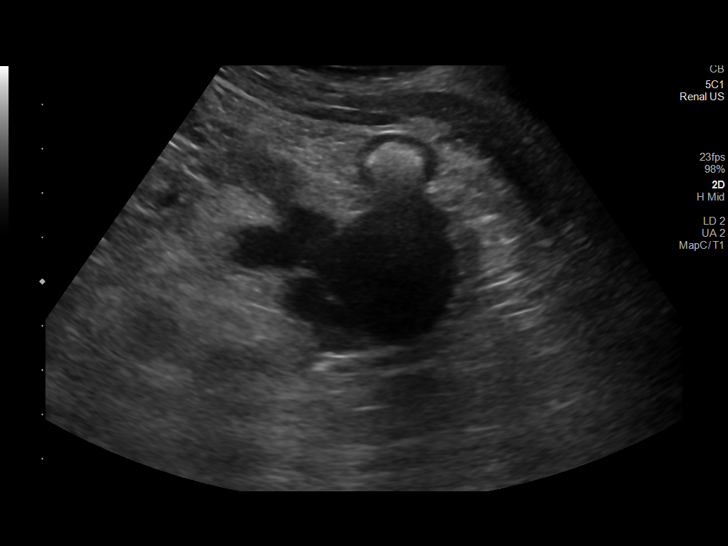
[im 35/76]
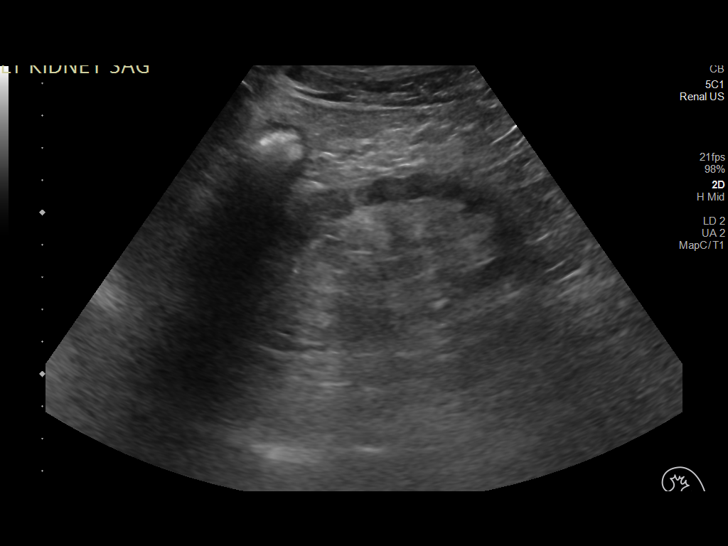
[im 41/76]
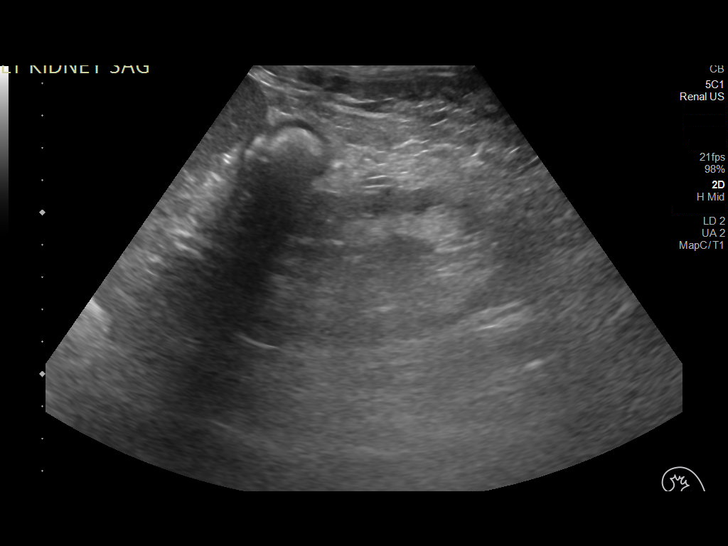
[im 47/76]
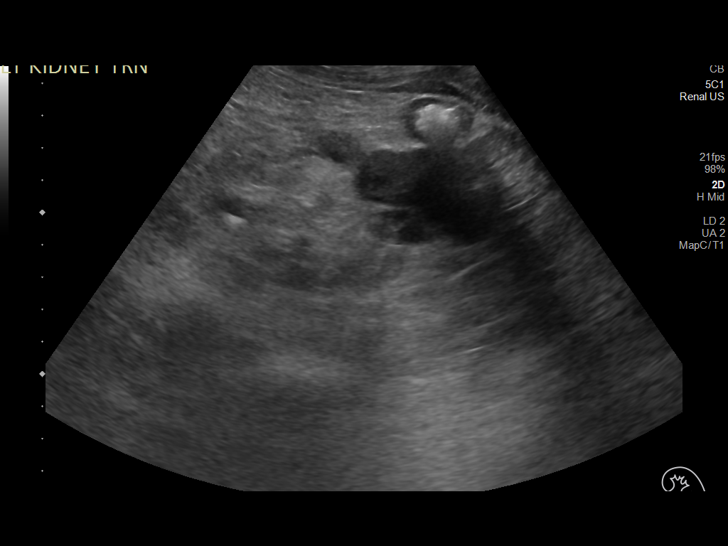
[im 51/76]
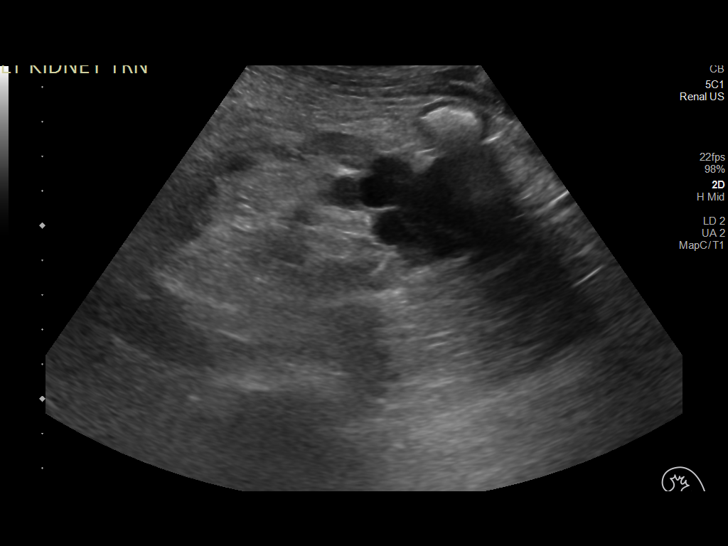
[im 57/76]
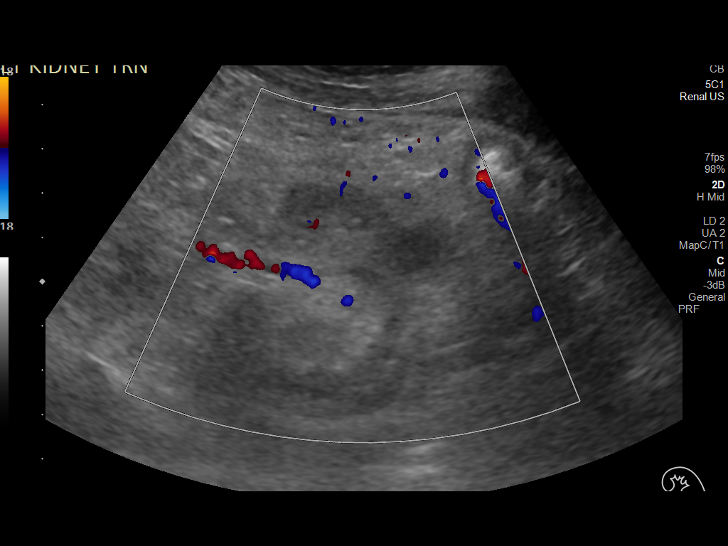
[im 63/76]
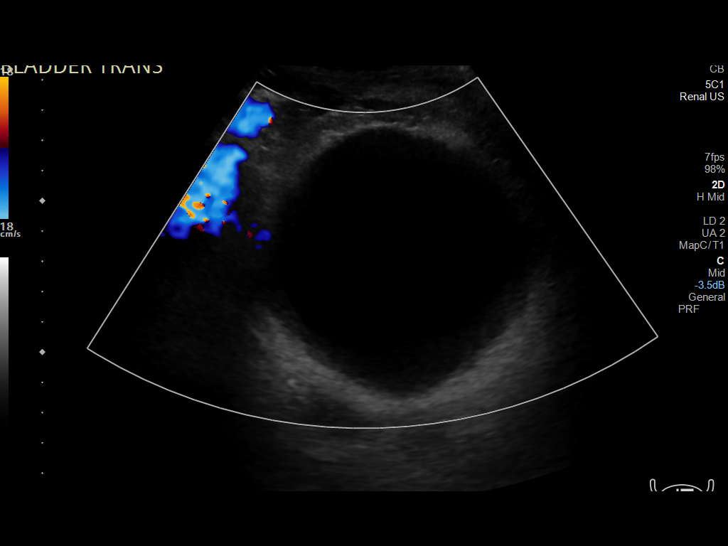
[im 69/76]
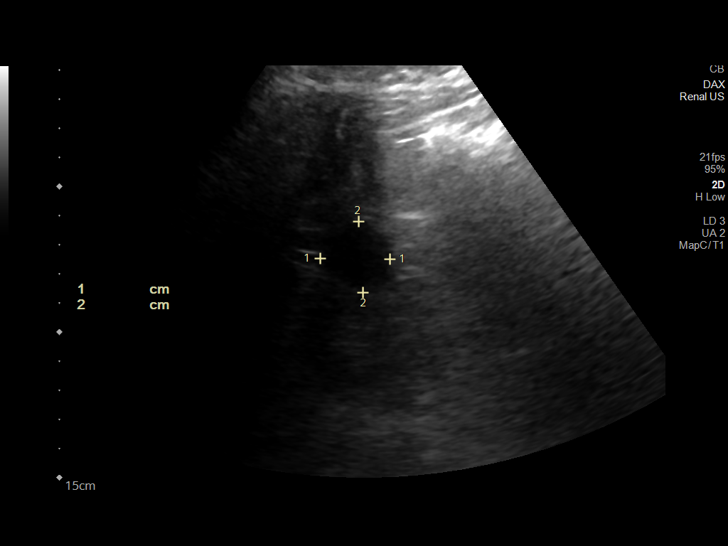
[im 76/76]
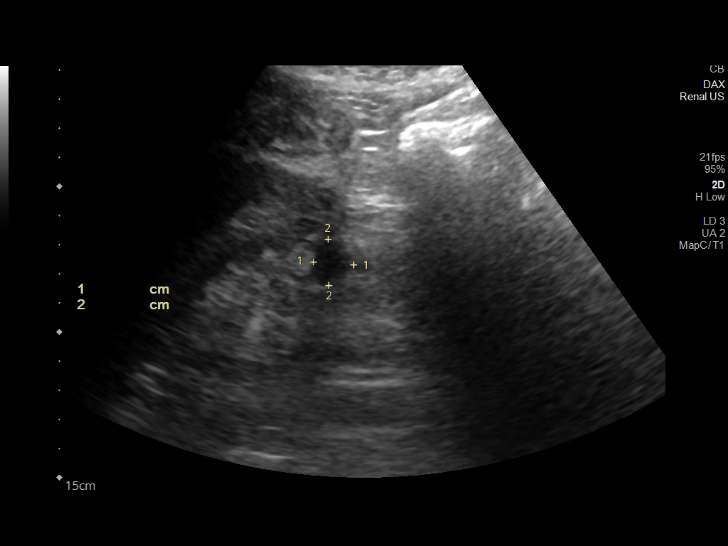

[14 of 25 positions shown; findings below may reference images not displayed]

FINDINGS: Right Kidney:

Renal measurements: 11.3 x 5.6 x 5.2 cm = volume: 171 mL. Renal
cortical thinning. Echogenicity is increased. Couple of simple renal
cysts measuring up to 2.6 cm are noted. No hydronephrosis
visualized.

Left Kidney:

Renal measurements: 10 x 4.7 x 5.7 cm = volume: 139 mL. Echogenicity
is increased. There is a multiseptated and lobulated poorly
visualized 5.1 x 3.6 x 3.1 cm cystic lesion which could represent
several separate cysts. Question associated mural nodularity. No
hydronephrosis visualized.

Urinary bladder:

Appears normal for degree of bladder distention.

Other:

None.
IMPRESSION: 1. Multiseptated and lobulated poorly visualized 5.1 x 3.6 x 3.1 cm
left renal cystic lesion which could represent several separate
cysts some of which are complex in appearance. Comparison with prior
cross-sectional imaging would be of value. If not available,
recommend nonemergent MRI with renal protocol further evaluation.
2. Increased renal parenchymal echogenicity suggestive of renal
parenchymal disease.

## 2023-02-23 IMAGING — MR MR FOOT*R* W/O CM
4 of 5 series · 19 of 40 positions shown · non-contrast
Comparison: Right foot radiograph 02/11/2021

CLINICAL DATA: Osteomyelitis, foot

EXAM:
MRI OF THE RIGHT FOREFOOT WITHOUT CONTRAST
TECHNIQUE: Multiplanar, multisequence MR imaging of the right forefoot was
performed. No intravenous contrast was administered.

[Series 3: T1 · oblique · 3.0mm · 0.29mm/px · 3 of 46 slices shown (1 of 2)]
[im 6/46]
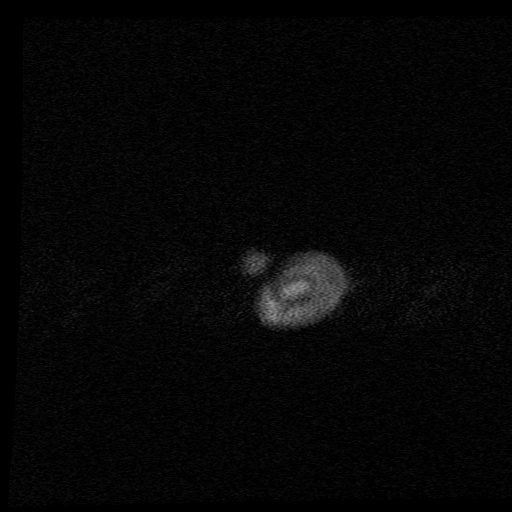
[im 26/46]
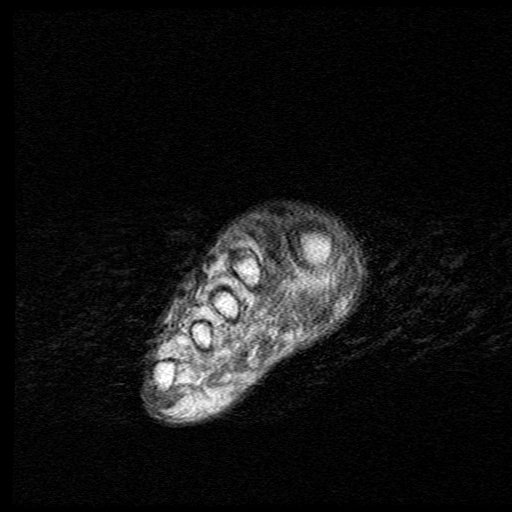
[im 41/46]
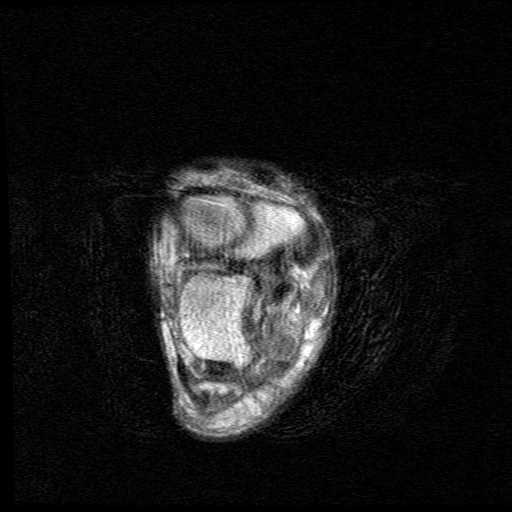

[Series 4: T2 fat-sat · oblique · 3.0mm · 0.29mm/px · 10 of 46 slices shown (1 of 2)]
[im 1/46]
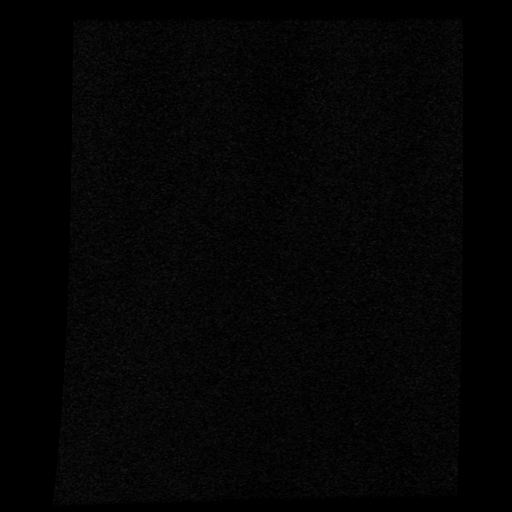
[im 5/46]
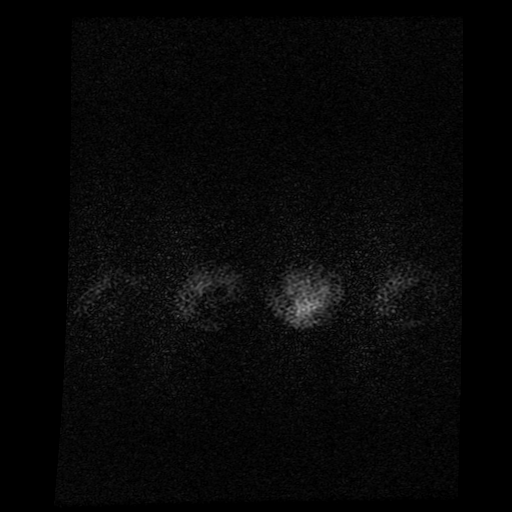
[im 10/46]
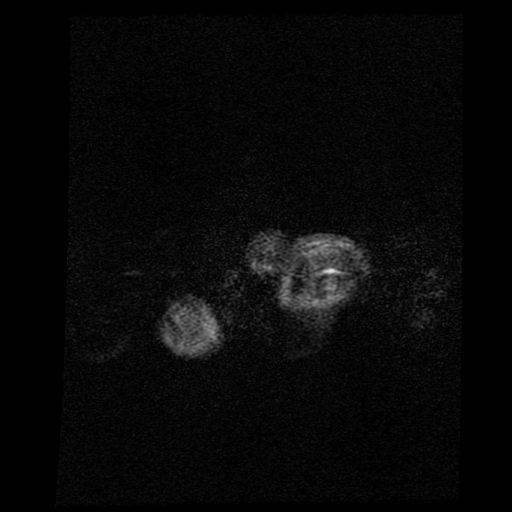
[im 14/46]
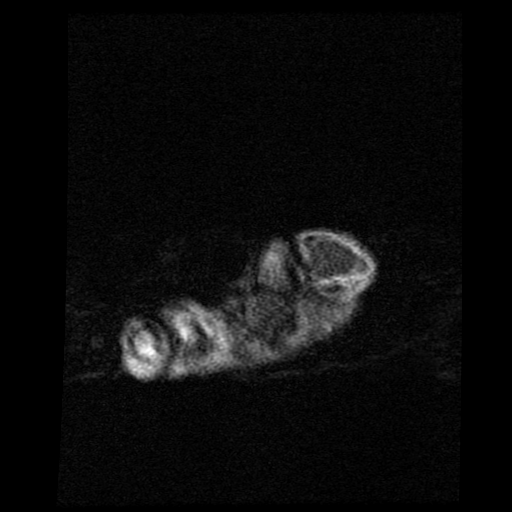
[im 19/46]
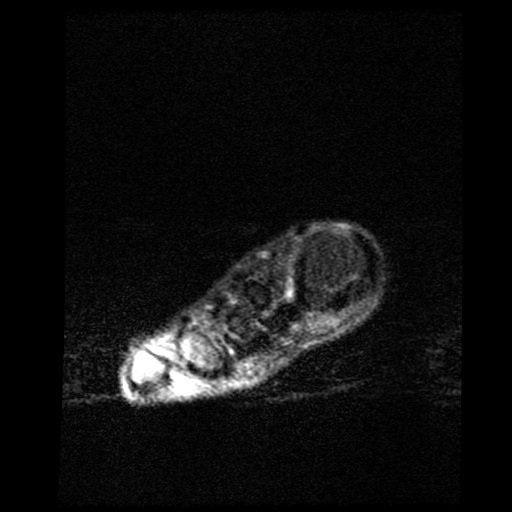
[im 23/46]
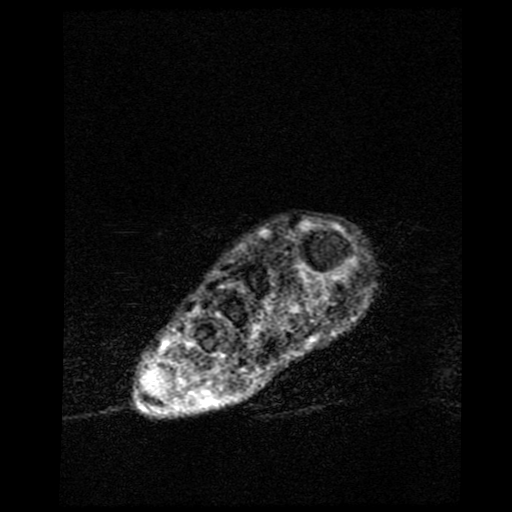
[im 28/46]
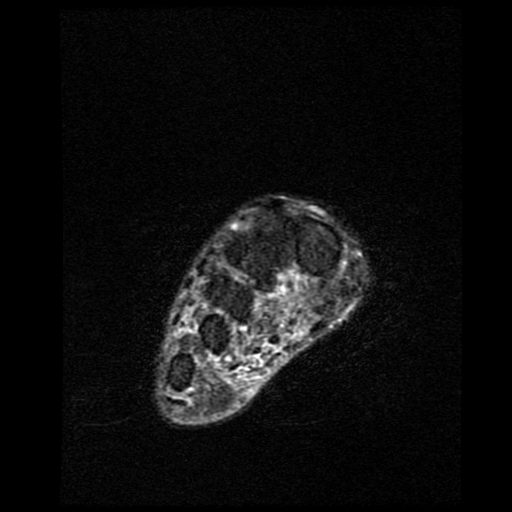
[im 32/46]
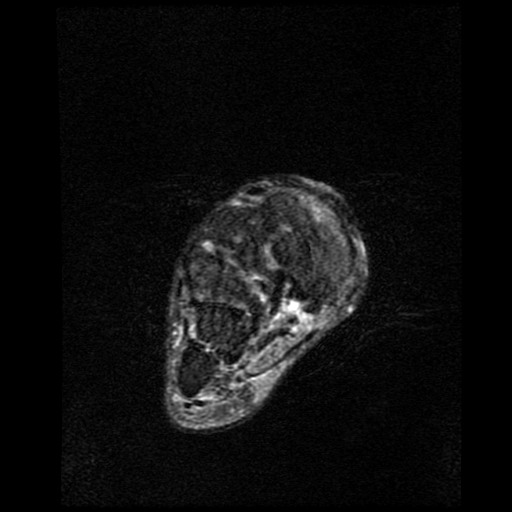
[im 37/46]
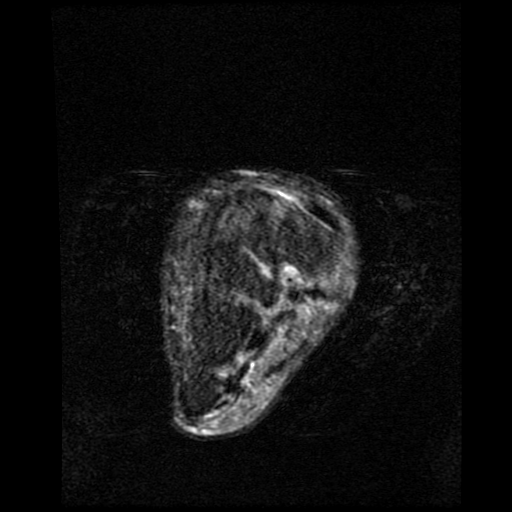
[im 41/46]
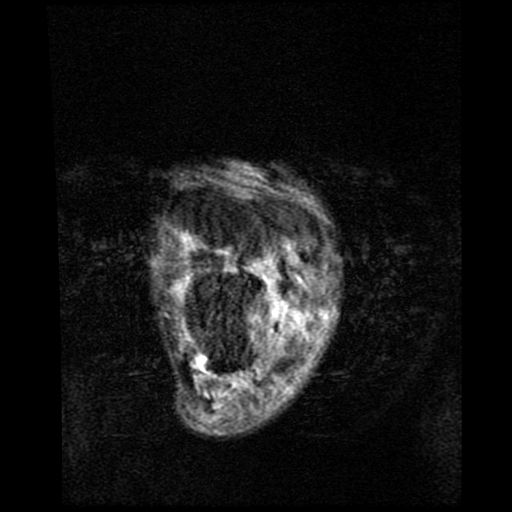

[Series 7: T1 · oblique · 3.0mm · 0.31mm/px · 3 of 26 slices shown (2 of 2)]
[im 6/26]
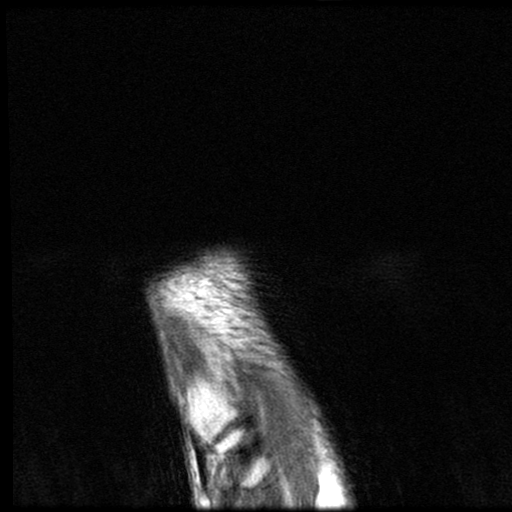
[im 16/26]
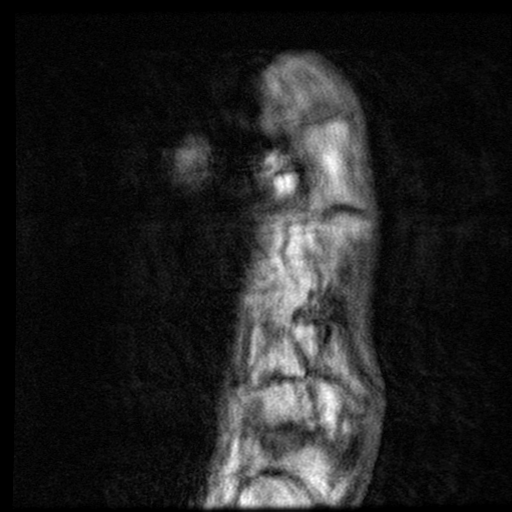
[im 26/26]
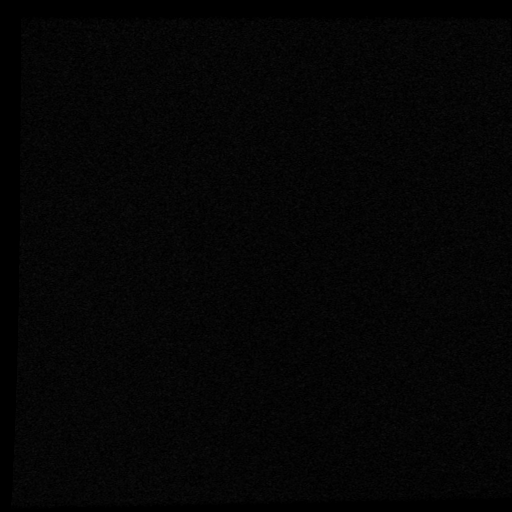

[Series 9: T2 fat-sat · oblique · 3.0mm · 0.31mm/px · 3 of 26 slices shown (2 of 2)]
[im 6/26]
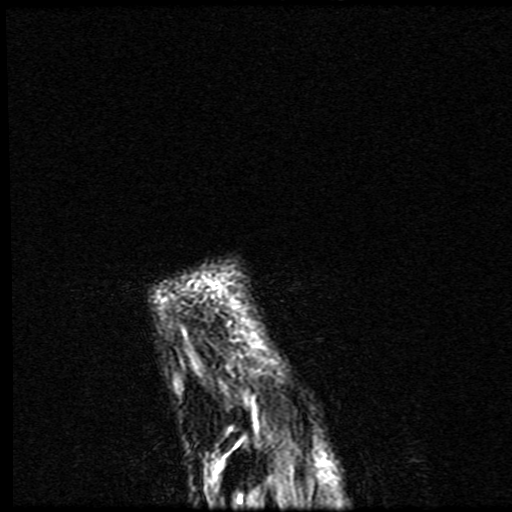
[im 16/26]
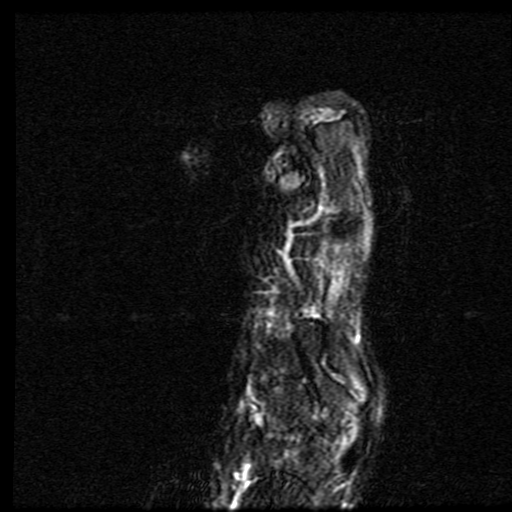
[im 26/26]
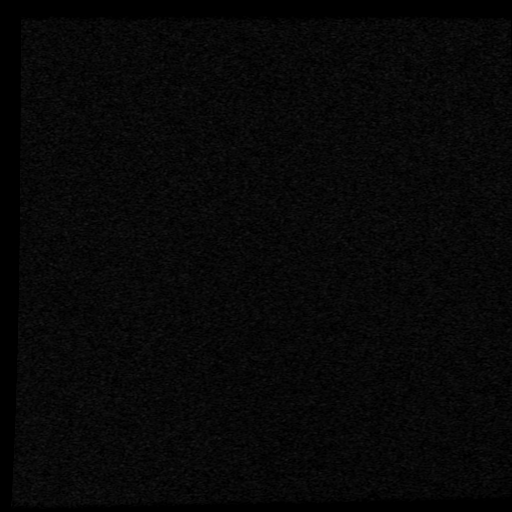

[19 of 40 positions shown; findings below may reference images not displayed]

FINDINGS: Motion degraded exam.

Bones/Joint/Cartilage

Prior amputation of the third toe. There is increased signal within
the great toe distal phalanx with likely mild low T1 signal,
difficult to fully evaluate due to motion artifact. There is
moderate-severe tarsometatarsal joint degenerative arthritis.

Ligaments

Poorly evaluated due to motion artifact. Ossification along the
Lisfranc ligament.

Muscles and Tendons

Intramuscular edema in the foot commonly seen in diabetes. Mild
muscle atrophy.

Soft tissues

Mild generalized soft tissue swelling. Likely ulcer adjacent to the
great toe distal phalanx as seen on recent radiograph.
IMPRESSION: Findings compatible with osteomyelitis of the great toe distal
phalanx with adjacent soft tissue ulcer.
